# Patient Record
Sex: Male | Born: 1955
Health system: Southern US, Community
[De-identification: ages and names within clinical notes are randomized; demographics above are authoritative.]

## PROBLEM LIST (undated history)

## (undated) DIAGNOSIS — J45909 Unspecified asthma, uncomplicated: Secondary | ICD-10-CM

## (undated) DIAGNOSIS — R011 Cardiac murmur, unspecified: Secondary | ICD-10-CM

## (undated) DIAGNOSIS — H9191 Unspecified hearing loss, right ear: Secondary | ICD-10-CM

## (undated) DIAGNOSIS — I340 Nonrheumatic mitral (valve) insufficiency: Secondary | ICD-10-CM

## (undated) DIAGNOSIS — K219 Gastro-esophageal reflux disease without esophagitis: Secondary | ICD-10-CM

## (undated) DIAGNOSIS — I341 Nonrheumatic mitral (valve) prolapse: Secondary | ICD-10-CM

## (undated) DIAGNOSIS — I10 Essential (primary) hypertension: Secondary | ICD-10-CM

## (undated) DIAGNOSIS — Z974 Presence of external hearing-aid: Secondary | ICD-10-CM

## (undated) DIAGNOSIS — J309 Allergic rhinitis, unspecified: Secondary | ICD-10-CM

## (undated) DIAGNOSIS — T7840XA Allergy, unspecified, initial encounter: Secondary | ICD-10-CM

## (undated) HISTORY — PX: OSSICULAR RECONSTRUCTION: SHX2135

## (undated) HISTORY — DX: Cardiac murmur, unspecified: R01.1

## (undated) HISTORY — PX: STAPEDECTOMY: SHX2435

## (undated) HISTORY — DX: Nonrheumatic mitral (valve) insufficiency: I34.0

## (undated) HISTORY — PX: APPENDECTOMY: SHX54

## (undated) HISTORY — PX: OTHER SURGICAL HISTORY: SHX169

## (undated) HISTORY — DX: Allergic rhinitis, unspecified: J30.9

## (undated) HISTORY — DX: Allergy, unspecified, initial encounter: T78.40XA

## (undated) HISTORY — DX: Essential (primary) hypertension: I10

## (undated) HISTORY — DX: Unspecified hearing loss, right ear: H91.91

## (undated) HISTORY — DX: Nonrheumatic mitral (valve) prolapse: I34.1

## (undated) HISTORY — PX: EYE SURGERY: SHX253

## (undated) HISTORY — PX: HERNIA REPAIR: SHX51

## (undated) HISTORY — PX: TONSILLECTOMY: SUR1361

---

## 1990-03-21 ENCOUNTER — Encounter: Payer: Self-pay | Admitting: Cardiovascular Disease

## 1990-04-02 HISTORY — PX: OTHER SURGICAL HISTORY: SHX169

## 2000-01-26 ENCOUNTER — Other Ambulatory Visit: Admission: RE | Admit: 2000-01-26 | Discharge: 2000-01-26 | Payer: Self-pay | Admitting: Otolaryngology

## 2000-01-26 ENCOUNTER — Encounter (INDEPENDENT_AMBULATORY_CARE_PROVIDER_SITE_OTHER): Payer: Self-pay | Admitting: Specialist

## 2000-06-21 HISTORY — PX: OTHER SURGICAL HISTORY: SHX169

## 2006-04-02 HISTORY — PX: OTHER SURGICAL HISTORY: SHX169

## 2006-07-26 ENCOUNTER — Ambulatory Visit: Payer: Self-pay

## 2006-07-26 ENCOUNTER — Encounter: Payer: Self-pay | Admitting: Cardiology

## 2006-07-26 HISTORY — PX: OTHER SURGICAL HISTORY: SHX169

## 2006-09-19 ENCOUNTER — Ambulatory Visit (HOSPITAL_BASED_OUTPATIENT_CLINIC_OR_DEPARTMENT_OTHER): Admission: RE | Admit: 2006-09-19 | Discharge: 2006-09-19 | Payer: Self-pay | Admitting: Otolaryngology

## 2006-12-24 ENCOUNTER — Telehealth: Payer: Self-pay | Admitting: Family Medicine

## 2007-01-09 ENCOUNTER — Ambulatory Visit: Payer: Self-pay | Admitting: Cardiology

## 2007-02-24 ENCOUNTER — Ambulatory Visit: Payer: Self-pay | Admitting: Family Medicine

## 2007-02-24 DIAGNOSIS — G589 Mononeuropathy, unspecified: Secondary | ICD-10-CM | POA: Insufficient documentation

## 2007-02-24 LAB — CONVERTED CEMR LAB
ALT: 30 units/L (ref 0–53)
AST: 39 units/L — ABNORMAL HIGH (ref 0–37)
Basophils Relative: 0.3 % (ref 0.0–1.0)
Bilirubin, Direct: 0.1 mg/dL (ref 0.0–0.3)
CO2: 25 meq/L (ref 19–32)
Calcium: 10 mg/dL (ref 8.4–10.5)
Chloride: 105 meq/L (ref 96–112)
Creatinine, Ser: 0.9 mg/dL (ref 0.4–1.5)
Eosinophils Absolute: 0.2 10*3/uL (ref 0.0–0.6)
Eosinophils Relative: 2.8 % (ref 0.0–5.0)
GFR calc non Af Amer: 95 mL/min
Glucose, Bld: 83 mg/dL (ref 70–99)
HCT: 43.5 % (ref 39.0–52.0)
HDL: 65 mg/dL (ref >39.0)
Platelets: 269 10*3/uL (ref 150–400)
RBC: 4.66 M/uL (ref 4.22–5.81)
RDW: 12.4 % (ref 11.5–14.6)
Total Bilirubin: 1.4 mg/dL — ABNORMAL HIGH (ref 0.3–1.2)
Triglycerides: 124 mg/dL (ref 0–149)
VLDL: 25 mg/dL (ref 0–40)
WBC: 6.1 10*3/uL (ref 4.5–10.5)

## 2007-08-12 ENCOUNTER — Encounter: Payer: Self-pay | Admitting: Family Medicine

## 2007-10-21 ENCOUNTER — Ambulatory Visit: Payer: Self-pay | Admitting: Family Medicine

## 2007-11-11 ENCOUNTER — Ambulatory Visit: Payer: Self-pay | Admitting: Internal Medicine

## 2007-11-17 ENCOUNTER — Ambulatory Visit: Payer: Self-pay | Admitting: Internal Medicine

## 2007-11-17 HISTORY — PX: COLONOSCOPY: SHX174

## 2007-12-25 LAB — CONVERTED CEMR LAB: PSA: 0.7 ng/mL (ref 0.10–4.00)

## 2008-01-06 LAB — HM COLONOSCOPY: HM Colonoscopy: NORMAL

## 2008-06-15 ENCOUNTER — Encounter: Payer: Self-pay | Admitting: Cardiology

## 2008-06-15 ENCOUNTER — Ambulatory Visit: Payer: Self-pay

## 2008-11-25 ENCOUNTER — Telehealth: Payer: Self-pay | Admitting: Family Medicine

## 2008-11-25 DIAGNOSIS — J309 Allergic rhinitis, unspecified: Secondary | ICD-10-CM | POA: Insufficient documentation

## 2008-11-25 DIAGNOSIS — L439 Lichen planus, unspecified: Secondary | ICD-10-CM

## 2008-11-25 HISTORY — DX: Lichen planus, unspecified: L43.9

## 2008-11-25 HISTORY — DX: Allergic rhinitis, unspecified: J30.9

## 2009-06-15 HISTORY — PX: OTHER SURGICAL HISTORY: SHX169

## 2009-06-21 ENCOUNTER — Telehealth: Payer: Self-pay | Admitting: Family Medicine

## 2009-07-19 ENCOUNTER — Ambulatory Visit: Payer: Self-pay | Admitting: Family Medicine

## 2009-07-20 LAB — CONVERTED CEMR LAB
BUN: 16 mg/dL (ref 6–23)
CO2: 27 meq/L (ref 19–32)
Calcium: 9.7 mg/dL (ref 8.4–10.5)
Chloride: 105 meq/L (ref 96–112)
GFR calc non Af Amer: 94.07 mL/min (ref >60)
PSA: 0.83 ng/mL (ref 0.10–4.00)

## 2010-05-02 NOTE — Letter (Signed)
Summary: [name] Staff/Volunteer Medical Form  [facility] Staff/Volunteer Medical Form   Imported By: Beau Fanny 07/19/2009 14:45:17  _____________________________________________________________________  External Attachment:    Type:   Image     Comment:   External Document

## 2010-05-02 NOTE — Progress Notes (Signed)
Summary:  FEXOFENADINE  Phone Note Refill Request Message from:  Patient on June 21, 2009 9:00 AM  Refills Requested: Medication #1:  FEXOFENADINE HCL 180 MG  TABS 1 daily by mouth as needed.  Method Requested: Electronic Initial call taken by: Mervin Hack CMA Duncan Dull),  June 21, 2009 9:00 AM  Follow-up for Phone Call        Rx faxed to pharmacy Follow-up by: Mervin Hack CMA Duncan Dull),  June 21, 2009 9:01 AM    Prescriptions: FEXOFENADINE HCL 180 MG  TABS (FEXOFENADINE HCL) 1 daily by mouth as needed  #30 x 11   Entered by:   Mervin Hack CMA (AAMA)   Authorized by:   Shaune Leeks MD   Signed by:   Mervin Hack CMA (AAMA) on 06/21/2009   Method used:   Electronically to        [facility]* (retail)       [address]       [place], [place]  [address]       Ph: [phone]       Fax: [phone]   RxID:   [id number]

## 2010-05-02 NOTE — Assessment & Plan Note (Signed)
Summary: CPX/CLE   Vital Signs:  Patient profile:   55 year old male Height:      73 inches Weight:      202.13 pounds BMI:     26.76 Temp:     97.8 degrees F oral Pulse rate:   68 / minute Pulse rhythm:   regular BP sitting:   128 / 78  (left arm) Cuff size:   regular  Vitals Entered By: Sydell Axon LPN (July 19, 2009 2:08 PM) CC: 30 Minute checkup, had a colonoscopy 08/09 by Dr. Marina Goodell   History of Present Illness: Pt here for Comp Exam, no complaints. He has significant seasonal allergies. He has been using all three OTC meds and is using Singulair with good results.  Preventive Screening-Counseling & Management  Alcohol-Tobacco     Smoking Status: never  Allergies: No Known Drug Allergies  Past History:  Past Surgical History: Tonsillectomy age 76 Ossicular reconstruction of the right ear Echo- positive for MVP, markedly myxomatous, marked prolapse  TVP with Regurg (03/1990) Bilateral laparoscopic hernia repairs (1992) Echo- mitral valve thickened, myxomatous vs degenerative, moderate MVP, moderate MR (06/21/2000) Echo- LVF normal, EF 55-65%, Triv  AR mod MR, marked posterior leaflet MVP (07/26/2006) COLONOSCOPY NML (DR PERRY) 11/17/07     10 YRS Nasal Septoplasty (Dr Jearld Fenton) 2008 ECHO No change (EF 55-60% Mild Myxomatous Mitral Valve   Mod MVP  Mod MR   Mild LAE  (06/15/2009)  Family History: Father: A 85 diabetes9diet controlled) , bypass graft polyps Vascular Dz Mother: A 82 hypertension, breast cancer, polyps Brother A 14 Sister A 54 Hyperparathyroidism Sister A 50 Breast Ca  Review of Systems General:  Denies chills, fatigue, fever, sweats, weakness, and weight loss. Eyes:  Denies blurring, discharge, and eye pain. ENT:  Complains of decreased hearing; denies ear discharge and ringing in ears; mild chronic. CV:  Denies chest pain or discomfort, fainting, fatigue, palpitations, shortness of breath with exertion, swelling of feet, and swelling of hands. Resp:   Denies cough, shortness of breath, and wheezing; seasonal wheezing occas with allergies none this year.Marland Kitchen GI:  Denies abdominal pain, bloody stools, change in bowel habits, constipation, dark tarry stools, diarrhea, indigestion, loss of appetite, nausea, vomiting, vomiting blood, and yellowish skin color. GU:  Denies discharge, dysuria, nocturia, and urinary frequency. MS:  Complains of joint pain and muscle aches; denies low back pain, cramps, and stiffness; after workouts. Derm:  Denies dryness, itching, and rash. Neuro:  Denies numbness, poor balance, tingling, and tremors.  Physical Exam  General:  Well-developed,well-nourished,in no acute distress; alert,appropriate and cooperative throughout examination Head:  Normocephalic and atraumatic without obvious abnormalities. No apparent alopecia but mild male pattern  balding. Eyes:  Conjunctiva clear bilaterally.  Ears:  External ear exam shows no significant lesions or deformities.  Otoscopic examination reveals clear canals, tympanic membranes are intact bilaterally without bulging, retraction, inflammation or discharge. Hearing is grossly normal bilaterally. Nose:  External nasal examination shows no deformity or inflammation. Nasal mucosa are pink and moist without lesions or exudates.  Mouth:  Oral mucosa and oropharynx without lesions or exudates.  Teeth in good repair. Neck:  No deformities, masses, or tenderness noted. Chest Wall:  No deformities, masses, tenderness or gynecomastia noted. Breasts:  No masses or gynecomastia noted Lungs:  Normal respiratory effort, chest expands symmetrically. Lungs are clear to auscultation, no crackles or wheezes. Heart:  Normal rate and regular rhythm. S1 and S2 normal without gallop, click, rub or other extra sounds. II-III/VI Sys  murmur heard best LLSB but precordially throughout Abdomen:  Bowel sounds positive,abdomen soft and non-tender without masses, organomegaly or hernias noted. Rectal:   Declined Genitalia:  Testes bilaterally descended without nodularity, tenderness or masses. No scrotal masses or lesions. No penis lesions or urethral discharge. Prostate:  Declined Msk:  No deformity or scoliosis noted of thoracic or lumbar spine.   Pulses:  R and L carotid,radial,femoral,dorsalis pedis and posterior tibial pulses are full and equal bilaterally Extremities:  No clubbing, cyanosis, edema, or deformity noted with normal full range of motion of all joints.   Neurologic:  No cranial nerve deficits noted. Station and gait are normal. Plantar reflexes are down-going bilaterally. DTRs are symmetrical throughout. Sensory, motor and coordinative functions appear intact. Skin:  Intact without suspicious lesions or rashes Cervical Nodes:  No lymphadenopathy noted Inguinal Nodes:  No significant adenopathy Psych:  Cognition and judgment appear intact. Alert and cooperative with normal attention span and concentration. No apparent delusions, illusions, hallucinations   Impression & Recommendations:  Problem # 1:  HEALTH MAINTENANCE EXAM (ICD-V70.0)  Orders: TLB-Renal Function Panel (80069-RENAL)  Reviewed preventive care protocols, scheduled due services, and updated immunizations.  Problem # 2:  DEVIATED  SEPTUM/ NASAL HYPERTROPHY (ICD-470) Assessment: Improved Had septoplasty 2008 with significant improvement.  Problem # 3:  UNSPEC HEARING LOSS ( FUNCTIONAL DEAFNESS AD) (ICD-389.9) Assessment: Unchanged Stable without worsening.  Problem # 4:  MITRAL REGURGITATION (ECHO PROVEN) (ICD-396.3) Assessment: Unchanged Recent ECHO actually minimally improved. Certainly no progression.  Problem # 5:  ALLERGIC RHINITIS (ICD-477.9) Assessment: Improved  Taking all three OTC antihistamines and usin Singulair with good results. His updated medication list for this problem includes:    Fexofenadine Hcl 180 Mg Tabs (Fexofenadine hcl) .Marland Kitchen... 1 daily by mouth as needed    Loratadine 10  Mg Tabs (Loratadine) .Marland Kitchen... Take 2 by mouth daily as needed    Zyrtec Allergy 10 Mg Tabs (Cetirizine hcl) .Marland Kitchen... Take one by mouth daily as needed  Discussed use of allergy medications and environmental measures.   Problem # 6:  SPECIAL SCREENING MALIGNANT NEOPLASM OF PROSTATE (ICD-V76.44) Assessment: Unchanged PSA drawn today. Orders: TLB-PSA (Prostate Specific Antigen) (84153-PSA)  Problem # 7:  MONONEURITIS OF UNSPECIFIED SITE (ICD-355.9) Assessment: Unchanged Stable.  Complete Medication List: 1)  Fexofenadine Hcl 180 Mg Tabs (Fexofenadine hcl) .Marland Kitchen.. 1 daily by mouth as needed 2)  Loratadine 10 Mg Tabs (Loratadine) .... Take 2 by mouth daily as needed 3)  Zyrtec Allergy 10 Mg Tabs (Cetirizine hcl) .... Take one by mouth daily as needed 4)  Singulair 10 Mg Tabs (Montelukast sodium) .... Take one by mouth daily 5)  Ibuprofen 800 Mg Tabs (Ibuprofen) .... Take one by mouth at bedtime  Patient Instructions: 1)  RTC as needed. 2)  Form for [employer] signed.  Current Allergies (reviewed today): No known allergies

## 2010-08-15 NOTE — Assessment & Plan Note (Signed)
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   I evaluated [name] informally in 2002 at which time he had a 2D  echocardiogram.  This showed a thickened mitral valve with a hooded and  redundant myxomatous character.  There was moderate mitral valve  prolapse.  He had moderate mitral regurgitation at the time.  Left  ventricular size was normal as was function and his left atrial size was  also minimally dilated at 4.07 sonometers.   He is totally asymptomatic.  He is in the best cardiovascular shape  ever, he says.  He bicycles, plays full court basketball with 20-year-  olds, treadmills and lifts weights.  He has no symptoms whatsoever.  He  has had no palpitations, shortness of breath, dyspnea on exertion,  orthopnea, PND, peripheral edema, presyncope or syncope.   PAST MEDICAL HISTORY:  HE HAS NO KNOWN DYE ALLERGIES.  HE IS NOT  ALLERGIC TO ANY MEDICATIONS.   He does not smoke.  He does drink alcohol infrequently.   He is on ibuprofen 800 mg nightly.  He takes loratadine p.r.n. for  allergies.   SURGICAL HISTORY:  1. Appendectomy 1971.  2. T&A in 1961.  3. Multiple ear procedures.  4. Bilateral inguinal hernia repairs in 1992.   SOCIAL HISTORY:  He is a Development worker, community.  He follows mostly the [facility]  Diet.  He is married and has 2 children.   REVIEW OF SYSTEMS:  Other than some cold induced asthma and some  allergies is totally negative.   EXAMINATION:  He is in acute distress, he is alert and oriented.  Blood  pressure is 140/76, his pulse is 62 and regular, his weight is 191, he  is 6 foot 2 inches.  HEENT:   Normocephalic/atraumatic, PERRLA, facial symmetry is normal.  Carotid upstrokes were equal bilaterally without obvious bruit.  He does  have some low grade early systolic sounds at the base of his neck.  Thyroid is not enlarged, trachea is midline.  LUNGS:  Clear.  HEART:  Reveals a nondisplaced PMI.  He has a very loud 3/6 systolic  murmur.  There was no diastolic inflow murmur.  It was difficult to hear  S2 split.  There is no gallop.  ABDOMINAL:  Soft, good bowel sounds.  EXTREMITIES:  Reveal no edema, pulses are intact.  NEURO:  Exam is grossly intact.   EKG shows sinus rhythm with early repolarization.   A 2D echocardiogram was repeated on July 26, 2006.  This has been  reviewed with [name] and also [name], 2 of my  colleagues.  His left ventricular end systolic dimension is 26.4 mm, his  left atrial size is increased to 45 mm, he has  moderate mitral valve  regurgitation.  From the long axis view it appears to be in the moderate  to severe range.  He has marked mitral valve prolapse, particularly of  the posterior leaflet.  His anterior leaflet also prolapses.  He has  myxomatous degeneration as pointed out earlier.  There is no pulmonary  hypertension.   ASSESSMENT AND PLAN:  I have had about a 20 minute discussion with Dr.  [name].  He admits to being conservative by nature and would like not to  have to have surgery any sooner than is necessary.  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  We talked about various options here in [place] for  mitral valve repair.  He prefers a minimally invasive procedure as  opposed to sternotomy.   At the present time he would like to proceed with watchful waiting which  I agree with.  I have advised an annual transthoracic echo for close  observation.  I do not think he will become symptomatic but if he does  he will let me know.   Will schedule followup in 1  year.     Ryan C. Daleen Squibb, MD, [facility]  Electronically Signed    TCW/MedQ  DD: 01/09/2007  DT: 01/09/2007  Job #: [id number]   cc:   Ryan Silence, MD

## 2010-08-15 NOTE — Op Note (Signed)
[name], [name]              ACCOUNT NO.:  [id number]   MEDICAL RECORD NO.:  [id number]          PATIENT TYPE:  AMB   LOCATION:  DSC                          FACILITY:  [facility]   PHYSICIAN:  Suzanna Obey, M.D.       DATE OF BIRTH:  [birth year]   DATE OF PROCEDURE:  09/19/2006  DATE OF DISCHARGE:                               OPERATIVE REPORT   PREOPERATIVE DIAGNOSIS:  Deviated septum and turbinate hypertrophy.   POSTOPERATIVE DIAGNOSIS:  Deviated septum and turbinate hypertrophy.   SURGICAL PROCEDURE:  Septoplasty and submucous resection of inferior  turbinate.   ANESTHESIA:  General.   ESTIMATED BLOOD LOSS:  Approximately 10 mL.   INDICATION:  This is a 55 year old who has had significant nasal  obstructions and congestion that has been refractory to medical therapy.  He is ready to proceed with surgery.  He was informed of the risks and  benefits of the procedure as well as options.  All his questions were  answered and consent was obtained.   OPERATION:  The patient was taken to the operating room and placed in  the supine position.  After adequate general endotracheal tube  anesthesia, was placed in the supine position, prepped and draped in the  usual sterile manner.  The oxymetazoline pledgets were placed into the  nose bilaterally and then the septum and inferior turbinates were  injected with 1% lidocaine with 1:100,000 epinephrine.  The right  hemitransfixion incision was performed, raising the mucoperichondrial  and ostial flap.  The cartilage and septum were severely deviated to the  right.  The cartilage was removed with the freer elevator posterior to  the 2-cm strut.  The deviated portion of the bone removed with the  Jansen-Middleton forceps.  Once this bone was trimmed as well as the  cartilage, this corrected the septal deflection.  The turbinates were  infractured.  A midline incision made with a 15 blade.  The mucosal flap  elevated superiorly and the  inferior mucosa and bone were removed with a  turbinate scissors.  The edge was cauterized with the suction cautery  and both flaps were laid down over the raw surface and outfractured.  Hemitransfixion incision closed with interrupted 4-0 chromic and a 4-0  plain gut quilting stitch placed through the septum.  The nasopharynx  and oral cavity and oropharynx were suctioned out of all blood and  debris under direct visualization.  The Telfa roll soaked in bacitracin  were placed into the nose bilaterally and secured with a 3-0 nylon.  The  patient was awakened and brought to recovery in stable condition.  Counts were correct.           ______________________________  Suzanna Obey, M.D.     JB/MEDQ  D:  09/19/2006  T:  09/19/2006  Job:  [id number]

## 2010-09-14 ENCOUNTER — Encounter: Payer: Self-pay | Admitting: Cardiovascular Disease

## 2010-12-28 DIAGNOSIS — S76119A Strain of unspecified quadriceps muscle, fascia and tendon, initial encounter: Secondary | ICD-10-CM | POA: Insufficient documentation

## 2011-01-03 ENCOUNTER — Encounter: Payer: Self-pay | Admitting: Family Medicine

## 2011-01-10 ENCOUNTER — Encounter: Payer: Self-pay | Admitting: *Deleted

## 2011-04-03 HISTORY — PX: OTHER SURGICAL HISTORY: SHX169

## 2011-07-02 ENCOUNTER — Telehealth: Payer: Self-pay

## 2011-07-02 ENCOUNTER — Other Ambulatory Visit: Payer: Self-pay | Admitting: Family Medicine

## 2011-07-02 MED ORDER — MONTELUKAST SODIUM 10 MG PO TABS: 10.0000 mg | ORAL_TABLET | Freq: Every day | ORAL | Status: DC

## 2011-07-02 NOTE — Telephone Encounter (Signed)
[facility] faxed request for prior authorization for Montelukast sodium 10 mg. I called [phone]  And [employer] [facility] faxed prior authorization form which is on your desk in the in box to complete and fax back.

## 2011-07-02 NOTE — Telephone Encounter (Signed)
done

## 2011-07-05 ENCOUNTER — Encounter: Payer: Self-pay | Admitting: Family Medicine

## 2011-07-05 NOTE — Progress Notes (Signed)
Received approval letter for Montelukast and was faxed to [facility]. Prior auth form and approval letter sent to scanned.

## 2011-10-11 ENCOUNTER — Other Ambulatory Visit: Payer: Self-pay | Admitting: Family Medicine

## 2011-10-11 MED ORDER — IPRATROPIUM BROMIDE 0.03 % NA SOLN: 2.0000 | Freq: Two times a day (BID) | NASAL | Status: DC

## 2011-10-11 NOTE — Progress Notes (Signed)
Prn prior to bike riding. Very reasonable. Discussed face to face.

## 2011-11-29 ENCOUNTER — Other Ambulatory Visit: Payer: Self-pay | Admitting: *Deleted

## 2011-11-29 DIAGNOSIS — I341 Nonrheumatic mitral (valve) prolapse: Secondary | ICD-10-CM

## 2011-12-18 ENCOUNTER — Other Ambulatory Visit (INDEPENDENT_AMBULATORY_CARE_PROVIDER_SITE_OTHER): Payer: BC Managed Care – PPO

## 2011-12-18 ENCOUNTER — Other Ambulatory Visit: Payer: Self-pay

## 2011-12-18 DIAGNOSIS — I341 Nonrheumatic mitral (valve) prolapse: Secondary | ICD-10-CM

## 2011-12-18 DIAGNOSIS — I059 Rheumatic mitral valve disease, unspecified: Secondary | ICD-10-CM

## 2012-03-19 ENCOUNTER — Other Ambulatory Visit: Payer: Self-pay | Admitting: *Deleted

## 2012-03-19 MED ORDER — MONTELUKAST SODIUM 10 MG PO TABS: 10.0000 mg | ORAL_TABLET | Freq: Every day | ORAL | Status: DC

## 2012-04-10 ENCOUNTER — Encounter: Payer: Self-pay | Admitting: Family Medicine

## 2012-04-10 ENCOUNTER — Ambulatory Visit (INDEPENDENT_AMBULATORY_CARE_PROVIDER_SITE_OTHER): Payer: BC Managed Care – PPO | Admitting: Family Medicine

## 2012-04-10 VITALS — BP 122/70 | HR 62 | Temp 98.2°F | Wt 202.5 lb

## 2012-04-10 DIAGNOSIS — Z125 Encounter for screening for malignant neoplasm of prostate: Secondary | ICD-10-CM

## 2012-04-10 DIAGNOSIS — Z79899 Other long term (current) drug therapy: Secondary | ICD-10-CM

## 2012-04-10 DIAGNOSIS — Z13220 Encounter for screening for lipoid disorders: Secondary | ICD-10-CM

## 2012-04-10 DIAGNOSIS — Z131 Encounter for screening for diabetes mellitus: Secondary | ICD-10-CM

## 2012-04-10 DIAGNOSIS — Z0000 Encounter for general adult medical examination without abnormal findings: Secondary | ICD-10-CM

## 2012-04-10 LAB — CBC WITH DIFFERENTIAL/PLATELET
Eosinophils Absolute: 0.1 10*3/uL (ref 0.0–0.7)
Eosinophils Relative: 0.9 % (ref 0.0–5.0)
HCT: 41.9 % (ref 39.0–52.0)
Lymphs Abs: 2 10*3/uL (ref 0.7–4.0)
MCHC: 33.7 g/dL (ref 30.0–36.0)
MCV: 90.5 fl (ref 78.0–100.0)
Monocytes Absolute: 0.5 10*3/uL (ref 0.1–1.0)
Neutrophils Relative %: 60.3 % (ref 43.0–77.0)
Platelets: 229 10*3/uL (ref 150.0–400.0)
WBC: 6.7 10*3/uL (ref 4.5–10.5)

## 2012-04-10 LAB — BASIC METABOLIC PANEL
BUN: 16 mg/dL (ref 6–23)
CO2: 28 mEq/L (ref 19–32)
Chloride: 102 mEq/L (ref 96–112)
Creatinine, Ser: 0.9 mg/dL (ref 0.4–1.5)
Glucose, Bld: 93 mg/dL (ref 70–99)
Potassium: 4.2 mEq/L (ref 3.5–5.1)

## 2012-04-10 LAB — LIPID PANEL
Cholesterol: 230 mg/dL — ABNORMAL HIGH (ref 0–200)
HDL: 68 mg/dL (ref >39.00)
Total CHOL/HDL Ratio: 3
Triglycerides: 113 mg/dL (ref 0.0–149.0)

## 2012-04-10 LAB — HEPATIC FUNCTION PANEL
ALT: 35 U/L (ref 0–53)
Total Bilirubin: 1.2 mg/dL (ref 0.3–1.2)
Total Protein: 6.9 g/dL (ref 6.0–8.3)

## 2012-04-10 MED ORDER — TRIAMCINOLONE ACETONIDE 0.1 % EX CREA: 1.0000 "application " | TOPICAL_CREAM | Freq: Two times a day (BID) | CUTANEOUS | Status: DC

## 2012-04-10 NOTE — Progress Notes (Signed)
[facility] [address] [place] [address] Phone: [phone] Fax: [phone]  Date:  04/10/2012   Name:  [name]   DOB:  09/18/1955   MRN:  [id number] Gender: male Age: 57 y.o.  PCP:  Hannah Beat, MD  Evaluating MD: Hannah Beat, MD   Chief Complaint: Annual Exam   History of Present Illness:  [name] is a 57 y.o. pleasant patient who presents with the following:  Preventative Health Maintenance Visit: He is overall feeling well.  Health Maintenance Summary Reviewed and updated, unless pt declines services.  Tobacco History Reviewed. None. Alcohol: about 4 glasses of wine a night. Exercise Habits: vigorous exercise 5 days a week. Enjoys cycling. STD concerns: no risk or activity to increase risk Drug Use: None  Health Maintenance  Topic Date Due  . Influenza Vaccine  12/01/2012  . Tetanus/tdap  10/20/2017  . Colonoscopy  11/06/2017    Labs reviewed with the patient - obtained during OV.  Results for orders placed in visit on 04/10/12  BASIC METABOLIC PANEL      Component Value Range   Sodium 136  135 - 145 mEq/L   Potassium 4.2  3.5 - 5.1 mEq/L   Chloride 102  96 - 112 mEq/L   CO2 28  19 - 32 mEq/L   Glucose, Bld 93  70 - 99 mg/dL   BUN 16  6 - 23 mg/dL   Creatinine, Ser 0.9  0.4 - 1.5 mg/dL   Calcium 9.8  8.4 - 86.5 mg/dL   GFR 78.46  >96.29 mL/min  CBC WITH DIFFERENTIAL      Component Value Range   WBC 6.7  4.5 - 10.5 K/uL   RBC 4.63  4.22 - 5.81 Mil/uL   Hemoglobin 14.1  13.0 - 17.0 g/dL   HCT 52.8  41.3 - 24.4 %   MCV 90.5  78.0 - 100.0 fl   MCHC 33.7  30.0 - 36.0 g/dL   RDW 01.0  27.2 - 53.6 %   Platelets 229.0  150.0 - 400.0 K/uL   Neutrophils Relative 60.3  43.0 - 77.0 %   Lymphocytes Relative 30.6  12.0 - 46.0 %   Monocytes Relative 7.8  3.0 - 12.0 %   Eosinophils Relative 0.9  0.0 - 5.0 %   Basophils Relative 0.4  0.0 - 3.0 %   Neutro Abs 4.0  1.4 - 7.7 K/uL   Lymphs Abs 2.0  0.7 - 4.0  K/uL   Monocytes Absolute 0.5  0.1 - 1.0 K/uL   Eosinophils Absolute 0.1  0.0 - 0.7 K/uL   Basophils Absolute 0.0  0.0 - 0.1 K/uL  HEPATIC FUNCTION PANEL      Component Value Range   Total Bilirubin 1.2  0.3 - 1.2 mg/dL   Bilirubin, Direct 0.1  0.0 - 0.3 mg/dL   Alkaline Phosphatase 47  39 - 117 U/L   AST 37  0 - 37 U/L   ALT 35  0 - 53 U/L   Total Protein 6.9  6.0 - 8.3 g/dL   Albumin 4.4  3.5 - 5.2 g/dL  LIPID PANEL      Component Value Range   Cholesterol 230 (*) 0 - 200 mg/dL   Triglycerides 644.0  0.0 - 149.0 mg/dL   HDL 34.74  >25.95 mg/dL   VLDL 63.8  0.0 - 75.6 mg/dL   Total CHOL/HDL Ratio 3    PSA  Component Value Range   PSA 0.68  0.10 - 4.00 ng/mL  LDL CHOLESTEROL, DIRECT      Component Value Range   Direct LDL 130.1       Brother - prostate cancer  Doing elliptical  Patient Active Problem List  Diagnosis  . ALLERGIC RHINITIS  . Lichen planus  . Quadriceps tendon rupture,left  . Mitral regurgitation, myxomatous  . Mitral valve prolapse    Past Medical History  Diagnosis Date  . ALLERGIC RHINITIS 11/25/2008    Qualifier: Diagnosis of  By: Hetty Ely MD, [name]   . Mitral regurgitation, myxomatous 04/11/2012  . Mitral valve prolapse 04/11/2012    Severe, annual echos    Past Surgical History  Procedure Date  . Tonsillectomy     age 56  . Ossicular reconstruction     right ear  . Echo (other)     positive for MVP, markedly myxomatous, marked prolapse TVP with Regurg (03/1990)  . Bilateral laparoscopic hernia repairs 1992  . Echo (other) 06/21/2000    mitral valve thickened, myxomatous vs degenerative, moderate MVP, moderate MR  . Echo (other) 07/26/2006    LVF normal, EF 55-65%, Triv AR mod MR, marked posterior leaflet MVP  . Colonoscopy 11/17/07    Dr. Marina Goodell  . Nasal septoplasty (other) 2008    Dr. Jearld Fenton  . Echo (other) 06/15/2009    no change (EF 55-60% Mild Myxomatous Mitral Valve Mod MVP mod MR Mild LAE)    History  Substance Use  Topics  . Smoking status: Never Smoker   . Smokeless tobacco: Not on file  . Alcohol Use: 15.0 oz/week    25 Glasses of wine per week    Family History  Problem Relation Age of Onset  . Breast cancer Mother   . Hypertension Mother   . Diabetes Father     diet controlled, bypass fraft polyps vascular Dz.  . Hyperthyroidism Sister   . Breast cancer Sister     No Known Allergies  Current Outpatient Prescriptions on File Prior to Visit  Medication Sig Dispense Refill  . cetirizine (ZYRTEC ALLERGY) 10 MG tablet Take 10 mg by mouth daily.        . fexofenadine (ALLEGRA) 180 MG tablet Take 180 mg by mouth daily.        Marland Kitchen ipratropium (ATROVENT) 0.03 % nasal spray Place 2 sprays into the nose every 12 (twelve) hours.  30 mL  1  . loratadine (CLARITIN) 10 MG tablet Take 10 mg by mouth daily.        . montelukast (SINGULAIR) 10 MG tablet Take 1 tablet (10 mg total) by mouth daily.  90 tablet  3  . ibuprofen (ADVIL,MOTRIN) 800 MG tablet Take 800 mg by mouth at bedtime.           Review of Systems:   General: Denies fever, chills, sweats. No significant weight loss. Eyes: Denies blurring,significant itching ENT: Denies earache, sore throat, and hoarseness. Cardiovascular: Denies chest pains, palpitations, dyspnea on exertion Respiratory: Denies cough, dyspnea at rest,wheeezing Breast: no concerns about lumps GI: Denies nausea, vomiting, diarrhea, constipation, change in bowel habits, abdominal pain, melena, hematochezia GU: Denies penile discharge, ED, urinary flow / outflow problems. No STD concerns. Musculoskeletal: QUAD RUPTURE, REPAIR LAST YEAR, DID GREAT, EXCELLENT RECOVERY WITH STRONG BIKING SEASON Derm: occ rash, TAC will help Neuro: Denies  paresthesias, frequent falls, frequent headaches Psych: Denies depression, anxiety Endocrine: Denies cold intolerance, heat intolerance, polydipsia Heme: Denies enlarged lymph nodes Allergy: intermittent  allergies, controlled with  meds   Physical Examination: BP 122/70  Pulse 62  Temp 98.2 F (36.8 C) (Oral)  Wt 202 lb 8 oz (91.853 kg)  SpO2 98%  Ideal Body Weight:     Wt Readings from Last 3 Encounters:  04/10/12 202 lb 8 oz (91.853 kg)  07/19/09 202 lb 2.1 oz (91.686 kg)    GEN: well developed, well nourished, no acute distress Eyes: conjunctiva and lids normal, PERRLA, EOMI ENT: TM R with extensive scarring, nares clear, oral exam WNL Neck: supple, no lymphadenopathy, no thyromegaly, no JVD Pulm: clear to auscultation and percussion, respiratory effort normal CV: regular rate and rhythm, 3-4/6 murmur, heard on R and L sides of upper sternum Chest: no scars, masses GI: soft, non-tender; no hepatosplenomegaly, masses; active bowel sounds all quadrants GU: deferred Lymph: no cervical, axillary or inguinal adenopathy MSK: gait normal, muscle tone and strength WNL, no joint swelling, effusions, discoloration, crepitus  SKIN: clear, good turgor, color WNL, no rashes, lesions, or ulcerations Neuro: normal mental status, normal strength, sensation, and motion Psych: alert; oriented to person, place and time, normally interactive and not anxious or depressed in appearance.   Assessment and Plan:  1. Routine general medical examination at a health care facility    2. Screening for diabetes mellitus  Basic metabolic panel  3. Screening for lipoid disorders  Lipid panel  4. Special screening for malignant neoplasm of prostate  PSA  5. Encounter for long-term (current) use of other medications  CBC with Differential, Hepatic function panel   The patient's preventative maintenance and recommended screening tests for an annual wellness exam were reviewed in full today. Brought up to date unless services declined.  Counselled on the importance of diet, exercise, and its role in overall health and mortality. The patient's FH and SH was reviewed, including their home life, tobacco status, and drug and alcohol  status.   Overall, he is doing well. Check basic labs.  Recommended cont exercise and healthy habits.  Orders Today:  Orders Placed This Encounter  Procedures  . Basic metabolic panel  . CBC with Differential  . Hepatic function panel  . Lipid panel  . PSA  . LDL cholesterol, direct    Updated Medication List: (Includes new medications, updates to list, dose adjustments) Meds ordered this encounter  Medications  . naproxen sodium (ANAPROX) 220 MG tablet    Sig: Take 220 mg by mouth at bedtime.  Marland Kitchen DISCONTD: triamcinolone cream (KENALOG) 0.1 %    Sig: Apply 1 application topically 2 (two) times daily.  Marland Kitchen triamcinolone cream (KENALOG) 0.1 %    Sig: Apply 1 application topically 2 (two) times daily.    Dispense:  45 g    Refill:  1    Medications Discontinued: Medications Discontinued During This Encounter  Medication Reason  . triamcinolone cream (KENALOG) 0.1 % Reorder     Hannah Beat, MD

## 2012-04-11 ENCOUNTER — Encounter: Payer: Self-pay | Admitting: Family Medicine

## 2012-04-11 DIAGNOSIS — H9191 Unspecified hearing loss, right ear: Secondary | ICD-10-CM

## 2012-04-11 DIAGNOSIS — I341 Nonrheumatic mitral (valve) prolapse: Secondary | ICD-10-CM

## 2012-04-11 DIAGNOSIS — I340 Nonrheumatic mitral (valve) insufficiency: Secondary | ICD-10-CM

## 2012-04-11 HISTORY — DX: Nonrheumatic mitral (valve) insufficiency: I34.0

## 2012-04-11 HISTORY — DX: Nonrheumatic mitral (valve) prolapse: I34.1

## 2012-04-11 HISTORY — DX: Unspecified hearing loss, right ear: H91.91

## 2012-12-16 ENCOUNTER — Telehealth: Payer: Self-pay | Admitting: *Deleted

## 2012-12-16 NOTE — Telephone Encounter (Signed)
Lmom to call our office, its time to schedule an appointment for an echo.

## 2013-02-05 ENCOUNTER — Other Ambulatory Visit: Payer: Self-pay

## 2013-03-27 ENCOUNTER — Other Ambulatory Visit: Payer: Self-pay | Admitting: Family Medicine

## 2013-03-27 MED ORDER — MONTELUKAST SODIUM 10 MG PO TABS: 10.0000 mg | ORAL_TABLET | Freq: Every day | ORAL | Status: DC

## 2013-06-22 ENCOUNTER — Other Ambulatory Visit: Payer: Self-pay | Admitting: *Deleted

## 2013-06-22 MED ORDER — IPRATROPIUM BROMIDE 0.03 % NA SOLN: 2.0000 | Freq: Two times a day (BID) | NASAL | Status: DC

## 2013-06-22 NOTE — Telephone Encounter (Signed)
Ok to fill 

## 2013-06-24 ENCOUNTER — Ambulatory Visit (INDEPENDENT_AMBULATORY_CARE_PROVIDER_SITE_OTHER): Payer: BC Managed Care – PPO | Admitting: *Deleted

## 2013-06-24 DIAGNOSIS — Z23 Encounter for immunization: Secondary | ICD-10-CM

## 2013-07-08 ENCOUNTER — Ambulatory Visit: Payer: BC Managed Care – PPO

## 2013-10-08 ENCOUNTER — Encounter: Payer: Self-pay | Admitting: Family Medicine

## 2013-10-08 ENCOUNTER — Ambulatory Visit (INDEPENDENT_AMBULATORY_CARE_PROVIDER_SITE_OTHER): Payer: BC Managed Care – PPO | Admitting: Family Medicine

## 2013-10-08 VITALS — BP 140/86 | HR 63 | Temp 98.4°F | Ht 72.5 in | Wt 201.0 lb

## 2013-10-08 DIAGNOSIS — I340 Nonrheumatic mitral (valve) insufficiency: Secondary | ICD-10-CM

## 2013-10-08 DIAGNOSIS — Z0000 Encounter for general adult medical examination without abnormal findings: Secondary | ICD-10-CM

## 2013-10-08 DIAGNOSIS — I059 Rheumatic mitral valve disease, unspecified: Secondary | ICD-10-CM

## 2013-10-08 NOTE — Progress Notes (Signed)
Pre visit review using our clinic review tool, if applicable. No additional management support is needed unless otherwise documented below in the visit note. 

## 2013-10-08 NOTE — Progress Notes (Signed)
[place] [address] Phone: [phone] Fax: [phone]  Patient ID: [name] MRN: [id number], DOB: 01/08/1956, 58 y.o. Date of Encounter: 10/08/2013  Primary Physician:  Owens Loffler, MD   Chief Complaint: Annual Exam   Subjective:   History of Present Illness:  [name] is a 58 y.o. pleasant patient who presents with the following:  Preventative Health Maintenance Visit:  Health Maintenance Summary Reviewed and updated, unless pt declines services.  Tobacco History Reviewed. Alcohol: 2-3 glasses of wine most nights Exercise Habits: routine cyclist STD concerns: no risk or activity to increase risk Drug Use: None Encouraged self-testicular check  Health Maintenance  Topic Date Due  . Influenza Vaccine  10/31/2013  . Tetanus/tdap  10/20/2017  . Colonoscopy  09/13/2020    Immunization History  Administered Date(s) Administered  . Hepatitis A, Adult 06/24/2013  . Influenza Split 12/31/2012  . Td 10/21/2007    Patient Active Problem List   Diagnosis Date Noted  . Mitral regurgitation, myxomatous 04/11/2012  . Mitral valve prolapse 04/11/2012  . Hearing loss, right 04/11/2012  . Quadriceps tendon rupture,left 12/28/2010  . ALLERGIC RHINITIS 11/25/2008  . Lichen planus 22/05/5425   Past Medical History  Diagnosis Date  . ALLERGIC RHINITIS 11/25/2008    Qualifier: Diagnosis of  By: Council Mechanic MD, Hilaria Ota   . Mitral regurgitation, myxomatous 04/11/2012  . Mitral valve prolapse 04/11/2012    Severe, annual echos  . Hearing loss, right 04/11/2012    Multiple surgeries in the past, partial hearing loss   Past Surgical History  Procedure Laterality Date  . Tonsillectomy      age 56  . Ossicular reconstruction      right ear  . Echo (other)      positive for MVP, markedly myxomatous, marked prolapse TVP with Regurg (03/1990)  . Bilateral laparoscopic hernia repairs  1992  . Echo (other)  06/21/2000    mitral valve  thickened, myxomatous vs degenerative, moderate MVP, moderate MR  . Echo (other)  07/26/2006    LVF normal, EF 55-65%, Triv AR mod MR, marked posterior leaflet MVP  . Colonoscopy  11/17/07    Dr. Henrene Pastor  . Nasal septoplasty (other)  2008    Dr. Janace Hoard  . Echo (other)  06/15/2009    no change (EF 55-60% Mild Myxomatous Mitral Valve Mod MVP mod MR Mild LAE)   History   Social History  . Marital Status: Married    Spouse Name: Manuela Schwartz    Number of Children: 2  . 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 Smoking status: Never Smoker   . Smokeless tobacco: Never Used  . Alcohol Use: 15.0 oz/week    25 Glasses of wine per week  . Drug Use: No  . Sexual Activity: Yes    Partners: Female   Other Topics Concern  . Not on file   Social History Narrative   Physician.   2 Children   Daughter: Vladimir Creeks   1 son   Family History  Problem Relation Age of Onset  . Breast cancer Mother   . Hypertension Mother   . Diabetes Father     diet controlled, bypass fraft polyps vascular Dz.  . Hyperthyroidism Sister   . Breast cancer Sister   . Prostate cancer Brother    No Known Allergies  Medication list has been reviewed  and updated.  Review of Systems:  General: Denies fever, chills, sweats. No significant weight loss. Eyes: Denies blurring,significant itching ENT: Denies earache, sore throat, and hoarseness. Cardiovascular: Denies chest pains, palpitations, dyspnea on exertion Respiratory: Denies cough, dyspnea at rest,wheeezing Breast: no concerns about lumps GI: Denies nausea, vomiting, diarrhea, constipation, change in bowel habits, abdominal pain, melena, hematochezia GU: Denies penile discharge, ED, urinary flow / outflow problems. No STD concerns. Musculoskeletal: Denies back pain, joint pain Derm: Denies rash, itching Neuro: Denies  paresthesias, frequent falls, frequent headaches Psych:  Denies depression, anxiety Endocrine: Denies cold intolerance, heat intolerance, polydipsia Heme: Denies enlarged lymph nodes Allergy: No hayfever  Objective:   Physical Examination: BP 140/86  Pulse 63  Temp(Src) 98.4 F (36.9 C) (Oral)  Ht 6' 0.5" (1.842 m)  Wt 201 lb (91.173 kg)  BMI 26.87 kg/m2  SpO2 97% Ideal Body Weight: Weight in (lb) to have BMI = 25: 186.5  No exam data present  GEN: well developed, well nourished, no acute distress Eyes: conjunctiva and lids normal, PERRLA, EOMI ENT: TM clear, nares clear, oral exam WNL Neck: supple, no lymphadenopathy, no thyromegaly, no JVD Pulm: clear to auscultation and percussion, respiratory effort normal CV: regular rate and rhythm, S1-S2, no murmur, rub or gallop, no bruits, peripheral pulses normal and symmetric, no cyanosis, clubbing, edema or varicosities GI: soft, non-tender; no hepatosplenomegaly, masses; active bowel sounds all quadrants GU: no hernia, testicular mass, penile discharge Lymph: no cervical, axillary or inguinal adenopathy MSK: gait normal, muscle tone and strength WNL, no joint swelling, effusions, discoloration, crepitus  SKIN: clear, good turgor, color WNL, no rashes, lesions, or ulcerations Neuro: normal mental status, normal strength, sensation, and motion Psych: alert; oriented to person, place and time, normally interactive and not anxious or depressed in appearance.  All labs reviewed with patient.  Lipids:    Component Value Date/Time   CHOL 230* 04/10/2012 1250   TRIG 113.0 04/10/2012 1250   HDL 68.00 04/10/2012 1250   LDLDIRECT 130.1 04/10/2012 1250   VLDL 22.6 04/10/2012 1250   CHOLHDL 3 04/10/2012 1250   CBC: CBC Latest Ref Rng 04/10/2012 02/24/2007  WBC 4.5 - 10.5 K/uL 6.7 6.1  Hemoglobin 13.0 - 17.0 g/dL 14.1 15.1  Hematocrit 39.0 - 52.0 % 41.9 43.5  Platelets 150.0 - 400.0 K/uL 229.0 976    Basic Metabolic Panel:    Component Value Date/Time   NA 136 04/10/2012 1250   K 4.2 04/10/2012 1250     CL 102 04/10/2012 1250   CO2 28 04/10/2012 1250   BUN 16 04/10/2012 1250   CREATININE 0.9 04/10/2012 1250   GLUCOSE 93 04/10/2012 1250   CALCIUM 9.8 04/10/2012 1250   Hepatic Function Latest Ref Rng 04/10/2012 07/19/2009 02/24/2007  Total Protein 6.0 - 8.3 g/dL 6.9 - 7.2  Albumin 3.5 - 5.2 g/dL 4.4 4.8 4.5  AST 0 - 37 U/L 37 - 39(H)  ALT 0 - 53 U/L 35 - 30  Alk Phosphatase 39 - 117 U/L 47 - 68  Total Bilirubin 0.3 - 1.2 mg/dL 1.2 - 1.4(H)  Bilirubin, Direct 0.0 - 0.3 mg/dL 0.1 - 0.1    Lab Results  Component Value Date   TSH 2.11 02/24/2007   Lab Results  Component Value Date   PSA 0.68 04/10/2012   PSA 0.83 07/19/2009   PSA 0.70 12/24/2007    Assessment & Plan:   Routine general medical examination at a health care facility  Mitral regurgitation, myxomatous - Plan: 2D Echocardiogram without  contrast  Health Maintenance Exam: The patient's preventative maintenance and recommended screening tests for an annual wellness exam were reviewed in full today. Brought up to date unless services declined.  Counselled on the importance of diet, exercise, and its role in overall health and mortality. The patient's FH and SH was reviewed, including their home life, tobacco status, and drug and alcohol status.  Needs f/u echo - he is going to call Missy and Dr. Rockey Situ  Follow-up: No Follow-up on file. Unless noted, follow-up in 1 year for Health Maintenance Exam.  New Prescriptions   No medications on file   Modified Medications   No medications on file   Orders Placed This Encounter  Procedures  . 2D Echocardiogram without contrast    Signed,  Dona Walby T. Emerly Prak, MD, CAQ Sports Medicine   Discontinued Medications   IBUPROFEN (ADVIL,MOTRIN) 800 MG TABLET    Take 800 mg by mouth at bedtime.     MONTELUKAST (SINGULAIR) 10 MG TABLET    Take 1 tablet (10 mg total) by mouth daily.   Current Medications at Discharge:   Medication List       This list is accurate as of: 10/08/13 11:59  PM.  Always use your most recent med list.               fexofenadine 180 MG tablet  Commonly known as:  ALLEGRA  Take 180 mg by mouth daily.     ipratropium 0.03 % nasal spray  Commonly known as:  ATROVENT  Place 2 sprays into the nose every 12 (twelve) hours.     loratadine 10 MG tablet  Commonly known as:  CLARITIN  Take 10 mg by mouth daily.     montelukast 10 MG tablet  Commonly known as:  SINGULAIR  TAKE ONE (1) TABLET BY MOUTH EVERY DAY     naproxen sodium 220 MG tablet  Commonly known as:  ANAPROX  Take 220 mg by mouth at bedtime.     PROAIR HFA 108 (90 BASE) MCG/ACT inhaler  Generic drug:  albuterol  Inhale 1-2 puffs into the lungs every 6 (six) hours as needed for wheezing or shortness of breath.     triamcinolone cream 0.1 %  Commonly known as:  KENALOG  Apply 1 application topically 2 (two) times daily.     ZYRTEC ALLERGY 10 MG tablet  Generic drug:  cetirizine  Take 10 mg by mouth daily.

## 2013-10-09 ENCOUNTER — Encounter: Payer: Self-pay | Admitting: Family Medicine

## 2013-11-12 ENCOUNTER — Other Ambulatory Visit (INDEPENDENT_AMBULATORY_CARE_PROVIDER_SITE_OTHER): Payer: BC Managed Care – PPO

## 2013-11-12 ENCOUNTER — Other Ambulatory Visit: Payer: Self-pay

## 2013-11-12 ENCOUNTER — Other Ambulatory Visit: Payer: BC Managed Care – PPO

## 2013-11-12 DIAGNOSIS — I340 Nonrheumatic mitral (valve) insufficiency: Secondary | ICD-10-CM

## 2013-11-12 DIAGNOSIS — I059 Rheumatic mitral valve disease, unspecified: Secondary | ICD-10-CM

## 2014-03-15 ENCOUNTER — Encounter: Payer: Self-pay | Admitting: Family Medicine

## 2014-03-16 MED ORDER — MONTELUKAST SODIUM 10 MG PO TABS: ORAL_TABLET | ORAL | Status: DC

## 2014-07-09 LAB — BASIC METABOLIC PANEL
BUN: 19 mg/dL (ref 4–21)
CREATININE: 0.9 mg/dL (ref 0.6–1.3)
GLUCOSE: 94 mg/dL
POTASSIUM: 4.4 mmol/L (ref 3.4–5.3)
SODIUM: 138 mmol/L (ref 137–147)

## 2014-07-09 LAB — PSA: PSA: 0.86

## 2014-07-09 LAB — LIPID PANEL
Cholesterol: 195 mg/dL (ref 0–200)
HDL: 67 mg/dL (ref 35–70)
LDL Cholesterol: 89 mg/dL
Triglycerides: 197 mg/dL — AB (ref 40–160)

## 2014-07-09 LAB — HEPATIC FUNCTION PANEL
ALT: 27 U/L (ref 10–40)
AST: 31 U/L (ref 14–40)
Alkaline Phosphatase: 53 U/L (ref 25–125)
BILIRUBIN, TOTAL: 0.4 mg/dL

## 2014-07-09 LAB — CBC AND DIFFERENTIAL
Hemoglobin: 14.3 g/dL (ref 13.5–17.5)
WBC: 6.4 10^3/mL

## 2014-08-17 ENCOUNTER — Ambulatory Visit (INDEPENDENT_AMBULATORY_CARE_PROVIDER_SITE_OTHER): Payer: BC Managed Care – PPO | Admitting: *Deleted

## 2014-08-17 DIAGNOSIS — Z23 Encounter for immunization: Secondary | ICD-10-CM | POA: Diagnosis not present

## 2014-09-20 ENCOUNTER — Encounter: Payer: Self-pay | Admitting: Internal Medicine

## 2014-09-24 ENCOUNTER — Telehealth: Payer: Self-pay | Admitting: Family Medicine

## 2014-09-24 MED ORDER — AMOXICILLIN-POT CLAVULANATE 875-125 MG PO TABS: 1.0000 | ORAL_TABLET | Freq: Two times a day (BID) | ORAL | Status: DC

## 2014-09-24 NOTE — Telephone Encounter (Signed)
Pt seen in office with new onset right ear pain in last 24 hours after several days of URI symptoms.  Hx of recurrent ear infections.  Left ear: TM clear, nml ext ear  Right ear: erythematous, no light reflex, scarring  Right OM: Sent in rx for augmentin x 10 days.

## 2015-01-06 ENCOUNTER — Encounter: Payer: Self-pay | Admitting: Internal Medicine

## 2015-01-06 ENCOUNTER — Ambulatory Visit (INDEPENDENT_AMBULATORY_CARE_PROVIDER_SITE_OTHER): Payer: BC Managed Care – PPO | Admitting: Internal Medicine

## 2015-01-06 VITALS — BP 128/78 | HR 60 | Temp 98.1°F | Resp 12 | Ht 73.25 in | Wt 203.0 lb

## 2015-01-06 DIAGNOSIS — Z0000 Encounter for general adult medical examination without abnormal findings: Secondary | ICD-10-CM | POA: Diagnosis not present

## 2015-01-06 MED ORDER — MONTELUKAST SODIUM 10 MG PO TABS: ORAL_TABLET | ORAL | Status: DC

## 2015-01-06 NOTE — Progress Notes (Signed)
Patient ID: [name], male    DOB: Apr 22, 1955  Age: 59 y.o. MRN: [id number]  The patient is here for annual wellness examination    The risk factors are reflected in the social history and were reviewed and updated as follows:  Social History   Social History  . Marital Status: Married    Spouse Name: [name]  . Number of Children: 2  . 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 Smokeless tobacco: Never Used  . Alcohol Use: 12.6 oz/week    21 Glasses of wine per week  . Drug Use: No  . Sexual Activity:    Partners: Female   Other Topics Concern  . Not on file   Social History Narrative   Physician.   Married to [name]   2 Children   Daughter: [name]   1 son, [name], married with child    The roster of all physicians providing medical care to patient - is listed in the Snapshot section of the chart.  Home safety : The patient has smoke detectors in the home. They wear seatbelts.  There are no firearms at home. There is no violence in the home.   There is no risks for hepatitis, STDs or HIV. There is no   history of blood transfusion. They have no travel history to infectious disease endemic areas of the world.  The patient has seen their dentist in the last six month. They have seen their eye doctor in the last year. They admit to slight hearing difficulty with regard to whispered voices and some television programs.  They have deferred audiologic testing in the last year.  They do not  have excessive sun exposure. Discussed the need for sun protection: hats, long sleeves and use of sunscreen if there is significant sun exposure.   Diet: the importance of a healthy diet is discussed. They do have a healthy diet.  The benefits of regular aerobic exercise were discussed. He exercises 5  Days er week. 30 to 60 minutes.   Depression screen: there are  no signs or vegative symptoms of depression- irritability, change in appetite, anhedonia, sadness/tearfullness.  The following portions of the patient's history were reviewed and updated as appropriate: allergies, current medications, past family history, past medical history,  past surgical history, past social history  and problem list.  Visual acuity was not assessed per patient preference since she has regular follow up with her ophthalmologist. Hearing and body mass index were assessed and reviewed.   During the course of the visit the patient was educated and counseled about appropriate screening and preventive services including : fall prevention , diabetes screening, nutrition counseling, colorectal cancer screening, and recommended immunizations.    CC: The encounter diagnosis was Visit for preventive health examination.  History [name] has a past medical history of ALLERGIC RHINITIS (11/25/2008); Mitral regurgitation, myxomatous (04/11/2012); Mitral valve prolapse (04/11/2012); and Hearing loss, right (04/11/2012).   He has past surgical history that includes Tonsillectomy; Ossicular reconstruction; Echo (other); Bilateral laparoscopic hernia repairs (1992); Echo (other) (06/21/2000); Echo (other) (07/26/2006); Colonoscopy (11/17/07); Nasal Septoplasty (other) (2008); and Echo (other) (06/15/2009).   His family history includes Breast cancer in his mother; Breast cancer (age of onset: 36) in his sister; Congestive Heart Failure in his father; Diabetes in his father; Hypertension in his mother; Hyperthyroidism in  his sister; Peripheral Artery Disease (age of onset: 57) in his father; Prostate cancer in his brother.He reports that he has never smoked. He has never used smokeless tobacco. He reports that he drinks about 12.6 oz of alcohol per week. He reports that he does not use illicit drugs.  Outpatient Prescriptions Prior to Visit  Medication Sig Dispense Refill  . cetirizine (ZYRTEC ALLERGY)  10 MG tablet Take 10 mg by mouth daily.      . fexofenadine (ALLEGRA) 180 MG tablet Take 180 mg by mouth daily.      Marland Kitchen loratadine (CLARITIN) 10 MG tablet Take 10 mg by mouth daily.      . naproxen sodium (ANAPROX) 220 MG tablet Take 440 mg by mouth at bedtime.     . triamcinolone cream (KENALOG) 0.1 % Apply 1 application topically 2 (two) times daily. 45 g 1  . montelukast (SINGULAIR) 10 MG tablet TAKE ONE (1) TABLET BY MOUTH EVERY DAY 90 tablet 3  . albuterol (PROAIR HFA) 108 (90 BASE) MCG/ACT inhaler Inhale 1-2 puffs into the lungs every 6 (six) hours as needed for wheezing or shortness of breath.    Marland Kitchen amoxicillin-clavulanate (AUGMENTIN) 875-125 MG per tablet Take 1 tablet by mouth 2 (two) times daily. 20 tablet 1  . ipratropium (ATROVENT) 0.03 % nasal spray Place 2 sprays into the nose every 12 (twelve) hours. 30 mL 11   No facility-administered medications prior to visit.    Review of Systems   Patient denies headache, fevers, malaise, unintentional weight loss, skin rash, eye pain, sinus congestion and sinus pain, sore throat, dysphagia,  hemoptysis , cough, dyspnea, wheezing, chest pain, palpitations, orthopnea, edema, abdominal pain, nausea, melena, diarrhea, constipation, flank pain, dysuria, hematuria, urinary  Frequency, nocturia, numbness, tingling, seizures,  Focal weakness, Loss of consciousness,  Tremor, insomnia, depression, anxiety, and suicidal ideation.      Objective:  BP 128/78 mmHg  Pulse 60  Temp(Src) 98.1 F (36.7 C) (Oral)  Resp 12  Ht 6' 1.25" (1.861 m)  Wt 203 lb (92.08 kg)  BMI 26.59 kg/m2  SpO2 97%  Physical Exam   General appearance: alert, cooperative and appears stated age Ears: normal TM's and external ear canals both ears Throat: lips, mucosa, and tongue normal; teeth and gums normal Neck: no adenopathy, no carotid bruit, supple, symmetrical, trachea midline and thyroid not enlarged, symmetric, no tenderness/mass/nodules Back: symmetric, no  curvature. ROM normal. No CVA tenderness. Lungs: clear to auscultation bilaterally Heart: regular rate and rhythm, S1, S2 normal, no murmur, click, rub or gallop Abdomen: soft, non-tender; bowel sounds normal; no masses,  no organomegaly Pulses: 2+ and symmetric Skin: Skin color, texture, turgor normal. No rashes or lesions Lymph nodes: Cervical, supraclavicular, and axillary nodes normal. Neuro: CNs 2-12 intact. DTRs 2+/4 in biceps, brachioradialis, patellars and achilles. Muscle strength 5/5 in upper and lower exremities. Fine resting tremor bilaterally both hands cerebellar function normal. Romberg negative.  No pronator drift.   Gait normal.     Assessment & Plan:   Problem List Items Addressed This Visit    Visit for preventive health examination - Primary    Annual comprehensive preventive exam was done as well as an evaluation and management of chronic conditions .  During the course of the visit the patient was educated and counseled about appropriate screening and preventive services including :  diabetes screening, lipid analysis with projected  10 year  risk for CAD  Which is 9.7 % using the Framingham risk calculator for  men, , nutrition counseling, colorectal cancer screening, and recommended immunizations.  Printed recommendations for health maintenance screenings was given.          I have discontinued Mr. Abernathy ipratropium, albuterol, and amoxicillin-clavulanate. I am also having him maintain his fexofenadine, loratadine, cetirizine, naproxen sodium, triamcinolone cream, and montelukast.  Meds ordered this encounter  Medications  . montelukast (SINGULAIR) 10 MG tablet    Sig: TAKE ONE (1) TABLET BY MOUTH EVERY DAY    Dispense:  90 tablet    Refill:  3    Medications Discontinued During This Encounter  Medication Reason  . amoxicillin-clavulanate (AUGMENTIN) 875-125 MG per tablet Completed Course  . albuterol (PROAIR HFA) 108 (90 BASE) MCG/ACT inhaler Completed  Course  . ipratropium (ATROVENT) 0.03 % nasal spray Error  . montelukast (SINGULAIR) 10 MG tablet Reorder    Follow-up: No Follow-up on file.   Crecencio Mc, MD

## 2015-01-06 NOTE — Patient Instructions (Signed)

## 2015-01-06 NOTE — Progress Notes (Signed)
Pre-visit discussion using our clinic review tool. No additional management support is needed unless otherwise documented below in the visit note.  

## 2015-01-08 DIAGNOSIS — Z0000 Encounter for general adult medical examination without abnormal findings: Secondary | ICD-10-CM | POA: Insufficient documentation

## 2015-01-08 NOTE — Assessment & Plan Note (Signed)
Annual comprehensive preventive exam was done as well as an evaluation and management of chronic conditions .  During the course of the visit the patient was educated and counseled about appropriate screening and preventive services including :  diabetes screening, lipid analysis with projected  10 year  risk for CAD  Which is 9.7 % using the Framingham risk calculator for men, , nutrition counseling, colorectal cancer screening, and recommended immunizations.  Printed recommendations for health maintenance screenings was given.

## 2015-01-12 ENCOUNTER — Encounter: Payer: Self-pay | Admitting: Internal Medicine

## 2015-01-14 ENCOUNTER — Encounter: Payer: Self-pay | Admitting: Family Medicine

## 2016-01-10 ENCOUNTER — Ambulatory Visit (INDEPENDENT_AMBULATORY_CARE_PROVIDER_SITE_OTHER): Payer: BC Managed Care – PPO | Admitting: Internal Medicine

## 2016-01-10 ENCOUNTER — Encounter: Payer: Self-pay | Admitting: Internal Medicine

## 2016-01-10 DIAGNOSIS — I341 Nonrheumatic mitral (valve) prolapse: Secondary | ICD-10-CM

## 2016-01-10 DIAGNOSIS — H9011 Conductive hearing loss, unilateral, right ear, with unrestricted hearing on the contralateral side: Secondary | ICD-10-CM | POA: Diagnosis not present

## 2016-01-10 DIAGNOSIS — Z0000 Encounter for general adult medical examination without abnormal findings: Secondary | ICD-10-CM

## 2016-01-10 DIAGNOSIS — E785 Hyperlipidemia, unspecified: Secondary | ICD-10-CM | POA: Insufficient documentation

## 2016-01-10 HISTORY — DX: Hyperlipidemia, unspecified: E78.5

## 2016-01-10 MED ORDER — ALBUTEROL SULFATE HFA 108 (90 BASE) MCG/ACT IN AERS: 1.0000 | INHALATION_SPRAY | Freq: Four times a day (QID) | RESPIRATORY_TRACT | 12 refills | Status: DC | PRN

## 2016-01-10 MED ORDER — TRIAMCINOLONE ACETONIDE 0.1 % EX CREA: 1.0000 "application " | TOPICAL_CREAM | Freq: Two times a day (BID) | CUTANEOUS | 1 refills | Status: DC

## 2016-01-10 MED ORDER — MONTELUKAST SODIUM 10 MG PO TABS: ORAL_TABLET | ORAL | 3 refills | Status: DC

## 2016-01-10 NOTE — Progress Notes (Signed)
Pre-visit discussion using our clinic review tool. No additional management support is needed unless otherwise documented below in the visit note.  

## 2016-01-10 NOTE — Progress Notes (Signed)
Patient ID: [name], male    DOB: 25-May-1955  Age: 60 y.o. MRN: [id number]  The patient is here for annual wellness examination and management of other chronic and acute problems.   The risk factors are reflected in the social history. colonoscopy aug 2009 normal    The roster of all physicians providing medical care to patient - is listed in the Snapshot section of the chart.  Home safety : The patient has smoke detectors in the home. They wear seatbelts.  There are no firearms at home. There is no violence in the home.   There is no risks for hepatitis, STDs or HIV. There is no   history of blood transfusion. They have no travel history to infectious disease endemic areas of the world.  The patient has seen their dentist in the last six month. They have seen their eye doctor in the last year. He is planning to request a hearing aid for conductive hearing loss from  [facility] in [place]  Who has done prior surgeriies on his right ear including  A  stapedectomy followed by prosthesis which provided transient improvement.    They do not  have excessive sun exposure. Discussed the need for sun protection: hats, long sleeves and use of sunscreen if there is significant sun exposure.   Diet: the importance of a healthy diet is discussed. They do have a healthy diet.  The benefits of regular aerobic exercise were discussed. Bikes daily 23 miles  5 days per week s.   Depression screen: there are no signs or vegative symptoms of depression- irritability, change in appetite, anhedonia, sadness/tearfullness.  The following portions of the patient's history were reviewed and updated as appropriate: allergies, current medications, past family history, past medical history,  past surgical history, past social history  and problem list.  Visual acuity was not assessed per patient preference since she has regular follow up with her ophthalmologist. Hearing and body mass index were assessed and  reviewed.   During the course of the visit the patient was educated and counseled about appropriate screening and preventive services including : fall prevention , diabetes screening, nutrition counseling, colorectal cancer screening, and recommended immunizations.    CC: Diagnoses of Hyperlipidemia LDL goal <130, Visit for preventive health examination, Mitral valve prolapse, and Conductive hearing loss of right ear with unrestricted hearing of left ear were pertinent to this visit.  History Derrin has a past medical history of ALLERGIC RHINITIS (11/25/2008); Hearing loss, right (04/11/2012); Mitral regurgitation, myxomatous (04/11/2012); and Mitral valve prolapse (04/11/2012).   He has a past surgical history that includes Tonsillectomy; Ossicular reconstruction; Echo (other); Bilateral laparoscopic hernia repairs (1992); Echo (other) (06/21/2000); Echo (other) (07/26/2006); Colonoscopy (11/17/07); Nasal Septoplasty (other) (2008); and Echo (other) (06/15/2009).   His family history includes Breast cancer in his mother; Breast cancer (age of onset: 37) in his sister; Congestive Heart Failure in his father; Diabetes in his father; Hypertension in his mother; Hyperthyroidism in his sister; Peripheral Artery Disease (age of onset: 75) in his father; Prostate cancer in his brother.He reports that he has never smoked. He has never used smokeless tobacco. He reports that he drinks about 12.6 oz of alcohol per week . He reports that he does not use drugs.  Outpatient Medications Prior to Visit  Medication Sig Dispense Refill  . cetirizine (ZYRTEC ALLERGY) 10 MG tablet Take 10 mg by mouth daily.      . fexofenadine (ALLEGRA) 180 MG tablet Take 180 mg  by mouth daily.      Marland Kitchen loratadine (CLARITIN) 10 MG tablet Take 10 mg by mouth daily.      . naproxen sodium (ANAPROX) 220 MG tablet Take 440 mg by mouth at bedtime.     . montelukast (SINGULAIR) 10 MG tablet TAKE ONE (1) TABLET BY MOUTH EVERY DAY 90 tablet 3  .  triamcinolone cream (KENALOG) 0.1 % Apply 1 application topically 2 (two) times daily. 45 g 1   No facility-administered medications prior to visit.     Review of Systems   Patient denies headache, fevers, malaise, unintentional weight loss, skin rash, eye pain, sinus congestion and sinus pain, sore throat, dysphagia,  hemoptysis , cough, dyspnea, wheezing, chest pain, palpitations, orthopnea, edema, abdominal pain, nausea, melena, diarrhea, constipation, flank pain, dysuria, hematuria, urinary  Frequency, nocturia, numbness, tingling, seizures,  Focal weakness, Loss of consciousness,  Tremor, insomnia, depression, anxiety, and suicidal ideation.      Objective:  BP 128/80   Pulse 77   Temp 98 F (36.7 C) (Oral)   Resp 12   Ht 6\' 1"  (1.854 m)   Wt 201 lb 8 oz (91.4 kg)   SpO2 95%   BMI 26.58 kg/m   Physical Exam  General appearance: alert, cooperative and appears stated age Ears: normal TM's and external ear canals both ears Throat: lips, mucosa, and tongue normal; teeth and gums normal Neck: no adenopathy, no carotid bruit, supple, symmetrical, trachea midline and thyroid not enlarged, symmetric, no tenderness/mass/nodules Back: symmetric, no curvature. ROM normal. No CVA tenderness. Lungs: clear to auscultation bilaterally Heart: regular rate and rhythm, S1, S2 normal, no murmur, click, rub or gallop Abdomen: soft, non-tender; bowel sounds normal; no masses,  no organomegaly Pulses: 2+ and symmetric Skin: Skin color, texture, turgor normal. No rashes or lesions Lymph nodes: Cervical, supraclavicular, and axillary nodes normal.   Assessment & Plan:   Problem List Items Addressed This Visit    Mitral valve prolapse    He is asymptomatic,  And declines use of ace inhibitor for A/L reduction       Hearing loss, right    Conductive, with prior multiple failed corrective surgeries.  He has decided to request a hearing aide.      Visit for preventive health examination     .Annual comprehensive preventive exam was done as well as an evaluation and management of acute and chronic conditions .  During the course of the visit the patient was educated and counseled about appropriate screening and preventive services including :  diabetes screening, lipid analysis with projected  10 year  risk for CAD , nutrition counseling, prostate and colorectal cancer screening, and recommended immunizations.  Printed recommendations for health maintenance screenings was given.  He has deferred lipid analysis since his lipids were normal last year.       Hyperlipidemia LDL goal <130    Other Visit Diagnoses   None.     I am having Mr. Batten maintain his fexofenadine, loratadine, cetirizine, naproxen sodium, triamcinolone cream, montelukast, and albuterol.  Meds ordered this encounter  Medications  . triamcinolone cream (KENALOG) 0.1 %    Sig: Apply 1 application topically 2 (two) times daily.    Dispense:  45 g    Refill:  1  . montelukast (SINGULAIR) 10 MG tablet    Sig: TAKE ONE (1) TABLET BY MOUTH EVERY DAY    Dispense:  90 tablet    Refill:  3  . albuterol (PROAIR HFA) 108 (90  Base) MCG/ACT inhaler    Sig: Inhale 1-2 puffs into the lungs every 6 (six) hours as needed for wheezing or shortness of breath.    Dispense:  1 Inhaler    Refill:  12    Medications Discontinued During This Encounter  Medication Reason  . triamcinolone cream (KENALOG) 0.1 % Reorder  . montelukast (SINGULAIR) 10 MG tablet Reorder    Follow-up: Return in about 1 year (around 01/09/2017), or annual CPE .   Crecencio Mc, MD

## 2016-01-11 NOTE — Assessment & Plan Note (Signed)
.  Annual comprehensive preventive exam was done as well as an evaluation and management of acute and chronic conditions .  During the course of the visit the patient was educated and counseled about appropriate screening and preventive services including :  diabetes screening, lipid analysis with projected  10 year  risk for CAD , nutrition counseling, prostate and colorectal cancer screening, and recommended immunizations.  Printed recommendations for health maintenance screenings was given.  He has deferred lipid analysis since his lipids were normal last year.

## 2016-01-11 NOTE — Assessment & Plan Note (Signed)
Conductive, with prior multiple failed corrective surgeries.  He has decided to request a hearing aide.

## 2016-01-11 NOTE — Assessment & Plan Note (Addendum)
He is asymptomatic,  And declines use of ace inhibitor for A/L reduction

## 2016-07-03 LAB — CBC AND DIFFERENTIAL
HCT: 46 % (ref 41–53)
Hemoglobin: 15.4 g/dL (ref 13.5–17.5)
Neutrophils Absolute: 3990 /uL
Platelets: 254 10*3/uL (ref 150–399)
WBC: 7 10^3/mL

## 2016-07-03 LAB — HEPATIC FUNCTION PANEL
ALK PHOS: 51 U/L (ref 25–125)
ALT: 29 U/L (ref 10–40)
AST: 30 U/L (ref 14–40)
Bilirubin, Total: 0.8 mg/dL

## 2016-07-03 LAB — BASIC METABOLIC PANEL
BUN: 16 mg/dL (ref 4–21)
CREATININE: 0.9 mg/dL (ref 0.6–1.3)
GLUCOSE: 87 mg/dL
POTASSIUM: 4.5 mmol/L (ref 3.4–5.3)
Sodium: 138 mmol/L (ref 137–147)

## 2016-07-03 LAB — LIPID PANEL
Cholesterol: 215 mg/dL — AB (ref 0–200)
HDL: 77 mg/dL — AB (ref 35–70)
LDL Cholesterol: 108 mg/dL
Triglycerides: 149 mg/dL (ref 40–160)

## 2016-07-03 LAB — PSA: PSA: 0.6

## 2016-07-09 ENCOUNTER — Other Ambulatory Visit: Payer: Self-pay

## 2017-01-10 ENCOUNTER — Ambulatory Visit (INDEPENDENT_AMBULATORY_CARE_PROVIDER_SITE_OTHER): Payer: BC Managed Care – PPO | Admitting: Internal Medicine

## 2017-01-10 ENCOUNTER — Encounter: Payer: Self-pay | Admitting: Internal Medicine

## 2017-01-10 VITALS — BP 136/78 | HR 60 | Temp 98.4°F | Resp 15 | Ht 73.0 in | Wt 202.8 lb

## 2017-01-10 DIAGNOSIS — Z0000 Encounter for general adult medical examination without abnormal findings: Secondary | ICD-10-CM | POA: Diagnosis not present

## 2017-01-10 DIAGNOSIS — D225 Melanocytic nevi of trunk: Secondary | ICD-10-CM | POA: Diagnosis not present

## 2017-01-10 DIAGNOSIS — I341 Nonrheumatic mitral (valve) prolapse: Secondary | ICD-10-CM

## 2017-01-10 MED ORDER — MONTELUKAST SODIUM 10 MG PO TABS: ORAL_TABLET | ORAL | 3 refills | Status: DC

## 2017-01-10 NOTE — Progress Notes (Signed)
Patient ID: [name], male    DOB: [birth year]  Age: 61 y.o. MRN: [id number]  The patient is here for annual preventive examination and management of other chronic and acute problems.  PSA NORMAL April 2018 Lipids NORMAL AS WELL April 2018    The risk factors are reflected in the social history.  The roster of all physicians providing medical care to patient - is listed in the Snapshot section of the chart.  Activities of daily living:  The patient is 100% independent in all ADLs: dressing, toileting, feeding as well as independent mobility  Home safety : The patient has smoke detectors in the home. They wear seatbelts.  There are no firearms at home. There is no violence in the home3-4 times per week,  .   There is no risks for hepatitis, STDs or HIV. There is no   history of blood transfusion. They have no travel history to infectious disease endemic areas of the world.  The patient has seen their dentist in the last six month. They have seen their eye doctor in the last year. They admit to slight hearing difficulty with regard to whispered voices and some television programs.  They have deferred audiologic testing in the last year.  They do not  have excessive sun exposure. Discussed the need for sun protection: hats, long sleeves and use of sunscreen if there is significant sun exposure.   Diet: the importance of a healthy diet is discussed. They do have a healthy diet.  The benefits of regular aerobic exercise were discussed. exercises regularly   No joint issues ,  Some muscle aches aleve with naproxen ,   Depression screen: there are no signs or vegative symptoms of depression- irritability, change in appetite, anhedonia, sadness/tearfullness.   The following portions of the patient's history were reviewed and updated as appropriate: allergies, current medications, past family history, past medical history,  past surgical history, past social history  and problem  list.  Visual acuity was not assessed per patient preference since she has regular follow up with her ophthalmologist. Hearing and body mass index were assessed and reviewed.   During the course of the visit the patient was educated and counseled about appropriate screening and preventive services including : fall prevention , diabetes screening, nutrition counseling, colorectal cancer screening, and recommended immunizations.    CC: The primary encounter diagnosis was MVP (mitral valve prolapse). Diagnoses of Mitral valve prolapse, Visit for preventive health examination, and Atypical nevus of scapular region were also pertinent to this visit.  History [name] has a past medical history of ALLERGIC RHINITIS (11/25/2008); Hearing loss, right (04/11/2012); Mitral regurgitation, myxomatous (04/11/2012); and Mitral valve prolapse (04/11/2012).   He has a past surgical history that includes Tonsillectomy; Ossicular reconstruction; Echo (other); Bilateral laparoscopic hernia repairs (1992); Echo (other) (06/21/2000); Echo (other) (07/26/2006); Colonoscopy (11/17/07); Nasal Septoplasty (other) (2008); and Echo (other) (06/15/2009).   His family history includes Breast cancer in his mother; Breast cancer (age of onset: 35) in his sister; Congestive Heart Failure in his father; Diabetes in his father; Hypertension in his mother; Hyperthyroidism in his sister; Peripheral Artery Disease (age of onset: 19) in his father; Prostate cancer in his brother.He reports that he has never smoked. He has never used smokeless tobacco. He reports that he drinks about 12.6 oz of alcohol per week . He reports that he does not use drugs.  Outpatient Medications Prior to Visit  Medication Sig Dispense Refill  . albuterol (PROAIR HFA) 108 (  90 Base) MCG/ACT inhaler Inhale 1-2 puffs into the lungs every 6 (six) hours as needed for wheezing or shortness of breath. 1 Inhaler 12  . cetirizine (ZYRTEC ALLERGY) 10 MG tablet Take 10 mg by  mouth daily.      . fexofenadine (ALLEGRA) 180 MG tablet Take 180 mg by mouth daily.      Marland Kitchen loratadine (CLARITIN) 10 MG tablet Take 10 mg by mouth daily.      . naproxen sodium (ANAPROX) 220 MG tablet Take 440 mg by mouth as needed.     . triamcinolone cream (KENALOG) 0.1 % Apply 1 application topically 2 (two) times daily. (Patient taking differently: Apply 1 application topically as needed. ) 45 g 1  . montelukast (SINGULAIR) 10 MG tablet TAKE ONE (1) TABLET BY MOUTH EVERY DAY 90 tablet 3   No facility-administered medications prior to visit.     Review of Systems   Patient denies headache, fevers, malaise, unintentional weight loss, skin rash, eye pain, sinus congestion and sinus pain, sore throat, dysphagia,  hemoptysis , cough, dyspnea, wheezing, chest pain, palpitations, orthopnea, edema, abdominal pain, nausea, melena, diarrhea, constipation, flank pain, dysuria, hematuria, urinary  Frequency, nocturia, numbness, tingling, seizures,  Focal weakness, Loss of consciousness,  Tremor, insomnia, depression, anxiety, and suicidal ideation.      Objective:  BP 136/78 (BP Location: Left Arm, Patient Position: Sitting, Cuff Size: Normal)   Pulse 60   Temp 98.4 F (36.9 C) (Oral)   Resp 15   Ht 6\' 1"  (1.854 m)   Wt 202 lb 12.8 oz (92 kg)   SpO2 97%   BMI 26.76 kg/m   Physical Exam   General appearance: alert, cooperative and appears stated age Ears: normal TM's and external ear canals both ears Throat: lips, mucosa, and tongue normal; teeth and gums normal Neck: no adenopathy, no carotid bruit, supple, symmetrical, trachea midline and thyroid not enlarged, symmetric, no tenderness/mass/nodules Back: symmetric, no curvature. ROM normal. No CVA tenderness. Lungs: clear to auscultation bilaterally Heart: regular rate and rhythm, S1, S2 normal, no murmur, click, rub or gallop Abdomen: soft, non-tender; bowel sounds normal; no masses,  no organomegaly Pulses: 2+ and symmetric Skin:   nevus with irregular stellate outline left thoracic area.  Lymph nodes: Cervical, supraclavicular, and axillary nodes normal.    Assessment & Plan:   Problem List Items Addressed This Visit    Atypical nevus of scapular region    Left side.  Dermatology referral recommended  He does not need a referral.       Mitral valve prolapse    He is asymptomatic,  And declines use of ace inhibitor for A/L reduction .  Last ECHO was 2015      Visit for preventive health examination    Annual comprehensive preventive exam was done as well as an evaluation and management of acute and chronic conditions .  During the course of the visit the patient was educated and counseled about appropriate screening and preventive services including :  diabetes screening, lipid analysis with projected  10 year  risk for CAD , nutrition counseling, prostate and colorectal cancer screening, and recommended immunizations.  Printed recommendations for health maintenance screenings was given.   Lab Results  Component Value Date   PSA 0.6 07/03/2016   PSA 0.86 07/09/2014   PSA 0.68 04/10/2012            Other Visit Diagnoses    MVP (mitral valve prolapse)    -  Primary  Relevant Orders   ECHOCARDIOGRAM COMPLETE      I am having [name] maintain his fexofenadine, loratadine, cetirizine, naproxen sodium, triamcinolone cream, albuterol, and montelukast.  Meds ordered this encounter  Medications  . DISCONTD: montelukast (SINGULAIR) 10 MG tablet    Sig: TAKE ONE (1) TABLET BY MOUTH EVERY DAY    Dispense:  90 tablet    Refill:  3  . montelukast (SINGULAIR) 10 MG tablet    Sig: TAKE ONE (1) TABLET BY MOUTH EVERY DAY    Dispense:  90 tablet    Refill:  3    Medications Discontinued During This Encounter  Medication Reason  . montelukast (SINGULAIR) 10 MG tablet Reorder  . montelukast (SINGULAIR) 10 MG tablet Reorder    Follow-up: No Follow-up on file.   Crecencio Mc, MD

## 2017-01-10 NOTE — Patient Instructions (Addendum)
Let me know if you need dermatology referral

## 2017-01-12 DIAGNOSIS — D225 Melanocytic nevi of trunk: Secondary | ICD-10-CM | POA: Insufficient documentation

## 2017-01-12 HISTORY — DX: Melanocytic nevi of trunk: D22.5

## 2017-01-12 NOTE — Assessment & Plan Note (Signed)
Left side.  Dermatology referral recommended  He does not need a referral.

## 2017-01-12 NOTE — Assessment & Plan Note (Signed)
Annual comprehensive preventive exam was done as well as an evaluation and management of acute and chronic conditions .  During the course of the visit the patient was educated and counseled about appropriate screening and preventive services including :  diabetes screening, lipid analysis with projected  10 year  risk for CAD , nutrition counseling, prostate and colorectal cancer screening, and recommended immunizations.  Printed recommendations for health maintenance screenings was given.   Lab Results  Component Value Date   PSA 0.6 07/03/2016   PSA 0.86 07/09/2014   PSA 0.68 04/10/2012

## 2017-01-12 NOTE — Assessment & Plan Note (Signed)
He is asymptomatic,  And declines use of ace inhibitor for A/L reduction .  Last ECHO was 2015

## 2017-01-17 ENCOUNTER — Other Ambulatory Visit: Payer: Self-pay

## 2017-01-17 ENCOUNTER — Ambulatory Visit (INDEPENDENT_AMBULATORY_CARE_PROVIDER_SITE_OTHER): Payer: BC Managed Care – PPO

## 2017-01-17 DIAGNOSIS — I341 Nonrheumatic mitral (valve) prolapse: Secondary | ICD-10-CM

## 2017-01-19 ENCOUNTER — Encounter: Payer: Self-pay | Admitting: Internal Medicine

## 2017-03-04 ENCOUNTER — Other Ambulatory Visit: Payer: Self-pay | Admitting: Family Medicine

## 2017-03-04 MED ORDER — AMOXICILLIN-POT CLAVULANATE 875-125 MG PO TABS: 1.0000 | ORAL_TABLET | Freq: Two times a day (BID) | ORAL | 0 refills | Status: AC

## 2017-03-04 NOTE — Progress Notes (Signed)
I examined the patient in the office - h/o R ear surgery in the past with 2 days of worsening maxillary sinus pain and R ear pain.  ROM: augmentin 875 mg po bid x 10 d

## 2017-04-22 ENCOUNTER — Other Ambulatory Visit: Payer: Self-pay | Admitting: Family Medicine

## 2017-04-22 MED ORDER — AMOXICILLIN-POT CLAVULANATE 875-125 MG PO TABS: 1.0000 | ORAL_TABLET | Freq: Two times a day (BID) | ORAL | 0 refills | Status: AC

## 2017-04-22 NOTE — Progress Notes (Signed)
Exam c/w ROM +/- sinusitis.  rx augmentin

## 2017-10-17 ENCOUNTER — Encounter: Payer: Self-pay | Admitting: Emergency Medicine

## 2017-10-17 ENCOUNTER — Emergency Department
Admission: EM | Admit: 2017-10-17 | Discharge: 2017-10-17 | Disposition: A | Discharge status: Home / Self Care | Payer: BC Managed Care – PPO | Attending: Emergency Medicine | Admitting: Emergency Medicine

## 2017-10-17 ENCOUNTER — Other Ambulatory Visit: Payer: Self-pay

## 2017-10-17 ENCOUNTER — Emergency Department: Payer: BC Managed Care – PPO

## 2017-10-17 DIAGNOSIS — S0003XA Contusion of scalp, initial encounter: Secondary | ICD-10-CM | POA: Diagnosis not present

## 2017-10-17 DIAGNOSIS — Y9389 Activity, other specified: Secondary | ICD-10-CM | POA: Diagnosis not present

## 2017-10-17 DIAGNOSIS — S0990XA Unspecified injury of head, initial encounter: Secondary | ICD-10-CM | POA: Diagnosis present

## 2017-10-17 DIAGNOSIS — Y998 Other external cause status: Secondary | ICD-10-CM | POA: Diagnosis not present

## 2017-10-17 DIAGNOSIS — R410 Disorientation, unspecified: Secondary | ICD-10-CM

## 2017-10-17 DIAGNOSIS — G4751 Confusional arousals: Secondary | ICD-10-CM | POA: Diagnosis not present

## 2017-10-17 DIAGNOSIS — Y92410 Unspecified street and highway as the place of occurrence of the external cause: Secondary | ICD-10-CM | POA: Insufficient documentation

## 2017-10-17 MED ORDER — ACETAMINOPHEN 500 MG PO TABS
1000.0000 mg | ORAL_TABLET | Freq: Once | ORAL | Status: AC
  Administered 2017-10-17: 1000 mg via ORAL
  Filled 2017-10-17: qty 2

## 2017-10-17 MED ORDER — ONDANSETRON 4 MG PO TBDP: 4.0000 mg | ORAL_TABLET | Freq: Three times a day (TID) | ORAL | 0 refills | Status: DC | PRN

## 2017-10-17 NOTE — Discharge Instructions (Addendum)
You may continue to apply ice for 15 minutes every 2 hours to the contusion on the back of your scalp to decrease swelling.  You may take Tylenol or Motrin for your pain.  Zofran is for nausea or vomiting.  Please make a follow-up with your primary care physician.  Return to the emergency department if you develop severe pain, nausea or vomiting, changes in vision, speech or mental status, numbness tingling or weakness, or any other symptoms concerning to you.

## 2017-10-17 NOTE — ED Notes (Signed)
AAOx3.  Skin warm and dry. NAD.  Ambulates with easy and steady gait. NAD °
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
## 2017-10-25 ENCOUNTER — Encounter: Payer: Self-pay | Admitting: Internal Medicine

## 2017-10-28 ENCOUNTER — Ambulatory Visit: Payer: BC Managed Care – PPO | Admitting: Family Medicine

## 2017-10-28 ENCOUNTER — Encounter: Payer: Self-pay | Admitting: Family Medicine

## 2017-10-28 DIAGNOSIS — S060X0D Concussion without loss of consciousness, subsequent encounter: Secondary | ICD-10-CM | POA: Diagnosis not present
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
## 2017-10-29 ENCOUNTER — Telehealth: Payer: Self-pay

## 2017-10-29 DIAGNOSIS — S060X9A Concussion with loss of consciousness of unspecified duration, initial encounter: Secondary | ICD-10-CM | POA: Insufficient documentation

## 2017-10-29 DIAGNOSIS — S060XAA Concussion with loss of consciousness status unknown, initial encounter: Secondary | ICD-10-CM | POA: Insufficient documentation
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
## 2017-10-29 NOTE — Telephone Encounter (Signed)
-----   Message from Irene Shipper, MD sent at 10/28/2017  9:05 AM EDT ----- Denice Paradise, I will move your colonoscopy to 8:30 AM on September 23. You will need to arrive at 7:30. I hope this helps. Barbette Merino, I will start my 8 AM double case at 7:45 AM. We will put Dr. Silvio Pate in at 8:30 for colonoscopy. Please adjust On my schedule. Also please adjust his prep instructions, if they have been already provided. Please get the okay from [place] as well. Thanks jp ----- Message ----- From: [name], MD Sent: 10/25/2017   8:57 PM To: Irene Shipper, MD  Hi John!  Hard to believe but it has been 10 years since my colonoscopy. I was quite disappointed to find out that even calling 2 months ahead for my preferred date of September 23rd was already booked out so that the first available was 2:30PM! [place]!  Can you see if you can at least get me on a cancellation list to get me scheduled earlier ( preferably in the morning). I am okay with whoever could do it if it could be moved up.  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

## 2017-10-29 NOTE — Telephone Encounter (Signed)
Appt adjusted per Dr. Henrene Pastor, Erasmo Downer aware and ok'd the switch.

## 2017-12-11 ENCOUNTER — Ambulatory Visit (AMBULATORY_SURGERY_CENTER): Payer: Self-pay | Admitting: *Deleted

## 2017-12-11 ENCOUNTER — Encounter: Payer: Self-pay | Admitting: Internal Medicine

## 2017-12-11 VITALS — Ht 73.0 in | Wt 203.0 lb

## 2017-12-11 DIAGNOSIS — Z1211 Encounter for screening for malignant neoplasm of colon: Secondary | ICD-10-CM

## 2017-12-11 MED ORDER — PEG-KCL-NACL-NASULF-NA ASC-C 140 G PO SOLR: 1.0000 | Freq: Once | ORAL | 0 refills | Status: AC

## 2017-12-11 NOTE — Progress Notes (Signed)
Patient denies any allergies to eggs or soy. Patient denies any problems with anesthesia/sedation. Patient denies any oxygen use at home. Patient denies taking any diet/weight loss medications or blood thinners. EMMI education offered, pt declined. Patient had a hard time drinking the Moviprep last colon. Plenvu sample given to pt.

## 2017-12-23 ENCOUNTER — Ambulatory Visit (AMBULATORY_SURGERY_CENTER): Payer: BC Managed Care – PPO | Admitting: Internal Medicine

## 2017-12-23 ENCOUNTER — Encounter: Payer: Self-pay | Admitting: Internal Medicine

## 2017-12-23 VITALS — BP 142/70 | HR 53 | Temp 98.4°F | Resp 18 | Ht 73.0 in | Wt 203.0 lb

## 2017-12-23 DIAGNOSIS — Z1211 Encounter for screening for malignant neoplasm of colon: Secondary | ICD-10-CM

## 2017-12-23 MED ORDER — SODIUM CHLORIDE 0.9 % IV SOLN
500.0000 mL | Freq: Once | INTRAVENOUS | Status: DC
Start: 2017-12-23 — End: 2017-12-23

## 2017-12-23 NOTE — Progress Notes (Signed)
To recovery, report to RN, VSS. 

## 2017-12-23 NOTE — Progress Notes (Signed)
Pt's states no medical or surgical changes since previsit or office visit. 

## 2017-12-23 NOTE — Op Note (Signed)
[facility] Patient Name: [name] Procedure Date: 12/23/2017 9:01 AM MRN: [id number] Endoscopist: Docia Chuck. Henrene Pastor , MD Age: 62 Referring MD:  Date of Birth: 12/08/1955 Gender: Male Account #: [id number] Procedure:                Colonoscopy Indications:              Screening for colorectal malignant neoplasm.                            Negative index exam August 2009 Medicines:                Monitored Anesthesia Care Procedure:                Pre-Anesthesia Assessment:                           - Prior to the procedure, a History and Physical                            was performed, and patient medications and                            allergies were reviewed. The patient's tolerance of                            previous anesthesia was also reviewed. The risks                            and benefits of the procedure and the sedation                            options and risks were discussed with the patient.                            All questions were answered, and informed consent                            was obtained. Prior Anticoagulants: The patient has                            taken no previous anticoagulant or antiplatelet                            agents. ASA Grade Assessment: I - A normal, healthy                            patient. After reviewing the risks and benefits,                            the patient was deemed in satisfactory condition to                            undergo the procedure.  After obtaining informed consent, the colonoscope                            was passed under direct vision. Throughout the                            procedure, the patient's blood pressure, pulse, and                            oxygen saturations were monitored continuously. The                            Colonoscope was introduced through the anus and                            advanced to the the cecum, identified by                       appendiceal orifice and ileocecal valve. The                            ileocecal valve, appendiceal orifice, and rectum                            were photographed. The quality of the bowel                            preparation was excellent. The colonoscopy was                            performed without difficulty. The patient tolerated                            the procedure well. The bowel preparation used was                            Plenvu. Scope In: 9:09:30 AM Scope Out: 9:28:12 AM Scope Withdrawal Time: 0 hours 11 minutes 9 seconds  Total Procedure Duration: 0 hours 18 minutes 42 seconds  Findings:                 A few diverticula were found in the sigmoid colon.                           Internal hemorrhoids were found during retroflexion.                           The exam was otherwise without abnormality on                            direct and retroflexion views. Complications:            No immediate complications. Estimated blood loss:                            None. Estimated Blood Loss:  Estimated blood loss: none. Impression:               - Diverticulosis in the sigmoid colon.                           - Internal hemorrhoids.                           - The examination was otherwise normal on direct                            and retroflexion views.                           - No specimens collected. Recommendation:           - Repeat colonoscopy in 10 years for screening                            purposes.                           - Patient has a contact number available for                            emergencies. The signs and symptoms of potential                            delayed complications were discussed with the                            patient. Return to normal activities tomorrow.                            Written discharge instructions were provided to the                            patient.                            - Resume previous diet.                           - Continue present medications. Docia Chuck. Henrene Pastor, MD 12/23/2017 9:35:22 AM This report has been signed electronically.

## 2017-12-23 NOTE — Patient Instructions (Addendum)
Thank you for allowing [place] to care for you today!  Resume previous medications and diet.  Return to normal activities tomorrow.  Repeat colonoscopy in 10 years for screening.  Handouts provided for diverticulosis and hemorrhoids.   YOU HAD AN ENDOSCOPIC PROCEDURE TODAY AT [facility]:   Refer to the procedure report that was given to you for any specific questions about what was found during the examination.  If the procedure report does not answer your questions, please call your gastroenterologist to clarify.  If you requested that your care partner not be given the details of your procedure findings, then the procedure report has been included in a sealed envelope for you to review at your convenience later.  YOU SHOULD EXPECT: Some feelings of bloating in the abdomen. Passage of more gas than usual.  Walking can help get rid of the air that was put into your GI tract during the procedure and reduce the bloating. If you had a lower endoscopy (such as a colonoscopy or flexible sigmoidoscopy) you may notice spotting of blood in your stool or on the toilet paper. If you underwent a bowel prep for your procedure, you may not have a normal bowel movement for a few days.  Please Note:  You might notice some irritation and congestion in your nose or some drainage.  This is from the oxygen used during your procedure.  There is no need for concern and it should clear up in a day or so.  SYMPTOMS TO REPORT IMMEDIATELY:   Following lower endoscopy (colonoscopy or flexible sigmoidoscopy):  Excessive amounts of blood in the stool  Significant tenderness or worsening of abdominal pains  Swelling of the abdomen that is new, acute  Fever of 100F or higher   For urgent or emergent issues, a gastroenterologist can be reached at any hour by calling [phone].   DIET:  We do recommend a small meal at first, but then you may proceed to your regular diet.  Drink plenty of fluids but  you should avoid alcoholic beverages for 24 hours.  ACTIVITY:  You should plan to take it easy for the rest of today and you should NOT DRIVE or use heavy machinery until tomorrow (because of the sedation medicines used during the test).    FOLLOW UP: Our staff will call the number listed on your records the next business day following your procedure to check on you and address any questions or concerns that you may have regarding the information given to you following your procedure. If we do not reach you, we will leave a message.  However, if you are feeling well and you are not experiencing any problems, there is no need to return our call.  We will assume that you have returned to your regular daily activities without incident.  If any biopsies were taken you will be contacted by phone or by letter within the next 1-3 weeks.  Please call us at [phone] if you have not heard about the biopsies in 3 weeks.    SIGNATURES/CONFIDENTIALITY: You and/or your care partner have signed paperwork which will be entered into your electronic medical record.  These signatures attest to the fact that that the information above on your After Visit Summary has been reviewed and is understood.  Full responsibility of the confidentiality of this discharge information lies with you and/or your care-partner.

## 2017-12-24 ENCOUNTER — Telehealth: Payer: Self-pay

## 2017-12-24 NOTE — Telephone Encounter (Signed)
  Follow up Call-  Call back number 12/23/2017  Post procedure Call Back phone  # [phone]  Permission to leave phone message Yes  Some recent data might be hidden     Patient questions:  Do you have a fever, pain , or abdominal swelling? No. Pain Score  0 *  Have you tolerated food without any problems? Yes.    Have you been able to return to your normal activities? Yes.    Do you have any questions about your discharge instructions: Diet   No. Medications  No. Follow up visit  No.  Do you have questions or concerns about your Care? No.  Actions: * If pain score is 4 or above: No action needed, pain <4.  No problems noted per pt.

## 2018-01-16 ENCOUNTER — Encounter: Payer: Self-pay | Admitting: Internal Medicine

## 2018-01-16 ENCOUNTER — Ambulatory Visit (INDEPENDENT_AMBULATORY_CARE_PROVIDER_SITE_OTHER): Payer: BC Managed Care – PPO | Admitting: Internal Medicine

## 2018-01-16 VITALS — BP 124/82 | HR 61 | Temp 98.2°F | Resp 14 | Ht 73.0 in | Wt 199.0 lb

## 2018-01-16 DIAGNOSIS — Z87820 Personal history of traumatic brain injury: Secondary | ICD-10-CM

## 2018-01-16 DIAGNOSIS — I341 Nonrheumatic mitral (valve) prolapse: Secondary | ICD-10-CM | POA: Diagnosis not present

## 2018-01-16 DIAGNOSIS — Z23 Encounter for immunization: Secondary | ICD-10-CM

## 2018-01-16 DIAGNOSIS — Z0000 Encounter for general adult medical examination without abnormal findings: Secondary | ICD-10-CM | POA: Diagnosis not present

## 2018-01-16 MED ORDER — MONTELUKAST SODIUM 10 MG PO TABS: ORAL_TABLET | ORAL | 3 refills | Status: DC

## 2018-01-16 NOTE — Patient Instructions (Signed)
Good to see you!   Health Maintenance, Male A healthy lifestyle and preventive care is important for your health and wellness. Ask your health care provider about what schedule of regular examinations is right for you. What should I know about weight and diet? Eat a Healthy Diet  Eat plenty of vegetables, fruits, whole grains, low-fat dairy products, and lean protein.  Do not eat a lot of foods high in solid fats, added sugars, or salt.  Maintain a Healthy Weight Regular exercise can help you achieve or maintain a healthy weight. You should:  Do at least 150 minutes of exercise each week. The exercise should increase your heart rate and make you sweat (moderate-intensity exercise).  Do strength-training exercises at least twice a week.  Watch Your Levels of Cholesterol and Blood Lipids  Have your blood tested for lipids and cholesterol every 5 years starting at 61 years of age. If you are at high risk for heart disease, you should start having your blood tested when you are 61 years old. You may need to have your cholesterol levels checked more often if: ? Your lipid or cholesterol levels are high. ? You are older than 61 years of age. ? You are at high risk for heart disease.  What should I know about cancer screening? Many types of cancers can be detected early and may often be prevented. Lung Cancer  You should be screened every year for lung cancer if: ? You are a current smoker who has smoked for at least 30 years. ? You are a former smoker who has quit within the past 15 years.  Talk to your health care provider about your screening options, when you should start screening, and how often you should be screened.  Colorectal Cancer  Routine colorectal cancer screening usually begins at 61 years of age and should be repeated every 5-10 years until you are 61 years old. You may need to be screened more often if early forms of precancerous polyps or small growths are found.  Your health care provider may recommend screening at an earlier age if you have risk factors for colon cancer.  Your health care provider may recommend using home test kits to check for hidden blood in the stool.  A small camera at the end of a tube can be used to examine your colon (sigmoidoscopy or colonoscopy). This checks for the earliest forms of colorectal cancer.  Prostate and Testicular Cancer  Depending on your age and overall health, your health care provider may do certain tests to screen for prostate and testicular cancer.  Talk to your health care provider about any symptoms or concerns you have about testicular or prostate cancer.  Skin Cancer  Check your skin from head to toe regularly.  Tell your health care provider about any new moles or changes in moles, especially if: ? There is a change in a mole's size, shape, or color. ? You have a mole that is larger than a pencil eraser.  Always use sunscreen. Apply sunscreen liberally and repeat throughout the day.  Protect yourself by wearing long sleeves, pants, a wide-brimmed hat, and sunglasses when outside.  What should I know about heart disease, diabetes, and high blood pressure?  If you are 101-64 years of age, have your blood pressure checked every 3-5 years. If you are 57 years of age or older, have your blood pressure checked every year. You should have your blood pressure measured twice-once when you are at a  hospital or clinic, and once when you are not at a hospital or clinic. Record the average of the two measurements. To check your blood pressure when you are not at a hospital or clinic, you can use: ? An automated blood pressure machine at a pharmacy. ? A home blood pressure monitor.  Talk to your health care provider about your target blood pressure.  If you are between 45-79 years old, ask your health care provider if you should take aspirin to prevent heart disease.  Have regular diabetes screenings by  checking your fasting blood sugar level. ? If you are at a normal weight and have a low risk for diabetes, have this test once every three years after the age of 45. ? If you are overweight and have a high risk for diabetes, consider being tested at a younger age or more often.  A one-time screening for abdominal aortic aneurysm (AAA) by ultrasound is recommended for men aged 65-75 years who are current or former smokers. What should I know about preventing infection? Hepatitis B If you have a higher risk for hepatitis B, you should be screened for this virus. Talk with your health care provider to find out if you are at risk for hepatitis B infection. Hepatitis C Blood testing is recommended for:  Everyone born from 1945 through 1965.  Anyone with known risk factors for hepatitis C.  Sexually Transmitted Diseases (STDs)  You should be screened each year for STDs including gonorrhea and chlamydia if: ? You are sexually active and are younger than 61 years of age. ? You are older than 61 years of age and your health care provider tells you that you are at risk for this type of infection. ? Your sexual activity has changed since you were last screened and you are at an increased risk for chlamydia or gonorrhea. Ask your health care provider if you are at risk.  Talk with your health care provider about whether you are at high risk of being infected with HIV. Your health care provider may recommend a prescription medicine to help prevent HIV infection.  What else can I do?  Schedule regular health, dental, and eye exams.  Stay current with your vaccines (immunizations).  Do not use any tobacco products, such as cigarettes, chewing tobacco, and e-cigarettes. If you need help quitting, ask your health care provider.  Limit alcohol intake to no more than 2 drinks per day. One drink equals 12 ounces of beer, 5 ounces of wine, or 1 ounces of hard liquor.  Do not use street drugs.  Do not  share needles.  Ask your health care provider for help if you need support or information about quitting drugs.  Tell your health care provider if you often feel depressed.  Tell your health care provider if you have ever been abused or do not feel safe at home. This information is not intended to replace advice given to you by your health care provider. Make sure you discuss any questions you have with your health care provider. Document Released: 09/15/2007 Document Revised: 11/16/2015 Document Reviewed: 12/21/2014 Elsevier Interactive Patient Education  2018 [employer]  
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
## 2018-01-18 DIAGNOSIS — Z87820 Personal history of traumatic brain injury: Secondary | ICD-10-CM

## 2018-01-18 HISTORY — DX: Personal history of traumatic brain injury: Z87.820
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
## 2018-01-18 NOTE — Assessment & Plan Note (Signed)

## 2018-01-18 NOTE — Assessment & Plan Note (Addendum)
He is asymptomatic,  And declines use of ace inhibitor for A/L reduction .  Last ECHO was unchanged and done in 2018.

## 2018-02-18 ENCOUNTER — Ambulatory Visit (INDEPENDENT_AMBULATORY_CARE_PROVIDER_SITE_OTHER): Payer: BC Managed Care – PPO

## 2018-02-18 DIAGNOSIS — Z23 Encounter for immunization: Secondary | ICD-10-CM | POA: Diagnosis not present

## 2018-02-19 ENCOUNTER — Telehealth: Payer: Self-pay | Admitting: Family Medicine

## 2018-02-19 DIAGNOSIS — R361 Hematospermia: Secondary | ICD-10-CM | POA: Insufficient documentation

## 2018-02-19 DIAGNOSIS — Z125 Encounter for screening for malignant neoplasm of prostate: Secondary | ICD-10-CM

## 2018-02-19 HISTORY — DX: Hematospermia: R36.1

## 2018-02-19 NOTE — Telephone Encounter (Signed)
Thanks for the [place] , and thanks for seeing him  Helene Kelp

## 2018-02-19 NOTE — Telephone Encounter (Signed)
Late entry.  Conversation with Dr. Silvio Pate at office.  Patient had episode of bloody ejaculate on 11/16.  Single event.  No symptoms otherwise.  No dysuria.  He still feels well.  He looked back at the guidelines on this prior to talking to me.  We talked about the fact that it is usually a low risk an isolated issue.  I have had other patients with similar episodes and we referred them to urology for peace of mind but they were fine and did not need additional work-up.  We talked about the pros and cons of checking a PSA in the relatively near future.  He is aware of false positives with PSAs.  We talked about referral to urology if needed/desired.  We agreed to check a PSA at the end of the week and hold off on urology referral at this point.  I will put in the order for the lab.  I will route this to his PCP as FYI.

## 2018-02-20 ENCOUNTER — Other Ambulatory Visit (INDEPENDENT_AMBULATORY_CARE_PROVIDER_SITE_OTHER): Payer: BC Managed Care – PPO

## 2018-02-20 DIAGNOSIS — Z125 Encounter for screening for malignant neoplasm of prostate: Secondary | ICD-10-CM | POA: Diagnosis not present

## 2018-02-20 LAB — PSA: PSA: 0.83 ng/mL (ref 0.10–4.00)

## 2018-03-12 ENCOUNTER — Telehealth: Payer: Self-pay | Admitting: Family Medicine

## 2018-03-12 MED ORDER — AMOXICILLIN-POT CLAVULANATE 875-125 MG PO TABS: 1.0000 | ORAL_TABLET | Freq: Two times a day (BID) | ORAL | 0 refills | Status: AC

## 2018-03-12 NOTE — Telephone Encounter (Signed)
Chronic ear probs with persistent URI, > 2 weeks. Will send in some augmentin to hold if sx worsen.

## 2018-04-29 ENCOUNTER — Telehealth: Payer: Self-pay | Admitting: Family Medicine

## 2018-04-29 MED ORDER — AMOXICILLIN 500 MG PO CAPS: 500.0000 mg | ORAL_CAPSULE | Freq: Two times a day (BID) | ORAL | 0 refills | Status: DC

## 2018-04-29 NOTE — Telephone Encounter (Signed)
I saw [name] grandchildren yesterday, both with dx strep throat. Now [name] is starting to develop sore throat, malaise.  Will send in amoxicillin course to treat possible strep.

## 2018-05-09 MED ORDER — AMOXICILLIN-POT CLAVULANATE 875-125 MG PO TABS: 1.0000 | ORAL_TABLET | Freq: Two times a day (BID) | ORAL | 0 refills | Status: DC

## 2018-05-09 NOTE — Addendum Note (Signed)
Addended by: Tonia Ghent on: 05/09/2018 08:04 AM   Modules accepted: Orders

## 2018-05-09 NOTE — Telephone Encounter (Signed)
D/w pt.  Now with more sinus pressure and ST not improved.  D/w pt about options.  rx changed to augmentin, sent.

## 2018-05-23 ENCOUNTER — Other Ambulatory Visit: Payer: Self-pay | Admitting: Family Medicine

## 2018-05-23 MED ORDER — AMOXICILLIN-POT CLAVULANATE 875-125 MG PO TABS: 1.0000 | ORAL_TABLET | Freq: Two times a day (BID) | ORAL | 0 refills | Status: DC

## 2018-05-23 NOTE — Progress Notes (Signed)
D/w pt.  He is improved but not fully resolved from URI sx.  He'll hold augmentin and restart if worse in the meantime.

## 2018-08-26 ENCOUNTER — Ambulatory Visit (INDEPENDENT_AMBULATORY_CARE_PROVIDER_SITE_OTHER): Payer: BC Managed Care – PPO

## 2018-08-26 DIAGNOSIS — Z23 Encounter for immunization: Secondary | ICD-10-CM

## 2018-12-24 ENCOUNTER — Other Ambulatory Visit: Payer: Self-pay | Admitting: Occupational Medicine

## 2018-12-25 LAB — CBC WITH DIFFERENTIAL/PLATELET
Absolute Monocytes: 533 cells/uL (ref 200–950)
Basophils Absolute: 39 cells/uL (ref 0–200)
Basophils Relative: 0.6 %
Eosinophils Absolute: 52 cells/uL (ref 15–500)
Eosinophils Relative: 0.8 %
HCT: 45.8 % (ref 38.5–50.0)
Hemoglobin: 15.4 g/dL (ref 13.2–17.1)
Lymphs Abs: 2178 cells/uL (ref 850–3900)
MCH: 30.7 pg (ref 27.0–33.0)
MCHC: 33.6 g/dL (ref 32.0–36.0)
MCV: 91.4 fL (ref 80.0–100.0)
MPV: 9.7 fL (ref 7.5–12.5)
Monocytes Relative: 8.2 %
Neutro Abs: 3699 cells/uL (ref 1500–7800)
Neutrophils Relative %: 56.9 %
Platelets: 242 10*3/uL (ref 140–400)
RBC: 5.01 10*6/uL (ref 4.20–5.80)
RDW: 12.3 % (ref 11.0–15.0)
Total Lymphocyte: 33.5 %
WBC: 6.5 10*3/uL (ref 3.8–10.8)

## 2018-12-25 LAB — COMPLETE METABOLIC PANEL WITH GFR
AG Ratio: 1.9 (calc) (ref 1.0–2.5)
ALT: 27 U/L (ref 9–46)
AST: 30 U/L (ref 10–35)
Albumin: 4.5 g/dL (ref 3.6–5.1)
Alkaline phosphatase (APISO): 49 U/L (ref 35–144)
BUN: 17 mg/dL (ref 7–25)
CO2: 25 mmol/L (ref 20–32)
Calcium: 9.9 mg/dL (ref 8.6–10.3)
Chloride: 102 mmol/L (ref 98–110)
Creat: 0.88 mg/dL (ref 0.70–1.25)
GFR, Est African American: 107 mL/min/{1.73_m2} (ref >=60)
GFR, Est Non African American: 93 mL/min/{1.73_m2} (ref >=60)
Globulin: 2.4 g/dL (calc) (ref 1.9–3.7)
Glucose, Bld: 85 mg/dL (ref 65–99)
Potassium: 4.4 mmol/L (ref 3.5–5.3)
Sodium: 137 mmol/L (ref 135–146)
Total Bilirubin: 0.8 mg/dL (ref 0.2–1.2)
Total Protein: 6.9 g/dL (ref 6.1–8.1)

## 2018-12-25 LAB — LIPID PANEL
Cholesterol: 230 mg/dL — ABNORMAL HIGH (ref <200)
HDL: 75 mg/dL (ref >=40)
LDL Cholesterol (Calc): 130 mg/dL (calc) — ABNORMAL HIGH
Non-HDL Cholesterol (Calc): 155 mg/dL (calc) — ABNORMAL HIGH (ref <130)
Total CHOL/HDL Ratio: 3.1 (calc) (ref <5.0)
Triglycerides: 133 mg/dL (ref <150)

## 2018-12-25 LAB — PSA: PSA: 0.8 ng/mL (ref <=4.0)

## 2019-01-22 ENCOUNTER — Ambulatory Visit (INDEPENDENT_AMBULATORY_CARE_PROVIDER_SITE_OTHER): Payer: BC Managed Care – PPO | Admitting: Internal Medicine

## 2019-01-22 ENCOUNTER — Encounter: Payer: Self-pay | Admitting: Internal Medicine

## 2019-01-22 ENCOUNTER — Other Ambulatory Visit: Payer: Self-pay

## 2019-01-22 VITALS — BP 132/74 | HR 72 | Temp 96.7°F | Resp 14 | Ht 73.0 in | Wt 201.2 lb

## 2019-01-22 DIAGNOSIS — R361 Hematospermia: Secondary | ICD-10-CM | POA: Diagnosis not present

## 2019-01-22 DIAGNOSIS — I341 Nonrheumatic mitral (valve) prolapse: Secondary | ICD-10-CM

## 2019-01-22 DIAGNOSIS — Z0000 Encounter for general adult medical examination without abnormal findings: Secondary | ICD-10-CM

## 2019-01-22 DIAGNOSIS — E785 Hyperlipidemia, unspecified: Secondary | ICD-10-CM | POA: Diagnosis not present

## 2019-01-22 MED ORDER — ALBUTEROL SULFATE HFA 108 (90 BASE) MCG/ACT IN AERS: 1.0000 | INHALATION_SPRAY | Freq: Four times a day (QID) | RESPIRATORY_TRACT | 2 refills | Status: DC | PRN

## 2019-01-22 MED ORDER — MONTELUKAST SODIUM 10 MG PO TABS: ORAL_TABLET | ORAL | 3 refills | Status: DC

## 2019-01-22 NOTE — Assessment & Plan Note (Signed)
Recently reported.  No symptoms of prostatitis.  No further workup at this time.

## 2019-01-22 NOTE — Patient Instructions (Signed)
[phone]  Echo ordered

## 2019-01-22 NOTE — Assessment & Plan Note (Signed)
Repeat ECHO needed for assessment of right sided pressures given patient's perception of decreased performance/endurance

## 2019-01-22 NOTE — Assessment & Plan Note (Signed)

## 2019-01-22 NOTE — Progress Notes (Signed)
Patient ID: [name], male    DOB: 1955/05/15  Age: 63 y.o. MRN: [id number]  The patient is here for annual preventive  examination and management of other chronic and acute problems.    Wears a hearing aide.  History of Stapes removal otosclerotic post traumatic .  No change in hearing   Colonoscopy 2019 : diverticulosis    The risk factors are reflected in the social history.  The roster of all physicians providing medical care to patient - is listed in the Snapshot section of the chart.  Activities of daily living:  The patient is 100% independent in all ADLs: dressing, toileting, feeding as well as independent mobility  Home safety : The patient has smoke detectors in the home. They wear seatbelts.  There are no firearms at home. There is no violence in the home.   There is no risks for hepatitis, STDs or HIV. There is no   history of blood transfusion. They have no travel history to infectious disease endemic areas of the world.  The patient has seen their dentist in the last six month. They have seen their eye doctor in the last year. They admit to slight hearing difficulty with regard to whispered voices and some television programs.  They have deferred audiologic testing in the last year.  They do not  have excessive sun exposure. Discussed the need for sun protection: hats, long sleeves and use of sunscreen if there is significant sun exposure.   Diet: the importance of a healthy diet is discussed. They do have a healthy diet.  The benefits of regular aerobic exercise were discussed. He exercises vigorously 5 days per week .   Depression screen: there are no signs or vegative symptoms of depression- irritability, change in appetite, anhedonia, sadness/tearfullness.  The following portions of the patient's history were reviewed and updated as appropriate: allergies, current medications, past family history, past medical history,  past surgical history, past social history   and problem list.  Visual acuity was not assessed per patient preference since she has regular follow up with her ophthalmologist. Hearing and body mass index were assessed and reviewed.   During the course of the visit the patient was educated and counseled about appropriate screening and preventive services including : fall prevention , diabetes screening, nutrition counseling, colorectal cancer screening, and recommended immunizations.    CC: The primary encounter diagnosis was Mitral valve prolapse. Diagnoses of Visit for preventive health examination, Hyperlipidemia LDL goal <130, and Hematospermia were also pertinent to this visit.  History [name] has a past medical history of ALLERGIC RHINITIS (11/25/2008), Allergy, Hearing loss, right (04/11/2012), Mitral regurgitation, myxomatous (04/11/2012), and Mitral valve prolapse (04/11/2012).   He has a past surgical history that includes Tonsillectomy; Ossicular reconstruction; Echo (other); Bilateral laparoscopic hernia repairs (1992); Echo (other) (06/21/2000); Echo (other) (07/26/2006); Colonoscopy (11/17/07); Nasal Septoplasty (other) (2008); and Echo (other) (06/15/2009).   His family history includes Breast cancer in his mother; Breast cancer (age of onset: 24) in his sister; Congestive Heart Failure in his father; Diabetes in his father; Hypertension in his mother; Hyperthyroidism in his sister; Peripheral Artery Disease (age of onset: 82) in his father; Prostate cancer in his brother.He reports that he has never smoked. He has never used smokeless tobacco. He reports current alcohol use of about 19.0 standard drinks of alcohol per week. He reports that he does not use drugs.  Outpatient Medications Prior to Visit  Medication Sig Dispense Refill  . cetirizine (ZYRTEC ALLERGY)  10 MG tablet Take 10 mg by mouth daily.      . fexofenadine (ALLEGRA) 180 MG tablet Take 180 mg by mouth as needed.     . loratadine (CLARITIN) 10 MG tablet Take 10 mg by  mouth daily.      . naproxen sodium (ANAPROX) 220 MG tablet Take 440 mg by mouth 3 (three) times a week.     Marland Kitchen albuterol (PROAIR HFA) 108 (90 Base) MCG/ACT inhaler Inhale 1-2 puffs into the lungs every 6 (six) hours as needed for wheezing or shortness of breath. 1 Inhaler 12  . montelukast (SINGULAIR) 10 MG tablet TAKE ONE (1) TABLET BY MOUTH EVERY DAY 90 tablet 3  . triamcinolone cream (KENALOG) 0.1 % Apply 1 application topically 2 (two) times daily. (Patient not taking: Reported on 01/22/2019) 45 g 1  . amoxicillin-clavulanate (AUGMENTIN) 875-125 MG tablet Take 1 tablet by mouth 2 (two) times daily. (Patient not taking: Reported on 01/22/2019) 20 tablet 0   No facility-administered medications prior to visit.     Review of Systems   Patient denies headache, fevers, malaise, unintentional weight loss, skin rash, eye pain, sinus congestion and sinus pain, sore throat, dysphagia,  hemoptysis , cough, dyspnea, wheezing, chest pain, palpitations, orthopnea, edema, abdominal pain, nausea, melena, diarrhea, constipation, flank pain, dysuria, hematuria, urinary  Frequency, nocturia, numbness, tingling, seizures,  Focal weakness, Loss of consciousness,  Tremor, insomnia, depression, anxiety, and suicidal ideation.      Objective:  BP 132/74 (BP Location: Left Arm, Patient Position: Sitting, Cuff Size: Normal)   Pulse 72   Temp (!) 96.7 F (35.9 C) (Temporal)   Resp 14   Ht 6\' 1"  (1.854 m)   Wt 201 lb 3.2 oz (91.3 kg)   SpO2 98%   BMI 26.55 kg/m   Physical Exam  General appearance: alert, cooperative and appears stated age Ears: normal TM's and external ear canals both ears Throat: lips, mucosa, and tongue normal; teeth and gums normal Neck: no adenopathy, no carotid bruit, supple, symmetrical, trachea midline and thyroid not enlarged, symmetric, no tenderness/mass/nodules Back: symmetric, no curvature. ROM normal. No CVA tenderness. Lungs: clear to auscultation bilaterally Heart:  regular rate and rhythm, S1, S2 normal, no murmur, click, rub or gallop Abdomen: soft, non-tender; bowel sounds normal; no masses,  no organomegaly Pulses: 2+ and symmetric Skin: Skin color, texture, turgor normal. No rashes or lesions Lymph nodes: Cervical, supraclavicular, and axillary nodes normal.  Assessment & Plan:   Problem List Items Addressed This Visit      Unprioritized   Mitral valve prolapse - Primary    Repeat ECHO needed for assessment of right sided pressures given patient's perception of decreased performance/endurance       Relevant Orders   ECHOCARDIOGRAM COMPLETE   Visit for preventive health examination    age appropriate education and counseling updated, referrals for preventative services and immunizations addressed, dietary and smoking counseling addressed, most recent labs reviewed.  I have personally reviewed and have noted:  1) the patient's medical and social history 2) The pt's use of alcohol, tobacco, and illicit drugs 3) The patient's current medications and supplements 4) Functional ability including ADL's, fall risk, home safety risk, hearing and visual impairment 5) Diet and physical activities 6) Evidence for depression or mood disorder 7) The patient's height, weight, and BMI have been recorded in the chart  I have made referrals, and provided counseling and education based on review of the above      Hyperlipidemia LDL  goal <130    10 yr  Risk using FRC is 12 %.  Statin therapy deferred  Lab Results  Component Value Date   CHOL 230 (H) 12/24/2018   HDL 75 12/24/2018   LDLCALC 130 (H) 12/24/2018   LDLDIRECT 130.1 04/10/2012   TRIG 133 12/24/2018   CHOLHDL 3.1 12/24/2018        Hematospermia    Recently reported.  No symptoms of prostatitis.  No further workup at this time.          I have discontinued Katlin I. Nolet's amoxicillin-clavulanate. I am also having him maintain his fexofenadine, loratadine, cetirizine, naproxen  sodium, triamcinolone cream, albuterol, and montelukast.  Meds ordered this encounter  Medications  . albuterol (PROAIR HFA) 108 (90 Base) MCG/ACT inhaler    Sig: Inhale 1-2 puffs into the lungs every 6 (six) hours as needed for wheezing or shortness of breath.    Dispense:  8 g    Refill:  2  . montelukast (SINGULAIR) 10 MG tablet    Sig: TAKE ONE (1) TABLET BY MOUTH EVERY DAY    Dispense:  90 tablet    Refill:  3    Medications Discontinued During This Encounter  Medication Reason  . amoxicillin-clavulanate (AUGMENTIN) 875-125 MG tablet Completed Course  . albuterol (PROAIR HFA) 108 (90 Base) MCG/ACT inhaler Reorder  . montelukast (SINGULAIR) 10 MG tablet Reorder    Follow-up: No follow-ups on file.   Crecencio Mc, MD

## 2019-01-22 NOTE — Assessment & Plan Note (Signed)
11 yr  Risk using FRC is 12 %.  Statin therapy deferred  Lab Results  Component Value Date   CHOL 230 (H) 12/24/2018   HDL 75 12/24/2018   LDLCALC 130 (H) 12/24/2018   LDLDIRECT 130.1 04/10/2012   TRIG 133 12/24/2018   CHOLHDL 3.1 12/24/2018

## 2019-03-17 ENCOUNTER — Other Ambulatory Visit: Payer: Self-pay

## 2019-03-17 ENCOUNTER — Ambulatory Visit (INDEPENDENT_AMBULATORY_CARE_PROVIDER_SITE_OTHER): Payer: BC Managed Care – PPO

## 2019-03-17 DIAGNOSIS — I341 Nonrheumatic mitral (valve) prolapse: Secondary | ICD-10-CM

## 2019-04-12 DIAGNOSIS — Z23 Encounter for immunization: Secondary | ICD-10-CM | POA: Diagnosis not present

## 2019-04-29 ENCOUNTER — Other Ambulatory Visit: Payer: Self-pay

## 2019-04-29 ENCOUNTER — Encounter: Payer: Self-pay | Admitting: Cardiovascular Disease

## 2019-04-29 ENCOUNTER — Ambulatory Visit: Payer: 59 | Admitting: Cardiovascular Disease

## 2019-04-29 VITALS — BP 162/92 | HR 58 | Ht 74.0 in | Wt 204.8 lb

## 2019-04-29 DIAGNOSIS — I340 Nonrheumatic mitral (valve) insufficiency: Secondary | ICD-10-CM

## 2019-04-29 DIAGNOSIS — I341 Nonrheumatic mitral (valve) prolapse: Secondary | ICD-10-CM | POA: Diagnosis not present

## 2019-04-29 NOTE — H&P (View-Only) (Signed)
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  Electrophysiologist:  None   Referring MD: Crecencio Mc, MD   No chief complaint on file.   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  He has no history of rheumatic fever.  He was first noted to have a heart murmur in 1991 when he underwent an exam as part of his medical training.  An echocardiogram at that time demonstrated mitral valve prolapse and mild mitral regurgitation.  The patient followed with Dr. Verl Blalock and underwent surveillance echo testing with ongoing observation recommended in the setting of his asymptomatic status and normal LV function.  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  He walks at a brisk pace with no exertional symptoms.  He rides his bike frequently for exercise at a pace of 17 mph.  He has noticed that he becomes short of breath with riding his bike up a steep hill, moreso than in the past. Otherwise he is asymptomatic at a good workload. He is able to walk 4-5 miles at a 4 mph pace.  He denies chest pain or pressure, orthopnea, PND, leg swelling, or heart palpitations.  Past Medical History:  Diagnosis Date  . ALLERGIC RHINITIS 11/25/2008  . Allergy   . Atypical nevus of scapular region 01/12/2017  . Hearing loss, right 04/11/2012   Multiple surgeries in the past, partial hearing loss  . Hematospermia 02/19/2018  . History of brain concussion 01/18/2018  . Hyperlipidemia LDL goal <130 01/10/2016  . Lichen planus [id number]   Qualifier: Diagnosis of  By: Council Mechanic MD, Hilaria Ota   . Mitral regurgitation, myxomatous 04/11/2012  . Mitral valve prolapse 04/11/2012   Severe, annual echos    Past Surgical History:  Procedure  Laterality Date  . Bilateral laparoscopic hernia repairs  1992  . COLONOSCOPY  11/17/07   Dr. Henrene Pastor  . Echo (other)     positive for MVP, markedly myxomatous, marked prolapse TVP with Regurg (03/1990)  . Echo (other)  06/21/2000   mitral valve thickened, myxomatous vs degenerative, moderate MVP, moderate MR  . Echo (other)  07/26/2006   LVF normal, EF 55-65%, Triv AR mod MR, marked posterior leaflet MVP  . Echo (other)  06/15/2009   no change (EF 55-60% Mild Myxomatous Mitral Valve Mod MVP mod MR Mild LAE)  . Nasal Septoplasty (other)  2008   Dr. Janace Hoard  . OSSICULAR RECONSTRUCTION     right ear  . TONSILLECTOMY     age 53    Current Medications: Current Meds  Medication Sig  . albuterol (PROAIR HFA) 108 (90 Base) MCG/ACT inhaler Inhale 1-2 puffs into the lungs every 6 (six) hours as needed for wheezing or shortness of breath.  . cetirizine (ZYRTEC ALLERGY) 10 MG tablet Take 10 mg by mouth every evening.   . fexofenadine (ALLEGRA) 180 MG tablet Take 180 mg by mouth daily as needed (allergies.).   Marland Kitchen loratadine (CLARITIN) 10 MG tablet Take 10 mg by mouth every evening.   . montelukast (SINGULAIR) 10 MG tablet TAKE ONE (1) TABLET BY MOUTH EVERY DAY (Patient taking differently: Take 10 mg by mouth every evening. )  . naproxen  sodium (ANAPROX) 220 MG tablet Take 220-440 mg by mouth daily as needed (pain).   . triamcinolone cream (KENALOG) 0.1 % Apply 1 application topically 2 (two) times daily. (Patient taking differently: Apply 1 application topically 2 (two) times daily as needed (skin irritation/bug bites.). )     Allergies:   Patient has no known allergies.   Social History   Socioeconomic History  . Marital status: Married    Spouse name: Manuela Schwartz  . Number of children: 2  . Years of education: Not on file  . Highest education level: Not on file  Occupational History  . 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 Smoking  status: Never Smoker  . Smokeless tobacco: Never Used  Substance and Sexual Activity  . Alcohol use: Yes    Alcohol/week: 19.0 standard drinks    Types: 19 Glasses of wine per week  . Drug use: No  . Sexual activity: Yes    Partners: Female  Other Topics Concern  . Not on file  Social History Narrative   Physician.   Married to [place]   2 Children   Daughter: Vladimir Creeks   1 son, Shanon Brow, married with child   Social Determinants of Health   Financial Resource Strain:   . Difficulty of Paying Living Expenses: Not on file  Food Insecurity:   . Worried About Charity fundraiser in the Last Year: Not on file  . Ran Out of Food in the Last Year: Not on file  Transportation Needs:   . Lack of Transportation (Medical): Not on file  . Lack of Transportation (Non-Medical): Not on file  Physical Activity:   . Days of Exercise per Week: Not on file  . Minutes of Exercise per Session: Not on file  Stress:   . Feeling of Stress : Not on file  Social Connections:   . Frequency of Communication with Friends and Family: Not on file  . Frequency of Social Gatherings with Friends and Family: Not on file  . Attends Religious Services: Not on file  . Active Member of Clubs or Organizations: Not on file  . Attends Archivist Meetings: Not on file  . Marital Status: Not on file     Family History: The patient's family history includes Breast cancer in his mother; Breast cancer (age of onset: 12) in his sister; Congestive Heart Failure in his father; Diabetes in his father; Hypertension in his mother; Hyperthyroidism in his sister; Peripheral Artery Disease (age of onset: 24) in his father; Prostate cancer in his brother. There is no history of Colon cancer, Colon polyps, Esophageal cancer, Stomach cancer, or Rectal cancer.  ROS:   Please see the history of present illness.    All other systems reviewed and are negative.  EKGs/Labs/Other Studies Reviewed:    The following studies were  reviewed today: Echo 03-17-2019: IMPRESSIONS    1. Left ventricular ejection fraction, by visual estimation, is 60 to 65%. The left ventricle has normal function. There is mildly increased left ventricular hypertrophy.  2. The left ventricle has no regional wall motion abnormalities.  3. Global right ventricle has normal systolic function.The right ventricular size is normal. No increase in right ventricular wall thickness.  4. Left atrial size was severely dilated.  5. Right atrial size was normal.  6. Severe mitral valve prolapse involving the posterior leaflet.  7. The mitral valve is myxomatous. Moderate to severe  mitral valve regurgitation. No evidence of mitral stenosis.  8. Degree of mitral regurgitation is difficult to assess due to jet eccentricity. Consider further evaluation with transesophageal echocardiogram, as clinically indicated.  9. The tricuspid valve is grossly normal. Tricuspid valve regurgitation mild-moderate. 10. The aortic valve is tricuspid. Aortic valve regurgitation is mild. No evidence of aortic valve sclerosis or stenosis. 11. The pulmonic valve was normal in structure. Pulmonic valve regurgitation is not visualized. 12. Normal pulmonary artery systolic pressure. 13. The inferior vena cava is dilated in size with >50% respiratory variability, suggesting right atrial pressure of 8 mmHg. 14. The interatrial septum was not well visualized.  FINDINGS  Left Ventricle: Left ventricular ejection fraction, by visual estimation, is 60 to 65%. The left ventricle has normal function. The left ventricle has no regional wall motion abnormalities. The left ventricular internal cavity size was the left  ventricle is normal in size. There is mildly increased left ventricular hypertrophy.  Right Ventricle: The right ventricular size is normal. No increase in right ventricular wall thickness. Global RV systolic function is has normal systolic function. The tricuspid  regurgitant velocity is 2.46 m/s, and with an assumed right atrial pressure  of 8 mmHg, the estimated right ventricular systolic pressure is normal at 32.2 mmHg.  Left Atrium: Left atrial size was severely dilated.  Right Atrium: Right atrial size was normal in size  Pericardium: There is no evidence of pericardial effusion.  Mitral Valve: The mitral valve is myxomatous. There is severe late systolic prolapse of the posterior leaflet of the mitral valve. Moderate to severe mitral valve regurgitation. No evidence of mitral valve stenosis by observation. Degree of mitral  regurgitation is difficult to assess due to jet eccentricity. Consider further evaluation with transesophageal echocardiogram, as clinically indicated.  Tricuspid Valve: The tricuspid valve is grossly normal. Tricuspid valve regurgitation mild-moderate.  Aortic Valve: The aortic valve is tricuspid. Aortic valve regurgitation is mild. The aortic valve is structurally normal, with no evidence of sclerosis or stenosis. Aortic valve mean gradient measures 4.0 mmHg. Aortic valve peak gradient measures 8.5  mmHg. Aortic valve area, by VTI measures 2.40 cm.  Pulmonic Valve: The pulmonic valve was normal in structure. Pulmonic valve regurgitation is not visualized. Pulmonic regurgitation is not visualized. No evidence of pulmonic stenosis.  Aorta: The aortic root and ascending aorta are structurally normal, with no evidence of dilitation.  Pulmonary Artery: The pulmonary artery is not well seen.  Venous: The inferior vena cava is dilated in size with greater than 50% respiratory variability, suggesting right atrial pressure of 8 mmHg.  IAS/Shunts: The interatrial septum was not well visualized.    LEFT VENTRICLE PLAX 2D LVIDd:         4.60 cm       Diastology LVIDs:         3.20 cm       LV e' lateral:   11.10 cm/s LV PW:         1.20 cm       LV E/e' lateral: 5.9 LV IVS:        1.20 cm       LV e' medial:     7.83 cm/s LVOT diam:     2.00 cm       LV E/e' medial:  8.4 LV SV:         56 ml LV SV Index:   25.87 LVOT Area:     3.14 cm   LV Volumes (MOD) LV area d,  A2C:    39.40 cm LV area d, A4C:    45.10 cm LV area s, A2C:    22.10 cm LV area s, A4C:    23.70 cm LV major d, A2C:   10.50 cm LV major d, A4C:   10.30 cm LV major s, A2C:   8.65 cm LV major s, A4C:   8.04 cm LV vol d, MOD A2C: 124.0 ml LV vol d, MOD A4C: 159.0 ml LV vol s, MOD A2C: 50.0 ml LV vol s, MOD A4C: 58.9 ml LV SV MOD A2C:     74.0 ml LV SV MOD A4C:     159.0 ml LV SV MOD BP:      85.6 ml  RIGHT VENTRICLE             IVC RV S prime:     14.10 cm/s  IVC diam: 2.30 cm TAPSE (M-mode): 2.3 cm  LEFT ATRIUM              Index       RIGHT ATRIUM           Index LA diam:        4.60 cm  2.13 cm/m  RA Area:     11.22 cm 5.20 cm/m LA Vol (A2C):   89.2 ml  41.34 ml/m LA Vol (A4C):   124.0 ml 57.48 ml/m LA Biplane Vol: 130.0 ml 60.27 ml/m  AORTIC VALVE                   PULMONIC VALVE AV Area (Vmax):    2.47 cm    PV Vmax:       0.84 m/s AV Area (Vmean):   2.69 cm    PV Peak grad:  2.8 mmHg AV Area (VTI):     2.40 cm AV Vmax:           146.00 cm/s AV Vmean:          96.500 cm/s AV VTI:            0.297 m AV Peak Grad:      8.5 mmHg AV Mean Grad:      4.0 mmHg LVOT Vmax:         115.00 cm/s LVOT Vmean:        82.500 cm/s LVOT VTI:          0.227 m LVOT/AV VTI ratio: 0.76   AORTA Ao Root diam: 3.30 cm Ao Asc diam:  2.90 cm Ao Arch diam: 2.6 cm  MITRAL VALVE                        TRICUSPID VALVE MV Area (PHT): 2.45 cm             TR Peak grad:   24.2 mmHg MV PHT:        89.90 msec           TR Vmax:        246.00 cm/s MV Decel Time: 310 msec MV E velocity: 66.00 cm/s 103 cm/s  SHUNTS MV A velocity: 83.40 cm/s 70.3 cm/s Systemic VTI:  0.23 m MV E/A ratio:  0.79       1.5       Systemic Diam: 2.00 cm  EKG:  EKG is ordered today.  The ekg ordered today demonstrates sinus bradycardia 58 bpm,  first-degree AV block, otherwise normal  Recent Labs: 12/24/2018: ALT 27 04/29/2019: BUN 16; Creatinine,  Ser 0.81; Hemoglobin 15.3; Platelets 253; Potassium 4.3; Sodium 141  Recent Lipid Panel    Component Value Date/Time   CHOL 230 (H) 12/24/2018 1241   TRIG 133 12/24/2018 1241   HDL 75 12/24/2018 1241   CHOLHDL 3.1 12/24/2018 1241   VLDL 22.6 04/10/2012 1250   LDLCALC 130 (H) 12/24/2018 1241   LDLDIRECT 130.1 04/10/2012 1250    Physical Exam:    VS:  BP (!) 162/92   Pulse (!) 58   Ht 6\' 2"  (1.88 m)   Wt 204 lb 12.8 oz (92.9 kg)   SpO2 99%   BMI 26.29 kg/m     Wt Readings from Last 3 Encounters:  04/29/19 204 lb 12.8 oz (92.9 kg)  01/22/19 201 lb 3.2 oz (91.3 kg)  01/16/18 199 lb (90.3 kg)     GEN: Well nourished, well developed in no acute distress HEENT: Normal NECK: No JVD; No carotid bruits LYMPHATICS: No lymphadenopathy CARDIAC: RRR, 3/6 holosystolic murmur at the apex RESPIRATORY:  Clear to auscultation without rales, wheezing or rhonchi  ABDOMEN: Soft, non-tender, non-distended MUSCULOSKELETAL:  No edema; No deformity  SKIN: Warm and dry NEUROLOGIC:  Alert and oriented x 3 PSYCHIATRIC:  Normal affect   ASSESSMENT:    1. Nonrheumatic mitral valve regurgitation   2. Mitral valve prolapse    PLAN:    In order of problems listed above:  1. The patient has severe, stage C, nonrheumatic mitral insufficiency secondary to mitral valve prolapse.  The patient's echocardiogram demonstrates prolapse of the posterior leaflet of the mitral valve with at least moderately severe mitral insufficiency.  We discussed the natural history of primary mitral regurgitation, specifically that of mitral valve prolapse with mitral regurgitation.  While the patient is essentially asymptomatic, he probably has anatomy that would have a high likelihood of successful surgical repair.  His left atrial dimensions have been increasing over the last 5 years as echo has demonstrated a left  atrial AP diameter of 4.1 cm and 2015 now up to 4.6 cm on his current study.  I have recommended a transesophageal echo to better assess the functional significance and severity of his mitral regurgitation.  I have also recommended a right and left heart catheterization to assess hemodynamics in preparation for possible mitral valve repair.  After the patient's studies are completed, he will undergo formal surgical consultation with Dr. Roxy Manns.  All of his questions are answered today.  I have reviewed the risks, indications, and alternatives of transesophageal echo with the patient.  He understands and agrees to proceed.  I have reviewed the risks, indications, and alternatives to cardiac catheterization, possible angioplasty, and stenting with the patient. Risks include but are not limited to bleeding, infection, vascular injury, stroke, myocardial infection, arrhythmia, kidney injury, radiation-related injury in the case of prolonged fluoroscopy use, emergency cardiac surgery, and death. The patient understands the risks of serious complication is 1-2 in [id number] with diagnostic cardiac cath and 1-2% or less with angioplasty/stenting.    Medication Adjustments/Labs and Tests Ordered: Current medicines are reviewed at length with the patient today.  Concerns regarding medicines are outlined above.  Orders Placed This Encounter  Procedures  . CT ANGIO ABDOMEN PELVIS  W &/OR WO CONTRAST  . CT ANGIO CHEST AORTA W/CM & OR WO/CM  . Basic metabolic panel  . CBC with Differential/Platelet  . Ambulatory referral to Cardiothoracic Surgery  . EKG 12-Lead   No orders of the defined types were placed in this encounter.   Patient  Instructions  COVID SCREENING INFORMATION: You are scheduled for your COVID screening on _____________ [place] [facility]) [address] Stay in the RIGHT lane and proceed under the brick awning and tell them you are there for pre-procedure  testing Do NOT bring any pets with you to the testing site   PROCEDURE INSTRUCTIONS: You will be scheduled for a TEE and heart catheterization.  1. Please arrive at the [facility] (Main Entrance A) at [facility]: [address] [place], [place] [address] at                            . You are currently allowed ONE visitor in the waiting room during procedures. Both you and your guest must wear masks upon arrival. Special note: Every effort is made to have your procedure done on time. Please understand that emergencies sometimes delay scheduled procedures.  2. Diet: Do not eat or drink anything after midnight except medications with sips of water.  3. Labs: TODAY.  4. Medication instructions in preparation for your procedure:  1) TAKE ASPIRIN 81 mg the morning of your procedures  2) You may take your other medications as directed with sips of water.  5. Plan for one night stay--bring personal belongings. 6. Bring a current list of your medications and current insurance cards. 7. You MUST have a responsible person to drive you home. 8. Someone MUST be with you the first 24 hours after you arrive home or your discharge will be delayed. 9. Please wear clothes that are easy to get on and off and wear slip-on shoes.  Thank you for allowing [place] to care for you!   -- [facility] Invasive Cardiovascular services     Signed, Sherren Mocha, MD  05/01/2019 9:50 PM    [place]

## 2019-04-29 NOTE — Patient Instructions (Signed)
COVID SCREENING INFORMATION: You are scheduled for your COVID screening on _____________ [place] [facility] ([facility]) [address] Stay in the RIGHT lane and proceed under the brick awning and tell them you are there for pre-procedure testing Do NOT bring any pets with you to the testing site   PROCEDURE INSTRUCTIONS: You will be scheduled for a TEE and heart catheterization.  1. Please arrive at the [facility] (Main Entrance A) at [facility] [address] [place], [place] [address] at                            . You are currently allowed ONE visitor in the waiting room during procedures. Both you and your guest must wear masks upon arrival. Special note: Every effort is made to have your procedure done on time. Please understand that emergencies sometimes delay scheduled procedures.  2. Diet: Do not eat or drink anything after midnight except medications with sips of water.  3. Labs: TODAY.  4. Medication instructions in preparation for your procedure:  1) TAKE ASPIRIN 81 mg the morning of your procedures  2) You may take your other medications as directed with sips of water.  5. Plan for one night stay--bring personal belongings. 6. Bring a current list of your medications and current insurance cards. 7. You MUST have a responsible person to drive you home. 8. Someone MUST be with you the first 24 hours after you arrive home or your discharge will be delayed. 9. Please wear clothes that are easy to get on and off and wear slip-on shoes.  Thank you for allowing [place] to care for you!   -- [place] In[facility] Cardi[facility] services

## 2019-04-29 NOTE — Progress Notes (Signed)
Cardiology Office Note:    Date:  05/01/2019   ID:  Ryan Carbon, [name], DOB 1955/04/18, MRN [id number]  PCP:  Crecencio Mc, [name]  Cardiologist:  No primary care provider on file.  Electrophysiologist:  None   Referring [name]: Crecencio Mc, [name]   No chief complaint on file.   History of Present Illness:    Ryan Carbon, [name] is a 64 y.o. male with a hx of mitral valve prolapse, presenting for cardiac evaluation. 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  He has no history of rheumatic fever.  He was first noted to have a heart murmur in 1991 when he underwent an exam as part of his medical training.  An echocardiogram at that time demonstrated mitral valve prolapse and mild mitral regurgitation.  The patient followed with Dr. Verl Blalock and underwent surveillance echo testing with ongoing observation recommended in the setting of his asymptomatic status and normal LV function.  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  He walks at a brisk pace with no exertional symptoms.  He rides his bike frequently for exercise at a pace of 17 mph.  He has noticed that he becomes short of breath with riding his bike up a steep hill, moreso than in the past. Otherwise he is asymptomatic at a good workload. He is able to walk 4-5 miles at a 4 mph pace.  He denies chest pain or pressure, orthopnea, PND, leg swelling, or heart palpitations.  Past Medical History:  Diagnosis Date  . ALLERGIC RHINITIS 11/25/2008  . Allergy   . Atypical nevus of scapular region 01/12/2017  . Hearing loss, right 04/11/2012   Multiple surgeries in the past, partial hearing loss  . Hematospermia 02/19/2018  . History of brain concussion 01/18/2018  . Hyperlipidemia LDL goal <130 01/10/2016  . Lichen planus [id number]   Qualifier: Diagnosis of  By: Council Mechanic [name], Hilaria Ota   . Mitral regurgitation, myxomatous 04/11/2012  . Mitral valve prolapse 04/11/2012   Severe, annual echos    Past Surgical History:  Procedure  Laterality Date  . Bilateral laparoscopic hernia repairs  1992  . COLONOSCOPY  11/17/07   Dr. Henrene Pastor  . Echo (other)     positive for MVP, markedly myxomatous, marked prolapse TVP with Regurg (03/1990)  . Echo (other)  06/21/2000   mitral valve thickened, myxomatous vs degenerative, moderate MVP, moderate MR  . Echo (other)  07/26/2006   LVF normal, EF 55-65%, Triv AR mod MR, marked posterior leaflet MVP  . Echo (other)  06/15/2009   no change (EF 55-60% Mild Myxomatous Mitral Valve Mod MVP mod MR Mild LAE)  . Nasal Septoplasty (other)  2008   Dr. Janace Hoard  . OSSICULAR RECONSTRUCTION     right ear  . TONSILLECTOMY     age 28    Current Medications: Current Meds  Medication Sig  . albuterol (PROAIR HFA) 108 (90 Base) MCG/ACT inhaler Inhale 1-2 puffs into the lungs every 6 (six) hours as needed for wheezing or shortness of breath.  . cetirizine (ZYRTEC ALLERGY) 10 MG tablet Take 10 mg by mouth every evening.   . fexofenadine (ALLEGRA) 180 MG tablet Take 180 mg by mouth daily as needed (allergies.).   Marland Kitchen loratadine (CLARITIN) 10 MG tablet Take 10 mg by mouth every evening.   . montelukast (SINGULAIR) 10 MG tablet TAKE ONE (1) TABLET BY MOUTH EVERY DAY (Patient taking differently: Take 10 mg by mouth every evening. )  . naproxen  sodium (ANAPROX) 220 MG tablet Take 220-440 mg by mouth daily as needed (pain).   . triamcinolone cream (KENALOG) 0.1 % Apply 1 application topically 2 (two) times daily. (Patient taking differently: Apply 1 application topically 2 (two) times daily as needed (skin irritation/bug bites.). )     Allergies:   Patient has no known allergies.   Social History   Socioeconomic History  . Marital status: Married    Spouse name: Manuela Schwartz  . Number of children: 2  . Years of education: Not on file  . Highest education level: Not on file  Occupational History  . 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 Smoking  status: Never Smoker  . Smokeless tobacco: Never Used  Substance and Sexual Activity  . Alcohol use: Yes    Alcohol/week: 19.0 standard drinks    Types: 19 Glasses of wine per week  . Drug use: No  . Sexual activity: Yes    Partners: Female  Other Topics Concern  . Not on file  Social History Narrative   Physician.   Married to [place]   2 Children   Daughter: Vladimir Creeks   1 son, Shanon Brow, married with child   Social Determinants of Health   Financial Resource Strain:   . Difficulty of Paying Living Expenses: Not on file  Food Insecurity:   . Worried About Charity fundraiser in the Last Year: Not on file  . Ran Out of Food in the Last Year: Not on file  Transportation Needs:   . Lack of Transportation (Medical): Not on file  . Lack of Transportation (Non-Medical): Not on file  Physical Activity:   . Days of Exercise per Week: Not on file  . Minutes of Exercise per Session: Not on file  Stress:   . Feeling of Stress : Not on file  Social Connections:   . Frequency of Communication with Friends and Family: Not on file  . Frequency of Social Gatherings with Friends and Family: Not on file  . Attends Religious Services: Not on file  . Active Member of Clubs or Organizations: Not on file  . Attends Archivist Meetings: Not on file  . Marital Status: Not on file     Family History: The patient's family history includes Breast cancer in his mother; Breast cancer (age of onset: 55) in his sister; Congestive Heart Failure in his father; Diabetes in his father; Hypertension in his mother; Hyperthyroidism in his sister; Peripheral Artery Disease (age of onset: 57) in his father; Prostate cancer in his brother. There is no history of Colon cancer, Colon polyps, Esophageal cancer, Stomach cancer, or Rectal cancer.  ROS:   Please see the history of present illness.    All other systems reviewed and are negative.  EKGs/Labs/Other Studies Reviewed:    The following studies were  reviewed today: Echo 03-17-2019: IMPRESSIONS    1. Left ventricular ejection fraction, by visual estimation, is 60 to 65%. The left ventricle has normal function. There is mildly increased left ventricular hypertrophy.  2. The left ventricle has no regional wall motion abnormalities.  3. Global right ventricle has normal systolic function.The right ventricular size is normal. No increase in right ventricular wall thickness.  4. Left atrial size was severely dilated.  5. Right atrial size was normal.  6. Severe mitral valve prolapse involving the posterior leaflet.  7. The mitral valve is myxomatous. Moderate to severe  mitral valve regurgitation. No evidence of mitral stenosis.  8. Degree of mitral regurgitation is difficult to assess due to jet eccentricity. Consider further evaluation with transesophageal echocardiogram, as clinically indicated.  9. The tricuspid valve is grossly normal. Tricuspid valve regurgitation mild-moderate. 10. The aortic valve is tricuspid. Aortic valve regurgitation is mild. No evidence of aortic valve sclerosis or stenosis. 11. The pulmonic valve was normal in structure. Pulmonic valve regurgitation is not visualized. 12. Normal pulmonary artery systolic pressure. 13. The inferior vena cava is dilated in size with >50% respiratory variability, suggesting right atrial pressure of 8 mmHg. 14. The interatrial septum was not well visualized.  FINDINGS  Left Ventricle: Left ventricular ejection fraction, by visual estimation, is 60 to 65%. The left ventricle has normal function. The left ventricle has no regional wall motion abnormalities. The left ventricular internal cavity size was the left  ventricle is normal in size. There is mildly increased left ventricular hypertrophy.  Right Ventricle: The right ventricular size is normal. No increase in right ventricular wall thickness. Global RV systolic function is has normal systolic function. The tricuspid  regurgitant velocity is 2.46 m/s, and with an assumed right atrial pressure  of 8 mmHg, the estimated right ventricular systolic pressure is normal at 32.2 mmHg.  Left Atrium: Left atrial size was severely dilated.  Right Atrium: Right atrial size was normal in size  Pericardium: There is no evidence of pericardial effusion.  Mitral Valve: The mitral valve is myxomatous. There is severe late systolic prolapse of the posterior leaflet of the mitral valve. Moderate to severe mitral valve regurgitation. No evidence of mitral valve stenosis by observation. Degree of mitral  regurgitation is difficult to assess due to jet eccentricity. Consider further evaluation with transesophageal echocardiogram, as clinically indicated.  Tricuspid Valve: The tricuspid valve is grossly normal. Tricuspid valve regurgitation mild-moderate.  Aortic Valve: The aortic valve is tricuspid. Aortic valve regurgitation is mild. The aortic valve is structurally normal, with no evidence of sclerosis or stenosis. Aortic valve mean gradient measures 4.0 mmHg. Aortic valve peak gradient measures 8.5  mmHg. Aortic valve area, by VTI measures 2.40 cm.  Pulmonic Valve: The pulmonic valve was normal in structure. Pulmonic valve regurgitation is not visualized. Pulmonic regurgitation is not visualized. No evidence of pulmonic stenosis.  Aorta: The aortic root and ascending aorta are structurally normal, with no evidence of dilitation.  Pulmonary Artery: The pulmonary artery is not well seen.  Venous: The inferior vena cava is dilated in size with greater than 50% respiratory variability, suggesting right atrial pressure of 8 mmHg.  IAS/Shunts: The interatrial septum was not well visualized.    LEFT VENTRICLE PLAX 2D LVIDd:         4.60 cm       Diastology LVIDs:         3.20 cm       LV e' lateral:   11.10 cm/s LV PW:         1.20 cm       LV E/e' lateral: 5.9 LV IVS:        1.20 cm       LV e' medial:     7.83 cm/s LVOT diam:     2.00 cm       LV E/e' medial:  8.4 LV SV:         56 ml LV SV Index:   25.87 LVOT Area:     3.14 cm   LV Volumes (MOD) LV area d,  A2C:    39.40 cm LV area d, A4C:    45.10 cm LV area s, A2C:    22.10 cm LV area s, A4C:    23.70 cm LV major d, A2C:   10.50 cm LV major d, A4C:   10.30 cm LV major s, A2C:   8.65 cm LV major s, A4C:   8.04 cm LV vol d, MOD A2C: 124.0 ml LV vol d, MOD A4C: 159.0 ml LV vol s, MOD A2C: 50.0 ml LV vol s, MOD A4C: 58.9 ml LV SV MOD A2C:     74.0 ml LV SV MOD A4C:     159.0 ml LV SV MOD BP:      85.6 ml  RIGHT VENTRICLE             IVC RV S prime:     14.10 cm/s  IVC diam: 2.30 cm TAPSE (M-mode): 2.3 cm  LEFT ATRIUM              Index       RIGHT ATRIUM           Index LA diam:        4.60 cm  2.13 cm/m  RA Area:     11.22 cm 5.20 cm/m LA Vol (A2C):   89.2 ml  41.34 ml/m LA Vol (A4C):   124.0 ml 57.48 ml/m LA Biplane Vol: 130.0 ml 60.27 ml/m  AORTIC VALVE                   PULMONIC VALVE AV Area (Vmax):    2.47 cm    PV Vmax:       0.84 m/s AV Area (Vmean):   2.69 cm    PV Peak grad:  2.8 mmHg AV Area (VTI):     2.40 cm AV Vmax:           146.00 cm/s AV Vmean:          96.500 cm/s AV VTI:            0.297 m AV Peak Grad:      8.5 mmHg AV Mean Grad:      4.0 mmHg LVOT Vmax:         115.00 cm/s LVOT Vmean:        82.500 cm/s LVOT VTI:          0.227 m LVOT/AV VTI ratio: 0.76   AORTA Ao Root diam: 3.30 cm Ao Asc diam:  2.90 cm Ao Arch diam: 2.6 cm  MITRAL VALVE                        TRICUSPID VALVE MV Area (PHT): 2.45 cm             TR Peak grad:   24.2 mmHg MV PHT:        89.90 msec           TR Vmax:        246.00 cm/s MV Decel Time: 310 msec MV E velocity: 66.00 cm/s 103 cm/s  SHUNTS MV A velocity: 83.40 cm/s 70.3 cm/s Systemic VTI:  0.23 m MV E/A ratio:  0.79       1.5       Systemic Diam: 2.00 cm  EKG:  EKG is ordered today.  The ekg ordered today demonstrates sinus bradycardia 58 bpm,  first-degree AV block, otherwise normal  Recent Labs: 12/24/2018: ALT 27 04/29/2019: BUN 16; Creatinine,  Ser 0.81; Hemoglobin 15.3; Platelets 253; Potassium 4.3; Sodium 141  Recent Lipid Panel    Component Value Date/Time   CHOL 230 (H) 12/24/2018 1241   TRIG 133 12/24/2018 1241   HDL 75 12/24/2018 1241   CHOLHDL 3.1 12/24/2018 1241   VLDL 22.6 04/10/2012 1250   LDLCALC 130 (H) 12/24/2018 1241   LDLDIRECT 130.1 04/10/2012 1250    Physical Exam:    VS:  BP (!) 162/92   Pulse (!) 58   Ht 6\' 2"  (1.88 m)   Wt 204 lb 12.8 oz (92.9 kg)   SpO2 99%   BMI 26.29 kg/m     Wt Readings from Last 3 Encounters:  04/29/19 204 lb 12.8 oz (92.9 kg)  01/22/19 201 lb 3.2 oz (91.3 kg)  01/16/18 199 lb (90.3 kg)     GEN: Well nourished, well developed in no acute distress HEENT: Normal NECK: No JVD; No carotid bruits LYMPHATICS: No lymphadenopathy CARDIAC: RRR, 3/6 holosystolic murmur at the apex RESPIRATORY:  Clear to auscultation without rales, wheezing or rhonchi  ABDOMEN: Soft, non-tender, non-distended MUSCULOSKELETAL:  No edema; No deformity  SKIN: Warm and dry NEUROLOGIC:  Alert and oriented x 3 PSYCHIATRIC:  Normal affect   ASSESSMENT:    1. Nonrheumatic mitral valve regurgitation   2. Mitral valve prolapse    PLAN:    In order of problems listed above:  1. The patient has severe, stage C, nonrheumatic mitral insufficiency secondary to mitral valve prolapse.  The patient's echocardiogram demonstrates prolapse of the posterior leaflet of the mitral valve with at least moderately severe mitral insufficiency.  We discussed the natural history of primary mitral regurgitation, specifically that of mitral valve prolapse with mitral regurgitation.  While the patient is essentially asymptomatic, he probably has anatomy that would have a high likelihood of successful surgical repair.  His left atrial dimensions have been increasing over the last 5 years as echo has demonstrated a left  atrial AP diameter of 4.1 cm and 2015 now up to 4.6 cm on his current study.  I have recommended a transesophageal echo to better assess the functional significance and severity of his mitral regurgitation.  I have also recommended a right and left heart catheterization to assess hemodynamics in preparation for possible mitral valve repair.  After the patient's studies are completed, he will undergo formal surgical consultation with Dr. Roxy Manns.  All of his questions are answered today.  I have reviewed the risks, indications, and alternatives of transesophageal echo with the patient.  He understands and agrees to proceed.  I have reviewed the risks, indications, and alternatives to cardiac catheterization, possible angioplasty, and stenting with the patient. Risks include but are not limited to bleeding, infection, vascular injury, stroke, myocardial infection, arrhythmia, kidney injury, radiation-related injury in the case of prolonged fluoroscopy use, emergency cardiac surgery, and death. The patient understands the risks of serious complication is 1-2 in [id number] with diagnostic cardiac cath and 1-2% or less with angioplasty/stenting.    Medication Adjustments/Labs and Tests Ordered: Current medicines are reviewed at length with the patient today.  Concerns regarding medicines are outlined above.  Orders Placed This Encounter  Procedures  . CT ANGIO ABDOMEN PELVIS  W &/OR WO CONTRAST  . CT ANGIO CHEST AORTA W/CM & OR WO/CM  . Basic metabolic panel  . CBC with Differential/Platelet  . Ambulatory referral to Cardiothoracic Surgery  . EKG 12-Lead   No orders of the defined types were placed in this encounter.   Patient  Instructions  COVID SCREENING INFORMATION: You are scheduled for your COVID screening on _____________ [place]) [address] Stay in the RIGHT lane and proceed under the brick awning and tell them you are there for pre-procedure  testing Do NOT bring any pets with you to the testing site   PROCEDURE INSTRUCTIONS: You will be scheduled for a TEE and heart catheterization.  1. Please arrive at the [facility] (Main Entrance A) at [facility]: [address] [place], [place] [address] at                            . You are currently allowed ONE visitor in the waiting room during procedures. Both you and your guest must wear masks upon arrival. Special note: Every effort is made to have your procedure done on time. Please understand that emergencies sometimes delay scheduled procedures.  2. Diet: Do not eat or drink anything after midnight except medications with sips of water.  3. Labs: TODAY.  4. Medication instructions in preparation for your procedure:  1) TAKE ASPIRIN 81 mg the morning of your procedures  2) You may take your other medications as directed with sips of water.  5. Plan for one night stay--bring personal belongings. 6. Bring a current list of your medications and current insurance cards. 7. You MUST have a responsible person to drive you home. 8. Someone MUST be with you the first 24 hours after you arrive home or your discharge will be delayed. 9. Please wear clothes that are easy to get on and off and wear slip-on shoes.  Thank you for allowing [place] to care for you!   -- [facility] Invasive Cardiovascular services     Signed, Sherren Mocha, [name]  05/01/2019 9:50 PM    [place]

## 2019-04-30 ENCOUNTER — Telehealth: Payer: Self-pay

## 2019-04-30 DIAGNOSIS — D2261 Melanocytic nevi of right upper limb, including shoulder: Secondary | ICD-10-CM | POA: Diagnosis not present

## 2019-04-30 DIAGNOSIS — D2262 Melanocytic nevi of left upper limb, including shoulder: Secondary | ICD-10-CM | POA: Diagnosis not present

## 2019-04-30 DIAGNOSIS — D2272 Melanocytic nevi of left lower limb, including hip: Secondary | ICD-10-CM | POA: Diagnosis not present

## 2019-04-30 DIAGNOSIS — D225 Melanocytic nevi of trunk: Secondary | ICD-10-CM | POA: Diagnosis not present

## 2019-04-30 DIAGNOSIS — D2271 Melanocytic nevi of right lower limb, including hip: Secondary | ICD-10-CM | POA: Diagnosis not present

## 2019-04-30 LAB — BASIC METABOLIC PANEL
BUN/Creatinine Ratio: 20 (ref 10–24)
BUN: 16 mg/dL (ref 8–27)
CO2: 22 mmol/L (ref 20–29)
Calcium: 9.7 mg/dL (ref 8.6–10.2)
Chloride: 104 mmol/L (ref 96–106)
Creatinine, Ser: 0.81 mg/dL (ref 0.76–1.27)
GFR calc Af Amer: 110 mL/min/{1.73_m2} (ref >59)
GFR calc non Af Amer: 95 mL/min/{1.73_m2} (ref >59)
Glucose: 72 mg/dL (ref 65–99)
Potassium: 4.3 mmol/L (ref 3.5–5.2)
Sodium: 141 mmol/L (ref 134–144)

## 2019-04-30 LAB — CBC WITH DIFFERENTIAL/PLATELET
Basophils Absolute: 0 10*3/uL (ref 0.0–0.2)
Basos: 0 %
EOS (ABSOLUTE): 0.2 10*3/uL (ref 0.0–0.4)
Eos: 2 %
Hematocrit: 43.6 % (ref 37.5–51.0)
Hemoglobin: 15.3 g/dL (ref 13.0–17.7)
Immature Grans (Abs): 0 10*3/uL (ref 0.0–0.1)
Immature Granulocytes: 0 %
Lymphocytes Absolute: 3.1 10*3/uL (ref 0.7–3.1)
Lymphs: 44 %
MCH: 32.1 pg (ref 26.6–33.0)
MCHC: 35.1 g/dL (ref 31.5–35.7)
MCV: 92 fL (ref 79–97)
Monocytes Absolute: 0.9 10*3/uL (ref 0.1–0.9)
Monocytes: 13 %
Neutrophils Absolute: 2.9 10*3/uL (ref 1.4–7.0)
Neutrophils: 41 %
Platelets: 253 10*3/uL (ref 150–450)
RBC: 4.76 x10E6/uL (ref 4.14–5.80)
RDW: 12.4 % (ref 11.6–15.4)
WBC: 7.1 10*3/uL (ref 3.4–10.8)

## 2019-04-30 NOTE — Telephone Encounter (Signed)
Confirmed TEE and L/RHC on 2/3. Confirmed CTA chest/ABD/pelvis 2/10 at 1210 at [facility]. The patient understands to be NPO 4 hours prior to scans and to arrive 15 minutes early. Confirmed OV with Dr. Roxy Manns at [facility] on 2/10 at 3:00PM. He understands to arrive at 2:45PM. He was grateful for assistance.

## 2019-05-05 ENCOUNTER — Telehealth: Payer: Self-pay | Admitting: *Deleted

## 2019-05-05 ENCOUNTER — Other Ambulatory Visit (HOSPITAL_COMMUNITY)
Admission: RE | Admit: 2019-05-05 | Discharge: 2019-05-05 | Disposition: A | Discharge status: Home / Self Care | Payer: 59 | Source: Ambulatory Visit | Attending: Cardiovascular Disease | Admitting: Cardiovascular Disease

## 2019-05-05 DIAGNOSIS — Z01812 Encounter for preprocedural laboratory examination: Secondary | ICD-10-CM | POA: Diagnosis not present

## 2019-05-05 DIAGNOSIS — Z20822 Contact with and (suspected) exposure to covid-19: Secondary | ICD-10-CM | POA: Insufficient documentation

## 2019-05-05 LAB — SARS CORONAVIRUS 2 (TAT 6-24 HRS): SARS Coronavirus 2: NEGATIVE

## 2019-05-05 NOTE — Telephone Encounter (Signed)
Pt contacted pre-catheterization scheduled at [facility] for: Wednesday May 06, 2019 1PM/TEE 11 AM Verified arrival time and place: [facility]) at: 10 AM   Nothing to eat or drink after midnight except sips of water to take medications. Contrast allergy: no   AM meds can be  taken pre-cath with sip of water including: ASA 81 mg   Confirmed patient has responsible adult to drive home post procedure and observe 24 hours after arriving home: yes  Currently, due to Covid-19 pandemic, only one person will be allowed with patient. Must be the same person for patient's entire stay and will be required to wear a mask. They will be asked to wait in the waiting room for the duration of the patient's stay.  Patients are required to wear a mask when they enter the hospital.      COVID-19 Pre-Screening Questions:  . In the past 7 to 10 days have you had a cough,  shortness of breath, headache, congestion, fever (100 or greater) body aches, chills, sore throat, or sudden loss of taste or sense of smell? no . Have you been around anyone with known Covid 19? Pt states not recently- not in past 7-10 days . Have you been around anyone who is awaiting Covid 19 test results in the past 7 to 10 days? no . Have you been around anyone who has been exposed to Covid 19, or has mentioned symptoms of Covid 19 within the past 7 to 10 days? no  I reviewed procedure/mask/visitor instructions, Covid-19 screening questions with patient, he verbalized understanding, thanked me for call.

## 2019-05-06 ENCOUNTER — Encounter (HOSPITAL_COMMUNITY): Payer: Self-pay | Admitting: Cardiology

## 2019-05-06 ENCOUNTER — Ambulatory Visit (HOSPITAL_COMMUNITY): Payer: 59 | Admitting: Certified Registered Nurse Anesthetist

## 2019-05-06 ENCOUNTER — Encounter (HOSPITAL_COMMUNITY)
Admission: RE | Disposition: A | Discharge status: Home / Self Care | Payer: Self-pay | Source: Home / Self Care | Attending: Cardiology

## 2019-05-06 ENCOUNTER — Institutional Professional Consult (permissible substitution): Payer: 59 | Admitting: Thoracic Surgery (Cardiothoracic Vascular Surgery)

## 2019-05-06 ENCOUNTER — Ambulatory Visit (HOSPITAL_BASED_OUTPATIENT_CLINIC_OR_DEPARTMENT_OTHER)
Admission: RE | Admit: 2019-05-06 | Discharge: 2019-05-06 | Disposition: A | Discharge status: Home / Self Care | Payer: 59 | Source: Ambulatory Visit | Attending: Cardiovascular Disease | Admitting: Cardiovascular Disease

## 2019-05-06 ENCOUNTER — Other Ambulatory Visit: Payer: Self-pay

## 2019-05-06 ENCOUNTER — Encounter: Payer: Self-pay | Admitting: Thoracic Surgery (Cardiothoracic Vascular Surgery)

## 2019-05-06 ENCOUNTER — Ambulatory Visit (HOSPITAL_COMMUNITY)
Admission: RE | Admit: 2019-05-06 | Discharge: 2019-05-06 | Disposition: A | Discharge status: Home / Self Care | Payer: 59 | Attending: Cardiology | Admitting: Cardiology

## 2019-05-06 VITALS — BP 169/90 | HR 69 | Temp 97.7°F | Resp 20 | Ht 74.0 in | Wt 204.0 lb

## 2019-05-06 DIAGNOSIS — R361 Hematospermia: Secondary | ICD-10-CM | POA: Diagnosis not present

## 2019-05-06 DIAGNOSIS — Z79899 Other long term (current) drug therapy: Secondary | ICD-10-CM | POA: Insufficient documentation

## 2019-05-06 DIAGNOSIS — E785 Hyperlipidemia, unspecified: Secondary | ICD-10-CM | POA: Diagnosis not present

## 2019-05-06 DIAGNOSIS — I340 Nonrheumatic mitral (valve) insufficiency: Secondary | ICD-10-CM

## 2019-05-06 DIAGNOSIS — I083 Combined rheumatic disorders of mitral, aortic and tricuspid valves: Secondary | ICD-10-CM | POA: Diagnosis not present

## 2019-05-06 DIAGNOSIS — Z8249 Family history of ischemic heart disease and other diseases of the circulatory system: Secondary | ICD-10-CM | POA: Diagnosis not present

## 2019-05-06 DIAGNOSIS — I341 Nonrheumatic mitral (valve) prolapse: Secondary | ICD-10-CM

## 2019-05-06 DIAGNOSIS — I2510 Atherosclerotic heart disease of native coronary artery without angina pectoris: Secondary | ICD-10-CM | POA: Diagnosis not present

## 2019-05-06 DIAGNOSIS — J309 Allergic rhinitis, unspecified: Secondary | ICD-10-CM | POA: Diagnosis not present

## 2019-05-06 HISTORY — PX: RIGHT/LEFT HEART CATH AND CORONARY ANGIOGRAPHY: CATH118266

## 2019-05-06 HISTORY — PX: TEE WITHOUT CARDIOVERSION: SHX5443

## 2019-05-06 LAB — POCT I-STAT 7, (LYTES, BLD GAS, ICA,H+H)
Acid-base deficit: 1 mmol/L (ref 0.0–2.0)
Bicarbonate: 23.5 mmol/L (ref 20.0–28.0)
Calcium, Ion: 1.29 mmol/L (ref 1.15–1.40)
HCT: 41 % (ref 39.0–52.0)
Hemoglobin: 13.9 g/dL (ref 13.0–17.0)
O2 Saturation: 99 %
Potassium: 4.4 mmol/L (ref 3.5–5.1)
Sodium: 139 mmol/L (ref 135–145)
TCO2: 25 mmol/L (ref 22–32)
pCO2 arterial: 39 mmHg (ref 32.0–48.0)
pH, Arterial: 7.389 (ref 7.350–7.450)
pO2, Arterial: 153 mmHg — ABNORMAL HIGH (ref 83.0–108.0)

## 2019-05-06 LAB — POCT I-STAT EG7
Acid-base deficit: 4 mmol/L — ABNORMAL HIGH (ref 0.0–2.0)
Bicarbonate: 21.9 mmol/L (ref 20.0–28.0)
Calcium, Ion: 1.09 mmol/L — ABNORMAL LOW (ref 1.15–1.40)
HCT: 37 % — ABNORMAL LOW (ref 39.0–52.0)
Hemoglobin: 12.6 g/dL — ABNORMAL LOW (ref 13.0–17.0)
O2 Saturation: 73 %
Potassium: 3.6 mmol/L (ref 3.5–5.1)
Sodium: 139 mmol/L (ref 135–145)
TCO2: 23 mmol/L (ref 22–32)
pCO2, Ven: 43.4 mmHg — ABNORMAL LOW (ref 44.0–60.0)
pH, Ven: 7.31 (ref 7.250–7.430)
pO2, Ven: 42 mmHg (ref 32.0–45.0)

## 2019-05-06 SURGERY — ECHOCARDIOGRAM, TRANSESOPHAGEAL
Anesthesia: General

## 2019-05-06 SURGERY — RIGHT/LEFT HEART CATH AND CORONARY ANGIOGRAPHY
Anesthesia: LOCAL

## 2019-05-06 MED ORDER — VERAPAMIL HCL 2.5 MG/ML IV SOLN
INTRAVENOUS | Status: AC
  Filled 2019-05-06: qty 2

## 2019-05-06 MED ORDER — SODIUM CHLORIDE 0.9 % IV SOLN: 250.0000 mL | INTRAVENOUS | Status: DC | PRN

## 2019-05-06 MED ORDER — ACETAMINOPHEN 325 MG PO TABS: 650.0000 mg | ORAL_TABLET | ORAL | Status: DC | PRN

## 2019-05-06 MED ORDER — MIDAZOLAM HCL 2 MG/2ML IJ SOLN
INTRAMUSCULAR | Status: AC
  Filled 2019-05-06: qty 2

## 2019-05-06 MED ORDER — LIDOCAINE HCL (PF) 1 % IJ SOLN
INTRAMUSCULAR | Status: DC | PRN
  Administered 2019-05-06 (×2): 2 mL

## 2019-05-06 MED ORDER — FENTANYL CITRATE (PF) 100 MCG/2ML IJ SOLN
INTRAMUSCULAR | Status: DC | PRN
  Administered 2019-05-06: 25 ug via INTRAVENOUS

## 2019-05-06 MED ORDER — SODIUM CHLORIDE 0.9% FLUSH: 3.0000 mL | INTRAVENOUS | Status: DC | PRN

## 2019-05-06 MED ORDER — HEPARIN SODIUM (PORCINE) 1000 UNIT/ML IJ SOLN
INTRAMUSCULAR | Status: AC
  Filled 2019-05-06: qty 1

## 2019-05-06 MED ORDER — LABETALOL HCL 5 MG/ML IV SOLN: 10.0000 mg | INTRAVENOUS | Status: AC | PRN

## 2019-05-06 MED ORDER — IOHEXOL 350 MG/ML SOLN
INTRAVENOUS | Status: DC | PRN
  Administered 2019-05-06: 13:00:00 50 mL via INTRA_ARTERIAL

## 2019-05-06 MED ORDER — SODIUM CHLORIDE 0.9 % WEIGHT BASED INFUSION
3.0000 mL/kg/h | INTRAVENOUS | Status: AC
  Administered 2019-05-06: 450 mL via INTRAVENOUS
  Administered 2019-05-06: 150 mL via INTRAVENOUS
  Administered 2019-05-06: 3 mL/kg/h via INTRAVENOUS

## 2019-05-06 MED ORDER — HEPARIN (PORCINE) IN NACL 1000-0.9 UT/500ML-% IV SOLN
INTRAVENOUS | Status: AC
  Filled 2019-05-06: qty 500

## 2019-05-06 MED ORDER — HEPARIN (PORCINE) IN NACL 1000-0.9 UT/500ML-% IV SOLN
INTRAVENOUS | Status: DC | PRN
  Administered 2019-05-06: 500 mL

## 2019-05-06 MED ORDER — LIDOCAINE HCL (PF) 1 % IJ SOLN
INTRAMUSCULAR | Status: AC
  Filled 2019-05-06: qty 30

## 2019-05-06 MED ORDER — SODIUM CHLORIDE 0.9 % IV SOLN: INTRAVENOUS | Status: DC

## 2019-05-06 MED ORDER — VERAPAMIL HCL 2.5 MG/ML IV SOLN
INTRAVENOUS | Status: DC | PRN
  Administered 2019-05-06: 13:00:00 8 mL via INTRA_ARTERIAL

## 2019-05-06 MED ORDER — LIDOCAINE 2% (20 MG/ML) 5 ML SYRINGE
INTRAMUSCULAR | Status: DC | PRN
  Administered 2019-05-06: 60 mg via INTRAVENOUS

## 2019-05-06 MED ORDER — HYDRALAZINE HCL 20 MG/ML IJ SOLN: 10.0000 mg | INTRAMUSCULAR | Status: AC | PRN

## 2019-05-06 MED ORDER — PROPOFOL 10 MG/ML IV BOLUS
INTRAVENOUS | Status: DC | PRN
  Administered 2019-05-06 (×3): 10 mg via INTRAVENOUS
  Administered 2019-05-06: 20 mg via INTRAVENOUS
  Administered 2019-05-06: 30 mg via INTRAVENOUS

## 2019-05-06 MED ORDER — ASPIRIN 81 MG PO CHEW: 81.0000 mg | CHEWABLE_TABLET | ORAL | Status: DC

## 2019-05-06 MED ORDER — FENTANYL CITRATE (PF) 100 MCG/2ML IJ SOLN
INTRAMUSCULAR | Status: AC
  Filled 2019-05-06: qty 2

## 2019-05-06 MED ORDER — PROPOFOL 500 MG/50ML IV EMUL
INTRAVENOUS | Status: DC | PRN
  Administered 2019-05-06: 100 ug/kg/min via INTRAVENOUS

## 2019-05-06 MED ORDER — HEPARIN SODIUM (PORCINE) 1000 UNIT/ML IJ SOLN
INTRAMUSCULAR | Status: DC | PRN
  Administered 2019-05-06: 4000 [IU] via INTRAVENOUS

## 2019-05-06 MED ORDER — SODIUM CHLORIDE 0.9% FLUSH: 3.0000 mL | Freq: Two times a day (BID) | INTRAVENOUS | Status: DC

## 2019-05-06 MED ORDER — HEPARIN (PORCINE) IN NACL 1000-0.9 UT/500ML-% IV SOLN
INTRAVENOUS | Status: AC
  Filled 2019-05-06: qty 1000

## 2019-05-06 MED ORDER — SODIUM CHLORIDE 0.9 % WEIGHT BASED INFUSION: 1.0000 mL/kg/h | INTRAVENOUS | Status: DC

## 2019-05-06 MED ORDER — SODIUM CHLORIDE 0.9 % WEIGHT BASED INFUSION: 1.0000 mL/kg/h | INTRAVENOUS | Status: AC

## 2019-05-06 MED ORDER — ONDANSETRON HCL 4 MG/2ML IJ SOLN: 4.0000 mg | Freq: Four times a day (QID) | INTRAMUSCULAR | Status: DC | PRN

## 2019-05-06 MED ORDER — MIDAZOLAM HCL 2 MG/2ML IJ SOLN
INTRAMUSCULAR | Status: DC | PRN
  Administered 2019-05-06: 1 mg via INTRAVENOUS

## 2019-05-06 SURGICAL SUPPLY — 13 items
CATH 5FR JL3.5 JR4 ANG PIG MP (CATHETERS) ×1 IMPLANT
CATH BALLN WEDGE 5F 110CM (CATHETERS) ×1 IMPLANT
DEVICE RAD COMP TR BAND LRG (VASCULAR PRODUCTS) ×1 IMPLANT
GLIDESHEATH SLEND SS 6F .021 (SHEATH) ×1 IMPLANT
GUIDEWIRE .025 260CM (WIRE) ×1 IMPLANT
GUIDEWIRE INQWIRE 1.5J.035X260 (WIRE) IMPLANT
INQWIRE 1.5J .035X260CM (WIRE) ×2
KIT HEART LEFT (KITS) ×2 IMPLANT
PACK CARDIAC CATHETERIZATION (CUSTOM PROCEDURE TRAY) ×2 IMPLANT
SHEATH GLIDE SLENDER 4/5FR (SHEATH) ×1 IMPLANT
TRANSDUCER W/STOPCOCK (MISCELLANEOUS) ×2 IMPLANT
TUBING CIL FLEX 10 FLL-RA (TUBING) ×2 IMPLANT
WIRE MICROINTRODUCER 60CM (WIRE) ×2 IMPLANT

## 2019-05-06 NOTE — H&P (View-Only) (Signed)
[address]       [place], [address]             [place] REPORT  Referring Provider is Sherren Mocha, MD PCP is Crecencio Mc, MD  Chief Complaint  Patient presents with  . 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  Patient states that he has known of a presence of a heart murmur since 1991 when it was first noted on routine physical exam.  Echocardiogram performed at that time revealed mitral valve prolapse with mild mitral regurgitation.  He has had regular follow-up echocardiograms performed intermittently ever since, and recent follow-up echocardiograms have documented progressive enlargement of the left atrium but normal left ventricular size and systolic function.  The patient was seen in consultation recently by Dr. Burt Knack and subsequently scheduled for transesophageal echocardiogram and diagnostic cardiac catheterization which was performed earlier today.  TEE confirmed the presence of mitral valve prolapse with severe prolapse involving a portion of the posterior leaflet with ruptured primary chordae tendinae.  Function remain normal.  Diagnostic cardiac catheterization was notable for the absence of significant coronary artery disease and revealed normal right heart pressures.  Cardiothoracic surgical consultation was requested.  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  He exercises on a regular basis including bicycling on a road bike.  He states that over the past year or 2 his average speed on a bicycle has dropped a slight amount and he has started getting short of breath going up steep hills.  Otherwise he has absolutely no symptoms whatsoever.  He  denies any symptoms of shortness of breath with ordinary activity or at rest.  He denies any PND, orthopnea, or lower extremity edema.  He has never had any palpitations, dizzy spells, or atypical chest pain.  Past Medical History:  Diagnosis Date  . ALLERGIC RHINITIS 11/25/2008  . Allergy   . Atypical nevus of scapular region 01/12/2017  . Hearing loss, right 04/11/2012   Multiple surgeries in the past, partial hearing loss  . Hematospermia 02/19/2018  . History of brain concussion 01/18/2018  . Hyperlipidemia LDL goal <130 01/10/2016  . Lichen planus [id number]   Qualifier: Diagnosis of  By: Council Mechanic MD, Hilaria Ota   . Mitral regurgitation, myxomatous 04/11/2012  . Mitral valve prolapse 04/11/2012   Severe, annual echos    Past Surgical History:  Procedure Laterality Date  . Bilateral laparoscopic hernia repairs  1992  . COLONOSCOPY  11/17/07   Dr. Henrene Pastor  . Echo (other)     positive for MVP, markedly myxomatous, marked prolapse TVP with Regurg (03/1990)  . Echo (other)  06/21/2000   mitral valve thickened, myxomatous vs degenerative, moderate MVP, moderate MR  . Echo (other)  07/26/2006   LVF normal, EF 55-65%, Triv AR mod MR, marked posterior leaflet MVP  . Echo (other)  06/15/2009   no change (EF 55-60% Mild Myxomatous Mitral Valve Mod MVP mod MR Mild LAE)  . Nasal Septoplasty (other)  2008   Dr. Janace Hoard  . OSSICULAR RECONSTRUCTION     right ear  . TONSILLECTOMY     age 70    Family History  Problem Relation Age of Onset  .  Breast cancer Mother   . Hypertension Mother   . Diabetes Father        diet controlled, bypass fraft polyps vascular Dz.  . Congestive Heart Failure Father        died in 47 chf   . Peripheral Artery Disease Father 44  . Hyperthyroidism Sister   . Breast cancer Sister 37       died of tumor lysis syndrome   . Prostate cancer Brother        avoiding surgery  . Colon cancer Neg Hx   . Colon polyps Neg Hx   . Esophageal cancer Neg Hx   . Stomach  cancer Neg Hx   . Rectal cancer Neg Hx     Social History   Socioeconomic History  . Marital status: Married    Spouse name: Manuela Schwartz  . Number of children: 2  . Years of education: Not on file  . Highest education level: Not on file  Occupational History  . 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 Smoking status: Never Smoker  . Smokeless tobacco: Never Used  Substance and Sexual Activity  . Alcohol use: Yes    Alcohol/week: 19.0 standard drinks    Types: 19 Glasses of wine per week  . Drug use: No  . Sexual activity: Yes    Partners: Female  Other Topics Concern  . Not on file  Social History Narrative   Physician.   Married to [place]   2 Children   Daughter: Vladimir Creeks   1 son, Shanon Brow, married with child   Social Determinants of Health   Financial Resource Strain:   . Difficulty of Paying Living Expenses: Not on file  Food Insecurity:   . Worried About Charity fundraiser in the Last Year: Not on file  . Ran Out of Food in the Last Year: Not on file  Transportation Needs:   . Lack of Transportation (Medical): Not on file  . Lack of Transportation (Non-Medical): Not on file  Physical Activity:   . Days of Exercise per Week: Not on file  . Minutes of Exercise per Session: Not on file  Stress:   . Feeling of Stress : Not on file  Social Connections:   . Frequency of Communication with Friends and Family: Not on file  . Frequency of Social Gatherings with Friends and Family: Not on file  . Attends Religious Services: Not on file  . Active Member of Clubs or Organizations: Not on file  . Attends Archivist Meetings: Not on file  . Marital Status: Not on file  Intimate Partner Violence:   . Fear of Current or Ex-Partner: Not on file  . Emotionally Abused: Not on file  . Physically Abused: Not on file  . Sexually Abused: Not on file    No current facility-administered medications for this visit.    No current outpatient medications on file.   Facility-Administered Medications Ordered in Other Visits  Medication Dose Route Frequency Provider Last Rate Last Admin  . 0.9 %  sodium chloride infusion  250 mL Intravenous PRN Sherren Mocha, MD      . 0.9 %  sodium chloride infusion  250 mL Intravenous PRN Sherren Mocha, MD      . 0.9% sodium chloride infusion  1 mL/kg/hr Intravenous Continuous Sherren Mocha, MD      . 0.9% sodium chloride infusion  1 mL/kg/hr Intravenous  Continuous Sherren Mocha, MD      . acetaminophen (TYLENOL) tablet 650 mg  650 mg Oral Q4H PRN Sherren Mocha, MD      . aspirin chewable tablet 81 mg  81 mg Oral Brock Ra, Legrand Como, MD      . hydrALAZINE (APRESOLINE) injection 10 mg  10 mg Intravenous Q20 Min PRN Sherren Mocha, MD      . labetalol (NORMODYNE) injection 10 mg  10 mg Intravenous Q10 min PRN Sherren Mocha, MD      . ondansetron [facility]) injection 4 mg  4 mg Intravenous Q6H PRN Sherren Mocha, MD      . sodium chloride flush (NS) 0.9 % injection 3 mL  3 mL Intravenous PRN Sherren Mocha, MD      . sodium chloride flush (NS) 0.9 % injection 3 mL  3 mL Intravenous Q12H Sherren Mocha, MD      . sodium chloride flush (NS) 0.9 % injection 3 mL  3 mL Intravenous PRN Sherren Mocha, MD        No Known Allergies    Review of Systems:   General:  normal appetite, normal energy, no weight gain, no weight loss, no fever  Cardiac:  no chest pain with exertion, no chest pain at rest, no SOB with exertion, no resting SOB, no PND, no orthopnea, no palpitations, no arrhythmia, no atrial fibrillation, no LE edema, no dizzy spells, no syncope  Respiratory:  no shortness of breath, no home oxygen, no productive cough, no dry cough, no bronchitis, no wheezing, no hemoptysis, cold-induced asthma, no pain with inspiration or cough, no sleep apnea, no CPAP at night  GI:   no difficulty swallowing, no reflux, no frequent heartburn, no hiatal hernia, no  abdominal pain, no constipation, no diarrhea, no hematochezia, no hematemesis, no melena  GU:   no dysuria,  no frequency, no urinary tract infection, no hematuria, no enlarged prostate, no kidney stones, no kidney disease  Vascular:  no pain suggestive of claudication, no pain in feet, no leg cramps, no varicose veins, no DVT, no non-healing foot ulcer  Neuro:   no stroke, no TIA's, no seizures, no headaches, no temporary blindness one eye,  no slurred speech, no peripheral neuropathy, no chronic pain, no instability of gait, no memory/cognitive dysfunction  Musculoskeletal: no arthritis, no joint swelling, no myalgias, no difficulty walking, normal mobility   Skin:   no rash, no itching, no skin infections, no pressure sores or ulcerations  Psych:   no anxiety, no depression, no nervousness, no unusual recent stress  Eyes:   no blurry vision, no floaters, no recent vision changes, + wears glasses or contacts  ENT:   + hearing loss, no loose or painful teeth, no dentures, last saw dentist Sept 2020  Hematologic:  no easy bruising, no abnormal bleeding, no clotting disorder, no frequent epistaxis  Endocrine:  no diabetes, does not check CBG's at home     Physical Exam:   BP (!) 169/90   Pulse 69   Temp 97.7 F (36.5 C) (Skin)   Resp 20   Ht 6\' 2"  (1.88 m)   Wt 204 lb (92.5 kg)   SpO2 99% Comment: RA  BMI 26.19 kg/m   General:    well-appearing  HEENT:  Unremarkable   Neck:   no JVD, no bruits, no adenopathy   Chest:   clear to auscultation, symmetrical breath sounds, no wheezes, no rhonchi   CV:   RRR, grade IV/VI holosystolic murmur  Abdomen:  soft, non-tender, no masses   Extremities:  warm, well-perfused, pulses palpable, no LE edema  Rectal/GU  Deferred  Neuro:   Grossly non-focal and symmetrical throughout  Skin:   Clean and dry, no rashes, no breakdown   Diagnostic Tests:  ECHOCARDIOGRAM REPORT       Patient Name:  TEODOR ANZURES Date of Exam: 03/17/2019    Medical Rec #: [id number]    Height:    73.0 in  Accession #:  [id number]    Weight:    201.2 lb  Date of Birth: February 07, 1956    BSA:     2.16 m  Patient Age:  75 years     BP:      132/74 mmHg  Patient Gender: M        HR:      70 bpm.  Exam Location: [facility]   Procedure: 2D Echo, Cardiac Doppler and Color Doppler   Indications:  I34.1 Nonrheumatic mitral (valve) prolapse    History:    Patient has prior history of Echocardiogram examinations,  most         recent 01/17/2017. Mitral Valve Disease,  Signs/Symptoms:Murmur;         Risk Factors:Dyslipidemia and Non-Smoker.    Sonographer:  Pilar Jarvis RDMS, RVT, RDCS  Referring Phys: [place]    1. Left ventricular ejection fraction, by visual estimation, is 60 to  65%. The left ventricle has normal function. There is mildly increased  left ventricular hypertrophy.  2. The left ventricle has no regional wall motion abnormalities.  3. Global right ventricle has normal systolic function.The right  ventricular size is normal. No increase in right ventricular wall  thickness.  4. Left atrial size was severely dilated.  5. Right atrial size was normal.  6. Severe mitral valve prolapse involving the posterior leaflet.  7. The mitral valve is myxomatous. Moderate to severe mitral valve  regurgitation. No evidence of mitral stenosis.  8. Degree of mitral regurgitation is difficult to assess due to jet  eccentricity. Consider further evaluation with transesophageal  echocardiogram, as clinically indicated.  9. The tricuspid valve is grossly normal. Tricuspid valve regurgitation  mild-moderate.  10. The aortic valve is tricuspid. Aortic valve regurgitation is mild. No  evidence of aortic valve sclerosis or stenosis.  11. The pulmonic valve was normal in structure. Pulmonic valve  regurgitation is not visualized.  12.  Normal pulmonary artery systolic pressure.  13. The inferior vena cava is dilated in size with >50% respiratory  variability, suggesting right atrial pressure of 8 mmHg.  14. The interatrial septum was not well visualized.   FINDINGS  Left Ventricle: Left ventricular ejection fraction, by visual estimation,  is 60 to 65%. The left ventricle has normal function. The left ventricle  has no regional wall motion abnormalities. The left ventricular internal  cavity size was the left  ventricle is normal in size. There is mildly increased left ventricular  hypertrophy.   Right Ventricle: The right ventricular size is normal. No increase in  right ventricular wall thickness. Global RV systolic function is has  normal systolic function. The tricuspid regurgitant velocity is 2.46 m/s,  and with an assumed right atrial pressure  of 8 mmHg, the estimated right ventricular systolic pressure is normal at  32.2 mmHg.   Left Atrium: Left atrial size was severely dilated.   Right Atrium: Right atrial size was normal in size   Pericardium: There is no evidence of  pericardial effusion.   Mitral Valve: The mitral valve is myxomatous. There is severe late  systolic prolapse of the posterior leaflet of the mitral valve. Moderate  to severe mitral valve regurgitation. No evidence of mitral valve stenosis  by observation. Degree of mitral  regurgitation is difficult to assess due to jet eccentricity. Consider  further evaluation with transesophageal echocardiogram, as clinically  indicated.   Tricuspid Valve: The tricuspid valve is grossly normal. Tricuspid valve  regurgitation mild-moderate.   Aortic Valve: The aortic valve is tricuspid. Aortic valve regurgitation is  mild. The aortic valve is structurally normal, with no evidence of  sclerosis or stenosis. Aortic valve mean gradient measures 4.0 mmHg.  Aortic valve peak gradient measures 8.5  mmHg. Aortic valve area, by VTI measures 2.40 cm.     Pulmonic Valve: The pulmonic valve was normal in structure. Pulmonic valve  regurgitation is not visualized. Pulmonic regurgitation is not visualized.  No evidence of pulmonic stenosis.   Aorta: The aortic root and ascending aorta are structurally normal, with  no evidence of dilitation.   Pulmonary Artery: The pulmonary artery is not well seen.   Venous: The inferior vena cava is dilated in size with greater than 50%  respiratory variability, suggesting right atrial pressure of 8 mmHg.   IAS/Shunts: The interatrial septum was not well visualized.     LEFT VENTRICLE  PLAX 2D  LVIDd:     4.60 cm    Diastology  LVIDs:     3.20 cm    LV e' lateral:  11.10 cm/s  LV PW:     1.20 cm    LV E/e' lateral: 5.9  LV IVS:    1.20 cm    LV e' medial:  7.83 cm/s  LVOT diam:   2.00 cm    LV E/e' medial: 8.4  LV SV:     56 ml  LV SV Index:  25.87  LVOT Area:   3.14 cm    LV Volumes (MOD)  LV area d, A2C:  39.40 cm  LV area d, A4C:  45.10 cm  LV area s, A2C:  22.10 cm  LV area s, A4C:  23.70 cm  LV major d, A2C:  10.50 cm  LV major d, A4C:  10.30 cm  LV major s, A2C:  8.65 cm  LV major s, A4C:  8.04 cm  LV vol d, MOD A2C: 124.0 ml  LV vol d, MOD A4C: 159.0 ml  LV vol s, MOD A2C: 50.0 ml  LV vol s, MOD A4C: 58.9 ml  LV SV MOD A2C:   74.0 ml  LV SV MOD A4C:   159.0 ml  LV SV MOD BP:   85.6 ml   RIGHT VENTRICLE       IVC  RV S prime:   14.10 cm/s IVC diam: 2.30 cm  TAPSE (M-mode): 2.3 cm   LEFT ATRIUM       Index    RIGHT ATRIUM      Index  LA diam:    4.60 cm 2.13 cm/m RA Area:   11.22 cm 5.20 cm/m  LA Vol (A2C):  89.2 ml 41.34 ml/m  LA Vol (A4C):  124.0 ml 57.48 ml/m  LA Biplane Vol: 130.0 ml 60.27 ml/m  AORTIC VALVE          PULMONIC VALVE  AV Area (Vmax):  2.47 cm  PV Vmax:    0.84 m/s  AV Area (Vmean):  2.69 cm  PV Peak grad: 2.8  mmHg  AV  Area (VTI):   2.40 cm  AV Vmax:      146.00 cm/s  AV Vmean:     96.500 cm/s  AV VTI:      0.297 m  AV Peak Grad:   8.5 mmHg  AV Mean Grad:   4.0 mmHg  LVOT Vmax:     115.00 cm/s  LVOT Vmean:    82.500 cm/s  LVOT VTI:     0.227 m  LVOT/AV VTI ratio: 0.76    AORTA  Ao Root diam: 3.30 cm  Ao Asc diam: 2.90 cm  Ao Arch diam: 2.6 cm   MITRAL VALVE            TRICUSPID VALVE  MV Area (PHT): 2.45 cm       TR Peak grad:  24.2 mmHg  MV PHT:    89.90 msec      TR Vmax:    246.00 cm/s  MV Decel Time: 310 msec  MV E velocity: 66.00 cm/s 103 cm/s SHUNTS  MV A velocity: 83.40 cm/s 70.3 cm/s Systemic VTI: 0.23 m  MV E/A ratio: 0.79    1.5    Systemic Diam: 2.00 cm     Nelva Bush MD  Electronically signed by Nelva Bush MD  Signature Date/Time: 03/17/2019/8:43:30 PM      TRANSESOPHAGEAL ECHOCARDIOGRAM  Images from transesophageal echocardiogram performed earlier today are reviewed.  Official report from this examination is not yet available.  Patient has myxomatous degenerative disease of the mitral valve with severe prolapse involving the lateral portion of the P1 segment of the posterior leaflet.  There are clearly ruptured primary chordae tendinae.  There is a severe jet of regurgitation directed in an eccentric fashion around the left atrium.  Quantification of the severity of mitral regurgitation appeared to be challenging because of the eccentric jet.  There appeared to be systolic blunting in the pulmonary veins but there did not appear to be clear evidence for flow reversal.  The left atrium was moderately dilated.  Left ventricle size and function was normal.  There was mild central aortic insufficiency.  Right ventricular size and function was normal.  There was mild tricuspid regurgitation.  no other significant abnormalities were noted.     RIGHT/LEFT HEART CATH AND CORONARY ANGIOGRAPHY    Conclusion    Prox Cx to Mid Cx lesion is 20% stenosed.   1. Mild nonobstructive CAD with mild stenosis of the mid-LAD and mid-LCx, no more than 30% vessel narrowing in either vessel 2. Normal right heart pressures, normal LVEDP/PCWP 3. Known severe mitral regurgitation by echo assessment, with well-compensated hemodynamics  Plan: cardiac surgical evaluation for mitral valve repair   Indications  Severe mitral insufficiency [I34.0 (ICD-10-CM)]  Procedural Details  Technical Details INDICATION: Severe primary mitral regurgitation  PROCEDURAL DETAILS: There was an indwelling IV in a right antecubital vein. Using normal sterile technique, the IV was changed out for a 5 Fr brachial sheath over a 0.018 inch wire. The right wrist was then prepped, draped, and anesthetized with 1% lidocaine. Using the modified Seldinger technique a 5/6 French Slender sheath was placed in the right radial artery. Intra-arterial verapamil was administered through the radial artery sheath. IV heparin was administered after a JR4 catheter was advanced into the central aorta. A Swan-Ganz catheter was used for the right heart catheterization. Standard protocol was followed for recording of right heart pressures and sampling of oxygen saturations. Fick cardiac output was calculated. Standard Judkins catheters were  used for selective coronary angiography. LV pressure is recorded and an aortic valve pullback is performed. There were no immediate procedural complications. The patient was transferred to the post catheterization recovery area for further monitoring.    Estimated blood loss <50 mL.   During this procedure medications were administered to achieve and maintain moderate conscious sedation while the patient's heart rate, blood pressure, and oxygen saturation were continuously monitored and I was present face-to-face 100% of this time.   Radiation/Fluoro  Fluoro time: 4.5 (min) DAP: 13.3  (Gycm2) Cumulative Air Kerma: 231.3 (mGy)  Coronary Findings  Diagnostic Dominance: Right Left Main  The vessel exhibits minimal luminal irregularities.  Left Anterior Descending  Mid LAD lesion 25% stenosed  Mid LAD lesion is 25% stenosed.  Left Circumflex  There is mild focal disease in the vessel.  Prox Cx to Mid Cx lesion 20% stenosed  Prox Cx to Mid Cx lesion is 20% stenosed.  Right Coronary Artery  The vessel exhibits minimal luminal irregularities.  Intervention  No interventions have been documented. Coronary Diagrams  Diagnostic Dominance: Right  Intervention     Impression:  Patient has mitral valve prolapse with stage C1 severe asymptomatic primary mitral regurgitation.  I have personally reviewed the patient's recent echocardiograms and diagnostic cardiac catheterization.  The patient has classical fibroelastic deficiency type myxomatous degenerative disease involving the mitral valve with severe prolapse involving what appears to be a flail medial portion of the P1 segment of the posterior leaflet.  There are clearly ruptured primary chordae tendinae and a severe jet of regurgitation directed in an eccentric fashion around the left atrium.  There is moderate left atrial enlargement.  There is normal left ventricular size and systolic function.  There is mild central aortic insufficiency.  Diagnostic cardiac catheterization is notable for the absence of significant coronary artery disease and reveals normal right heart pressures.  Options include continued close observation on medical therapy versus elective mitral valve repair.  Based upon review of the patient's clinical history and transesophageal echocardiogram I feel that the patient is a good candidate for surgical intervention with anticipation of less than 1% risk of operative mortality or significant morbidity and greater than 95% likelihood of successful and durable mitral valve repair.  The patient appears to be  a good candidate for minimally invasive approach for surgery.   Plan:  The patient and his wife were counseled at length regarding the indications, risks and potential benefits of mitral valve repair.  The rationale for elective surgery has been explained, including a comparison between surgery and continued medical therapy with close follow-up.  The likelihood of successful and durable mitral valve repair has been discussed with particular reference to the findings of their recent echocardiogram.  Based upon these findings and previous experience, I have quoted them a greater than 95 percent likelihood of successful valve repair with less than 1 percent risk of mortality or major morbidity.  Alternative surgical approaches have been discussed including a comparison between conventional sternotomy and minimally-invasive techniques.  The relative risks and benefits of each have been reviewed as they pertain to the patient's specific circumstances, and expectations for the patient's postoperative convalescence has been discussed.  The patient desires to proceed with surgery in the near future.    The patient understands and accepts all potential risks of surgery including but not limited to risk of death, stroke or other neurologic complication, myocardial infarction, congestive heart failure, respiratory failure, renal failure, bleeding requiring transfusion and/or reexploration, arrhythmia,  infection or other wound complications, pneumonia, pleural and/or pericardial effusion, pulmonary embolus, aortic dissection or other major vascular complication, or delayed complications related to valve repair or replacement including but not limited to structural valve deterioration and failure, thrombosis, embolization, endocarditis, or paravalvular leak.  Specific risks potentially related to the minimally-invasive approach were discussed at length, including but not limited to risk of conversion to full or partial  sternotomy, aortic dissection or other major vascular complication, unilateral acute lung injury or pulmonary edema, phrenic nerve dysfunction or paralysis, rib fracture, chronic pain, lung hernia, or lymphocele. All of his questions have been answered.   I spent in excess of 90 minutes during the conduct of this office consultation and >50% of this time involved direct face-to-face encounter with the patient for counseling and/or coordination of their care.    Valentina Gu. Roxy Manns, MD 05/06/2019 5:00 PM

## 2019-05-06 NOTE — Interval H&P Note (Signed)
History and Physical Interval Note:  05/06/2019 10:46 AM  [name], MD  has presented today for surgery, with the diagnosis of MITRAL REGURGITATION.  The various methods of treatment have been discussed with the patient and family. After consideration of risks, benefits and other options for treatment, the patient has consented to  Procedure(s): TRANSESOPHAGEAL ECHOCARDIOGRAM (TEE) (N/A) as a surgical intervention.  The patient's history has been reviewed, patient examined, no change in status, stable for surgery.  I have reviewed the patient's chart and labs.  Questions were answered to the patient's satisfaction.     [name]

## 2019-05-06 NOTE — Anesthesia Procedure Notes (Addendum)
Procedure Name: MAC Date/Time: 05/06/2019 10:55 AM Performed by: Janene Harvey, CRNA Pre-anesthesia Checklist: Patient identified, Emergency Drugs available, Suction available and Patient being monitored Oxygen Delivery Method: Nasal cannula Dental Injury: Teeth and Oropharynx as per pre-operative assessment

## 2019-05-06 NOTE — Anesthesia Postprocedure Evaluation (Signed)
Anesthesia Post Note  Patient: Ryan Carbon, MD  Procedure(s) Performed: TRANSESOPHAGEAL ECHOCARDIOGRAM (TEE) (N/A )     Patient location during evaluation: Endoscopy Anesthesia Type: General Level of consciousness: awake and sedated Pain management: pain level controlled Vital Signs Assessment: post-procedure vital signs reviewed and stable Respiratory status: spontaneous breathing Cardiovascular status: stable Postop Assessment: no apparent nausea or vomiting Anesthetic complications: no    Last Vitals:  Vitals:   05/06/19 1205 05/06/19 1237  BP: (!) 156/89   Pulse: (!) 51   Resp: 12   Temp:    SpO2: 100% 100%    Last Pain:  Vitals:   05/06/19 1245  TempSrc:   PainSc: 0-No pain   Pain Goal:                   Huston Foley

## 2019-05-06 NOTE — Interval H&P Note (Signed)
History and Physical Interval Note:  05/06/2019 12:46 PM  Ryan Carbon, MD  has presented today for surgery, with the diagnosis of Mitral regurgitation.  The various methods of treatment have been discussed with the patient and family. After consideration of risks, benefits and other options for treatment, the patient has consented to  Procedure(s): RIGHT/LEFT HEART CATH AND CORONARY ANGIOGRAPHY (N/A) as a surgical intervention.  The patient's history has been reviewed, patient examined, no change in status, stable for surgery.  I have reviewed the patient's chart and labs.  Questions were answered to the patient's satisfaction.     Sherren Mocha

## 2019-05-06 NOTE — CV Procedure (Signed)
    TRANSESOPHAGEAL ECHOCARDIOGRAM   NAME:  [name], MD   MRN: [id number] DOB:  1955/09/21   ADMIT DATE: 05/06/2019  INDICATIONS: Mitral regurgitation  PROCEDURE:   Informed consent was obtained prior to the procedure. The risks, benefits and alternatives for the procedure were discussed and the patient comprehended these risks.  Risks include, but are not limited to, cough, sore throat, vomiting, nausea, somnolence, esophageal and stomach trauma or perforation, bleeding, low blood pressure, aspiration, pneumonia, infection, trauma to the teeth and death.    Procedural time out performed. The oropharynx was anesthetized with topical 1% benzocaine.  Anesthesia was administered by Dr. Jillyn Hidden and Reather Laurence, CRNA.  The transesophageal probe was inserted in the esophagus and stomach without difficulty and multiple views were obtained.    COMPLICATIONS:    There were no immediate complications.  FINDINGS:  LEFT VENTRICLE: EF = 60-65% No regional wall motion abnormalities.  RIGHT VENTRICLE: Normal size and function.   LEFT ATRIUM: No thrombus/mass.  LEFT ATRIAL APPENDAGE: No thrombus/mass.   RIGHT ATRIUM: No thrombus/mass.  AORTIC VALVE:  Trileaflet. Mild regurgitation. No vegetation.  MITRAL VALVE:    P1 flail.  Severe regurgitation. No vegetation.  TRICUSPID VALVE: Normal structure. Mild regurgitation. No vegetation.  PULMONIC VALVE: Grossly normal structure. Trivial regurgitation. No apparent vegetation.  INTERATRIAL SEPTUM: No PFO or ASD seen by color Doppler.  PERICARDIUM: No effusion noted.  AORTA: Normal size  CONCLUSION:  P1 flail.  Severe mitral regurgitation  [place]  [address] [place],  [address] [phone]   6:43 PM

## 2019-05-06 NOTE — Progress Notes (Signed)
[address]       [place] [address]             [name] REPORT  Referring Provider is Ryan Mocha, MD PCP is Ryan Mc, MD  Chief Complaint  Patient presents with  . 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  Patient states that he has known of a presence of a heart murmur since 1991 when it was first noted on routine physical exam.  Echocardiogram performed at that time revealed mitral valve prolapse with mild mitral regurgitation.  He has had regular follow-up echocardiograms performed intermittently ever since, and recent follow-up echocardiograms have documented progressive enlargement of the left atrium but normal left ventricular size and systolic function.  The patient was seen in consultation recently by Dr. Burt Francis and subsequently scheduled for transesophageal echocardiogram and diagnostic cardiac catheterization which was performed earlier today.  TEE confirmed the presence of mitral valve prolapse with severe prolapse involving a portion of the posterior leaflet with ruptured primary chordae tendinae.  Function remain normal.  Diagnostic cardiac catheterization was notable for the absence of significant coronary artery disease and revealed normal right heart pressures.  Cardiothoracic surgical consultation was requested.  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  He exercises on a regular basis including bicycling on a road bike.  He states that over the past year or 2 his average speed on a bicycle has dropped a slight amount and he has started getting short of breath going up steep hills.  Otherwise he has absolutely no symptoms whatsoever.  He  denies any symptoms of shortness of breath with ordinary activity or at rest.  He denies any PND, orthopnea, or lower extremity edema.  He has never had any palpitations, dizzy spells, or atypical chest pain.  Past Medical History:  Diagnosis Date  . ALLERGIC RHINITIS 11/25/2008  . Allergy   . Atypical nevus of scapular region 01/12/2017  . Hearing loss, right 04/11/2012   Multiple surgeries in the past, partial hearing loss  . Hematospermia 02/19/2018  . History of brain concussion 01/18/2018  . Hyperlipidemia LDL goal <130 01/10/2016  . Lichen planus [id number]   Qualifier: Diagnosis of  By: Council Mechanic MD, [name]   . Mitral regurgitation, myxomatous 04/11/2012  . Mitral valve prolapse 04/11/2012   Severe, annual echos    Past Surgical History:  Procedure Laterality Date  . Bilateral laparoscopic hernia repairs  1992  . COLONOSCOPY  11/17/07   Dr. Henrene Pastor  . Echo (other)     positive for MVP, markedly myxomatous, marked prolapse TVP with Regurg (03/1990)  . Echo (other)  06/21/2000   mitral valve thickened, myxomatous vs degenerative, moderate MVP, moderate MR  . Echo (other)  07/26/2006   LVF normal, EF 55-65%, Triv AR mod MR, marked posterior leaflet MVP  . Echo (other)  06/15/2009   no change (EF 55-60% Mild Myxomatous Mitral Valve Mod MVP mod MR Mild LAE)  . Nasal Septoplasty (other)  2008   Dr. Janace Francis  . OSSICULAR RECONSTRUCTION     right ear  . TONSILLECTOMY     age 66    Family History  Problem Relation Age of Onset  .  Breast cancer Mother   . Hypertension Mother   . Diabetes Father        diet controlled, bypass fraft polyps vascular Dz.  . Congestive Heart Failure Father        died in 2 chf   . Peripheral Artery Disease Father 34  . Hyperthyroidism Sister   . Breast cancer Sister 15       died of tumor lysis syndrome   . Prostate cancer Brother        avoiding surgery  . Colon cancer Neg Hx   . Colon polyps Neg Hx   . Esophageal cancer Neg Hx   . Stomach  cancer Neg Hx   . Rectal cancer Neg Hx     Social History   Socioeconomic History  . Marital status: Married    Spouse name: Manuela Schwartz  . Number of children: 2  . Years of education: Not on file  . Highest education level: Not on file  Occupational History  . Occupation: physician    Employer: [employer]: [employer] at [employer] Use  . Smoking status: Never Smoker  . Smokeless tobacco: Never Used  Substance and Sexual Activity  . Alcohol use: Yes    Alcohol/week: 19.0 standard drinks    Types: 19 Glasses of wine per week  . Drug use: No  . Sexual activity: Yes    Partners: Female  Other Topics Concern  . Not on file  Social History Narrative   Physician.   Married to Alvo   2 Children   Daughter: [name]   1 son, [name], married with child   Social Determinants of Health   Financial Resource Strain:   . Difficulty of Paying Living Expenses: Not on file  Food Insecurity:   . Worried About Charity fundraiser in the Last Year: Not on file  . Ran Out of Food in the Last Year: Not on file  Transportation Needs:   . Lack of Transportation (Medical): Not on file  . Lack of Transportation (Non-Medical): Not on file  Physical Activity:   . Days of Exercise per Week: Not on file  . Minutes of Exercise per Session: Not on file  Stress:   . Feeling of Stress : Not on file  Social Connections:   . Frequency of Communication with Friends and Family: Not on file  . Frequency of Social Gatherings with Friends and Family: Not on file  . Attends Religious Services: Not on file  . Active Member of Clubs or Organizations: Not on file  . Attends Archivist Meetings: Not on file  . Marital Status: Not on file  Intimate Partner Violence:   . Fear of Current or Ex-Partner: Not on file  . Emotionally Abused: Not on file  . Physically Abused: Not on file  . Sexually Abused: Not on file    No current facility-administered medications for this visit.    No current outpatient medications on file.   Facility-Administered Medications Ordered in Other Visits  Medication Dose Route Frequency Provider Last Rate Last Admin  . 0.9 %  sodium chloride infusion  250 mL Intravenous PRN Ryan Mocha, MD      . 0.9 %  sodium chloride infusion  250 mL Intravenous PRN Ryan Mocha, MD      . 0.9% sodium chloride infusion  1 mL/kg/hr Intravenous Continuous Ryan Mocha, MD      . 0.9% sodium chloride infusion  1 mL/kg/hr Intravenous  Continuous Ryan Mocha, MD      . acetaminophen (TYLENOL) tablet 650 mg  650 mg Oral Q4H PRN Ryan Mocha, MD      . aspirin chewable tablet 81 mg  81 mg Oral Brock Ra, Legrand Como, MD      . hydrALAZINE (APRESOLINE) injection 10 mg  10 mg Intravenous Q20 Min PRN Ryan Mocha, MD      . labetalol (NORMODYNE) injection 10 mg  10 mg Intravenous Q10 min PRN Ryan Mocha, MD      . ondansetron [facility]) injection 4 mg  4 mg Intravenous Q6H PRN Ryan Mocha, MD      . sodium chloride flush (NS) 0.9 % injection 3 mL  3 mL Intravenous PRN Ryan Mocha, MD      . sodium chloride flush (NS) 0.9 % injection 3 mL  3 mL Intravenous Q12H Ryan Mocha, MD      . sodium chloride flush (NS) 0.9 % injection 3 mL  3 mL Intravenous PRN Ryan Mocha, MD        No Known Allergies    Review of Systems:   General:  normal appetite, normal energy, no weight gain, no weight loss, no fever  Cardiac:  no chest pain with exertion, no chest pain at rest, no SOB with exertion, no resting SOB, no PND, no orthopnea, no palpitations, no arrhythmia, no atrial fibrillation, no LE edema, no dizzy spells, no syncope  Respiratory:  no shortness of breath, no home oxygen, no productive cough, no dry cough, no bronchitis, no wheezing, no hemoptysis, cold-induced asthma, no pain with inspiration or cough, no sleep apnea, no CPAP at night  GI:   no difficulty swallowing, no reflux, no frequent heartburn, no hiatal hernia, no  abdominal pain, no constipation, no diarrhea, no hematochezia, no hematemesis, no melena  GU:   no dysuria,  no frequency, no urinary tract infection, no hematuria, no enlarged prostate, no kidney stones, no kidney disease  Vascular:  no pain suggestive of claudication, no pain in feet, no leg cramps, no varicose veins, no DVT, no non-healing foot ulcer  Neuro:   no stroke, no TIA's, no seizures, no headaches, no temporary blindness one eye,  no slurred speech, no peripheral neuropathy, no chronic pain, no instability of gait, no memory/cognitive dysfunction  Musculoskeletal: no arthritis, no joint swelling, no myalgias, no difficulty walking, normal mobility   Skin:   no rash, no itching, no skin infections, no pressure sores or ulcerations  Psych:   no anxiety, no depression, no nervousness, no unusual recent stress  Eyes:   no blurry vision, no floaters, no recent vision changes, + wears glasses or contacts  ENT:   + hearing loss, no loose or painful teeth, no dentures, last saw dentist Sept 2020  Hematologic:  no easy bruising, no abnormal bleeding, no clotting disorder, no frequent epistaxis  Endocrine:  no diabetes, does not check CBG's at home     Physical Exam:   BP (!) 169/90   Pulse 69   Temp 97.7 F (36.5 C) (Skin)   Resp 20   Ht 6\' 2"  (1.88 m)   Wt 204 lb (92.5 kg)   SpO2 99% Comment: RA  BMI 26.19 kg/m   General:    well-appearing  HEENT:  Unremarkable   Neck:   no JVD, no bruits, no adenopathy   Chest:   clear to auscultation, symmetrical breath sounds, no wheezes, no rhonchi   CV:   RRR, grade IV/VI holosystolic murmur  Abdomen:  soft, non-tender, no masses   Extremities:  warm, well-perfused, pulses palpable, no LE edema  Rectal/GU  Deferred  Neuro:   Grossly non-focal and symmetrical throughout  Skin:   Clean and dry, no rashes, no breakdown   Diagnostic Tests:  ECHOCARDIOGRAM REPORT       Patient Name:  [name] Date of Exam: 03/17/2019    Medical Rec #: [id number]    Height:    73.0 in  Accession #:  [id number]    Weight:    201.2 lb  Date of Birth: September 29, 1955    BSA:     2.16 m  Patient Age:  70 years     BP:      132/74 mmHg  Patient Gender: M        HR:      70 bpm.  Exam Location: [place]   Procedure: 2D Echo, Cardiac Doppler and Color Doppler   Indications:  I34.1 Nonrheumatic mitral (valve) prolapse    History:    Patient has prior history of Echocardiogram examinations,  most         recent 01/17/2017. Mitral Valve Disease,  Signs/Symptoms:Murmur;         Risk Factors:Dyslipidemia and Non-Smoker.    Sonographer:  Pilar Jarvis RDMS, RVT, RDCS  Referring Phys: [place]    1. Left ventricular ejection fraction, by visual estimation, is 60 to  65%. The left ventricle has normal function. There is mildly increased  left ventricular hypertrophy.  2. The left ventricle has no regional wall motion abnormalities.  3. Global right ventricle has normal systolic function.The right  ventricular size is normal. No increase in right ventricular wall  thickness.  4. Left atrial size was severely dilated.  5. Right atrial size was normal.  6. Severe mitral valve prolapse involving the posterior leaflet.  7. The mitral valve is myxomatous. Moderate to severe mitral valve  regurgitation. No evidence of mitral stenosis.  8. Degree of mitral regurgitation is difficult to assess due to jet  eccentricity. Consider further evaluation with transesophageal  echocardiogram, as clinically indicated.  9. The tricuspid valve is grossly normal. Tricuspid valve regurgitation  mild-moderate.  10. The aortic valve is tricuspid. Aortic valve regurgitation is mild. No  evidence of aortic valve sclerosis or stenosis.  11. The pulmonic valve was normal in structure. Pulmonic valve  regurgitation is not visualized.  12.  Normal pulmonary artery systolic pressure.  13. The inferior vena cava is dilated in size with >50% respiratory  variability, suggesting right atrial pressure of 8 mmHg.  14. The interatrial septum was not well visualized.   FINDINGS  Left Ventricle: Left ventricular ejection fraction, by visual estimation,  is 60 to 65%. The left ventricle has normal function. The left ventricle  has no regional wall motion abnormalities. The left ventricular internal  cavity size was the left  ventricle is normal in size. There is mildly increased left ventricular  hypertrophy.   Right Ventricle: The right ventricular size is normal. No increase in  right ventricular wall thickness. Global RV systolic function is has  normal systolic function. The tricuspid regurgitant velocity is 2.46 m/s,  and with an assumed right atrial pressure  of 8 mmHg, the estimated right ventricular systolic pressure is normal at  32.2 mmHg.   Left Atrium: Left atrial size was severely dilated.   Right Atrium: Right atrial size was normal in size   Pericardium: There is no evidence of  pericardial effusion.   Mitral Valve: The mitral valve is myxomatous. There is severe late  systolic prolapse of the posterior leaflet of the mitral valve. Moderate  to severe mitral valve regurgitation. No evidence of mitral valve stenosis  by observation. Degree of mitral  regurgitation is difficult to assess due to jet eccentricity. Consider  further evaluation with transesophageal echocardiogram, as clinically  indicated.   Tricuspid Valve: The tricuspid valve is grossly normal. Tricuspid valve  regurgitation mild-moderate.   Aortic Valve: The aortic valve is tricuspid. Aortic valve regurgitation is  mild. The aortic valve is structurally normal, with no evidence of  sclerosis or stenosis. Aortic valve mean gradient measures 4.0 mmHg.  Aortic valve peak gradient measures 8.5  mmHg. Aortic valve area, by VTI measures 2.40 cm.     Pulmonic Valve: The pulmonic valve was normal in structure. Pulmonic valve  regurgitation is not visualized. Pulmonic regurgitation is not visualized.  No evidence of pulmonic stenosis.   Aorta: The aortic root and ascending aorta are structurally normal, with  no evidence of dilitation.   Pulmonary Artery: The pulmonary artery is not well seen.   Venous: The inferior vena cava is dilated in size with greater than 50%  respiratory variability, suggesting right atrial pressure of 8 mmHg.   IAS/Shunts: The interatrial septum was not well visualized.     LEFT VENTRICLE  PLAX 2D  LVIDd:     4.60 cm    Diastology  LVIDs:     3.20 cm    LV e' lateral:  11.10 cm/s  LV PW:     1.20 cm    LV E/e' lateral: 5.9  LV IVS:    1.20 cm    LV e' medial:  7.83 cm/s  LVOT diam:   2.00 cm    LV E/e' medial: 8.4  LV SV:     56 ml  LV SV Index:  25.87  LVOT Area:   3.14 cm    LV Volumes (MOD)  LV area d, A2C:  39.40 cm  LV area d, A4C:  45.10 cm  LV area s, A2C:  22.10 cm  LV area s, A4C:  23.70 cm  LV major d, A2C:  10.50 cm  LV major d, A4C:  10.30 cm  LV major s, A2C:  8.65 cm  LV major s, A4C:  8.04 cm  LV vol d, MOD A2C: 124.0 ml  LV vol d, MOD A4C: 159.0 ml  LV vol s, MOD A2C: 50.0 ml  LV vol s, MOD A4C: 58.9 ml  LV SV MOD A2C:   74.0 ml  LV SV MOD A4C:   159.0 ml  LV SV MOD BP:   85.6 ml   RIGHT VENTRICLE       IVC  RV S prime:   14.10 cm/s IVC diam: 2.30 cm  TAPSE (M-mode): 2.3 cm   LEFT ATRIUM       Index    RIGHT ATRIUM      Index  LA diam:    4.60 cm 2.13 cm/m RA Area:   11.22 cm 5.20 cm/m  LA Vol (A2C):  89.2 ml 41.34 ml/m  LA Vol (A4C):  124.0 ml 57.48 ml/m  LA Biplane Vol: 130.0 ml 60.27 ml/m  AORTIC VALVE          PULMONIC VALVE  AV Area (Vmax):  2.47 cm  PV Vmax:    0.84 m/s  AV Area (Vmean):  2.69 cm  PV Peak grad: 2.8  mmHg  AV  Area (VTI):   2.40 cm  AV Vmax:      146.00 cm/s  AV Vmean:     96.500 cm/s  AV VTI:      0.297 m  AV Peak Grad:   8.5 mmHg  AV Mean Grad:   4.0 mmHg  LVOT Vmax:     115.00 cm/s  LVOT Vmean:    82.500 cm/s  LVOT VTI:     0.227 m  LVOT/AV VTI ratio: 0.76    AORTA  Ao Root diam: 3.30 cm  Ao Asc diam: 2.90 cm  Ao Arch diam: 2.6 cm   MITRAL VALVE            TRICUSPID VALVE  MV Area (PHT): 2.45 cm       TR Peak grad:  24.2 mmHg  MV PHT:    89.90 msec      TR Vmax:    246.00 cm/s  MV Decel Time: 310 msec  MV E velocity: 66.00 cm/s 103 cm/s SHUNTS  MV A velocity: 83.40 cm/s 70.3 cm/s Systemic VTI: 0.23 m  MV E/A ratio: 0.79    1.5    Systemic Diam: 2.00 cm     Nelva Bush MD  Electronically signed by Nelva Bush MD  Signature Date/Time: 03/17/2019/8:43:30 PM      TRANSESOPHAGEAL ECHOCARDIOGRAM  Images from transesophageal echocardiogram performed earlier today are reviewed.  Official report from this examination is not yet available.  Patient has myxomatous degenerative disease of the mitral valve with severe prolapse involving the lateral portion of the P1 segment of the posterior leaflet.  There are clearly ruptured primary chordae tendinae.  There is a severe jet of regurgitation directed in an eccentric fashion around the left atrium.  Quantification of the severity of mitral regurgitation appeared to be challenging because of the eccentric jet.  There appeared to be systolic blunting in the pulmonary veins but there did not appear to be clear evidence for flow reversal.  The left atrium was moderately dilated.  Left ventricle size and function was normal.  There was mild central aortic insufficiency.  Right ventricular size and function was normal.  There was mild tricuspid regurgitation.  no other significant abnormalities were noted.     RIGHT/LEFT HEART CATH AND CORONARY ANGIOGRAPHY    Conclusion    Prox Cx to Mid Cx lesion is 20% stenosed.   1. Mild nonobstructive CAD with mild stenosis of the mid-LAD and mid-LCx, no more than 30% vessel narrowing in either vessel 2. Normal right heart pressures, normal LVEDP/PCWP 3. Known severe mitral regurgitation by echo assessment, with well-compensated hemodynamics  Plan: cardiac surgical evaluation for mitral valve repair   Indications  Severe mitral insufficiency [I34.0 (ICD-10-CM)]  Procedural Details  Technical Details INDICATION: Severe primary mitral regurgitation  PROCEDURAL DETAILS: There was an indwelling IV in a right antecubital vein. Using normal sterile technique, the IV was changed out for a 5 Fr brachial sheath over a 0.018 inch wire. The right wrist was then prepped, draped, and anesthetized with 1% lidocaine. Using the modified Seldinger technique a 5/6 French Slender sheath was placed in the right radial artery. Intra-arterial verapamil was administered through the radial artery sheath. IV heparin was administered after a JR4 catheter was advanced into the central aorta. A Swan-Ganz catheter was used for the right heart catheterization. Standard protocol was followed for recording of right heart pressures and sampling of oxygen saturations. Fick cardiac output was calculated. Standard Judkins catheters were  used for selective coronary angiography. LV pressure is recorded and an aortic valve pullback is performed. There were no immediate procedural complications. The patient was transferred to the post catheterization recovery area for further monitoring.    Estimated blood loss <50 mL.   During this procedure medications were administered to achieve and maintain moderate conscious sedation while the patient's heart rate, blood pressure, and oxygen saturation were continuously monitored and I was present face-to-face 100% of this time.   Radiation/Fluoro  Fluoro time: 4.5 (min) DAP: 13.3  (Gycm2) Cumulative Air Kerma: 231.3 (mGy)  Coronary Findings  Diagnostic Dominance: Right Left Main  The vessel exhibits minimal luminal irregularities.  Left Anterior Descending  Mid LAD lesion 25% stenosed  Mid LAD lesion is 25% stenosed.  Left Circumflex  There is mild focal disease in the vessel.  Prox Cx to Mid Cx lesion 20% stenosed  Prox Cx to Mid Cx lesion is 20% stenosed.  Right Coronary Artery  The vessel exhibits minimal luminal irregularities.  Intervention  No interventions have been documented. Coronary Diagrams  Diagnostic Dominance: Right  Intervention     Impression:  Patient has mitral valve prolapse with stage C1 severe asymptomatic primary mitral regurgitation.  I have personally reviewed the patient's recent echocardiograms and diagnostic cardiac catheterization.  The patient has classical fibroelastic deficiency type myxomatous degenerative disease involving the mitral valve with severe prolapse involving what appears to be a flail medial portion of the P1 segment of the posterior leaflet.  There are clearly ruptured primary chordae tendinae and a severe jet of regurgitation directed in an eccentric fashion around the left atrium.  There is moderate left atrial enlargement.  There is normal left ventricular size and systolic function.  There is mild central aortic insufficiency.  Diagnostic cardiac catheterization is notable for the absence of significant coronary artery disease and reveals normal right heart pressures.  Options include continued close observation on medical therapy versus elective mitral valve repair.  Based upon review of the patient's clinical history and transesophageal echocardiogram I feel that the patient is a good candidate for surgical intervention with anticipation of less than 1% risk of operative mortality or significant morbidity and greater than 95% likelihood of successful and durable mitral valve repair.  The patient appears to be  a good candidate for minimally invasive approach for surgery.   Plan:  The patient and his wife were counseled at length regarding the indications, risks and potential benefits of mitral valve repair.  The rationale for elective surgery has been explained, including a comparison between surgery and continued medical therapy with close follow-up.  The likelihood of successful and durable mitral valve repair has been discussed with particular reference to the findings of their recent echocardiogram.  Based upon these findings and previous experience, I have quoted them a greater than 95 percent likelihood of successful valve repair with less than 1 percent risk of mortality or major morbidity.  Alternative surgical approaches have been discussed including a comparison between conventional sternotomy and minimally-invasive techniques.  The relative risks and benefits of each have been reviewed as they pertain to the patient's specific circumstances, and expectations for the patient's postoperative convalescence has been discussed.  The patient desires to proceed with surgery in the near future.    The patient understands and accepts all potential risks of surgery including but not limited to risk of death, stroke or other neurologic complication, myocardial infarction, congestive heart failure, respiratory failure, renal failure, bleeding requiring transfusion and/or reexploration, arrhythmia,  infection or other wound complications, pneumonia, pleural and/or pericardial effusion, pulmonary embolus, aortic dissection or other major vascular complication, or delayed complications related to valve repair or replacement including but not limited to structural valve deterioration and failure, thrombosis, embolization, endocarditis, or paravalvular leak.  Specific risks potentially related to the minimally-invasive approach were discussed at length, including but not limited to risk of conversion to full or partial  sternotomy, aortic dissection or other major vascular complication, unilateral acute lung injury or pulmonary edema, phrenic nerve dysfunction or paralysis, rib fracture, chronic pain, lung hernia, or lymphocele. All of his questions have been answered.   I spent in excess of 90 minutes during the conduct of this office consultation and >50% of this time involved direct face-to-face encounter with the patient for counseling and/or coordination of their care.    Valentina Gu. Roxy Manns, MD 05/06/2019 5:00 PM

## 2019-05-06 NOTE — Patient Instructions (Signed)
  Continue taking all current medications without change through the day before surgery.  Make sure to bring all of your medications with you when you come for your Pre-Admission Testing appointment at [facility] Short-Stay Department.  Have nothing to eat or drink after midnight the night before surgery.  On the morning of surgery do not take any medications.  At your appointment for Pre-Admission Testing at the [facility] Short-Stay Department you will be asked to sign permission forms for your upcoming surgery.  By definition your signature on these forms implies that you and/or your designee provide full informed consent for your planned surgical procedure(s), that alternative treatment options have been discussed, that you understand and accept any and all potential risks, and that you have some understanding of what to expect for your post-operative convalescence.  For any major cardiac surgical procedure potential operative risks include but are not limited to at least some risk of death, stroke or other neurologic complication, myocardial infarction, congestive heart failure, respiratory failure, renal failure, bleeding requiring blood transfusion and/or reexploration, irregular heart rhythm, heart block or bradycardia requiring permanent pacemaker, pneumonia, pericardial effusion, pleural effusion, wound infection, pulmonary embolus or other thromboembolic complication, chronic pain, or other complications related to the specific procedure(s) performed.  Please call to schedule a follow-up appointment in our office prior to surgery if you have any unresolved questions about your planned surgical procedure, the associated risks, alternative treatment options, and/or expectations for your post-operative recovery.   

## 2019-05-06 NOTE — Anesthesia Preprocedure Evaluation (Addendum)
Anesthesia Evaluation  Patient identified by MRN, date of birth, ID band Patient awake    Reviewed: Allergy & Precautions, NPO status , Patient's Chart, lab work & pertinent test results  Airway Mallampati: I       Dental no notable dental hx. (+) Teeth Intact   Pulmonary    Pulmonary exam normal breath sounds clear to auscultation       Cardiovascular Normal cardiovascular exam+ Valvular Problems/Murmurs MR  Rhythm:Regular Rate:Normal + Systolic murmurs    Neuro/Psych negative neurological ROS     GI/Hepatic negative GI ROS, Neg liver ROS,   Endo/Other  negative endocrine ROS  Renal/GU negative Renal ROS     Musculoskeletal   Abdominal Normal abdominal exam  (+)   Peds  Hematology negative hematology ROS (+)   Anesthesia Other Findings ventricular ejection fraction, by visual estimation, is 60 to  65%. The left ventricle has normal function. There is mildly increased  left ventricular hypertrophy.  2. The left ventricle has no regional wall motion abnormalities.  3. Global right ventricle has normal systolic function.The right  ventricular size is normal. No increase in right ventricular wall  thickness.  4. Left atrial size was severely dilated.  5. Right atrial size was normal.  6. Severe mitral valve prolapse involving the posterior leaflet.  7. The mitral valve is myxomatous. Moderate to severe mitral valve  regurgitation. No evidence of mitral stenosis.  8. Degree of mitral regurgitation is difficult to assess due to jet  eccentricity. Consider further evaluation with transesophageal  echocardiogram, as clinically indicated.  9. The tricuspid valve is grossly normal. Tricuspid valve regurgitation  mild-moderate.  10. The aortic valve is tricuspid. Aortic valve regurgitation is mild. No  evidence of aortic valve sclerosis or stenosis.  11. The pulmonic valve was normal in structure. Pulmonic  valve  regurgitation is not visualized.  12. Normal pulmonary artery systolic pressure.  13. The inferior vena cava is dilated in size with >50% respiratory  variability, suggesting right atrial pressure of 8 mmHg.  14. The interatrial septum was not well visualized.   Reproductive/Obstetrics negative OB ROS                             Anesthesia Physical Anesthesia Plan  ASA: II  Anesthesia Plan: General   Post-op Pain Management:    Induction: Intravenous  PONV Risk Score and Plan: TIVA and Propofol infusion  Airway Management Planned: Mask and Natural Airway  Additional Equipment: None and TEE  Intra-op Plan:   Post-operative Plan:   Informed Consent: I have reviewed the patients History and Physical, chart, labs and discussed the procedure including the risks, benefits and alternatives for the proposed anesthesia with the patient or authorized representative who has indicated his/her understanding and acceptance.       Plan Discussed with: CRNA  Anesthesia Plan Comments:         Anesthesia Quick Evaluation

## 2019-05-06 NOTE — Progress Notes (Signed)
  Echocardiogram Echocardiogram Transesophageal has been performed.  [name] 05/06/2019, 11:43 AM

## 2019-05-06 NOTE — Discharge Instructions (Signed)
Radial Site Care  This sheet gives you information about how to care for yourself after your procedure. Your health care provider may also give you more specific instructions. If you have problems or questions, contact your health care provider. What can I expect after the procedure? After the procedure, it is common to have:  Bruising and tenderness at the catheter insertion area. Follow these instructions at home: Medicines  Take over-the-counter and prescription medicines only as told by your health care provider. Insertion site care  Follow instructions from your health care provider about how to take care of your insertion site. Make sure you: ? Wash your hands with soap and water before you change your bandage (dressing). If soap and water are not available, use hand sanitizer. ? Change your dressing as told by your health care provider. ? Leave stitches (sutures), skin glue, or adhesive strips in place. These skin closures may need to stay in place for 2 weeks or longer. If adhesive strip edges start to loosen and curl up, you may trim the loose edges. Do not remove adhesive strips completely unless your health care provider tells you to do that.  Check your insertion site every day for signs of infection. Check for: ? Redness, swelling, or pain. ? Fluid or blood. ? Pus or a bad smell. ? Warmth.  Do not take baths, swim, or use a hot tub until your health care provider approves.  You may shower 24-48 hours after the procedure, or as directed by your health care provider. ? Remove the dressing and gently wash the site with plain soap and water. ? Pat the area dry with a clean towel. ? Do not rub the site. That could cause bleeding.  Do not apply powder or lotion to the site. Activity   For 24 hours after the procedure, or as directed by your health care provider: ? Do not flex or bend the affected arm. ? Do not push or pull heavy objects with the affected arm. ? Do not  drive yourself home from the hospital or clinic. You may drive 24 hours after the procedure unless your health care provider tells you not to. ? Do not operate machinery or power tools.  Do not lift anything that is heavier than 10 lb (4.5 kg), or the limit that you are told, until your health care provider says that it is safe.  Ask your health care provider when it is okay to: ? Return to work or school. ? Resume usual physical activities or sports. ? Resume sexual activity. General instructions  If the catheter site starts to bleed, raise your arm and put firm pressure on the site. If the bleeding does not stop, get help right away. This is a medical emergency.  If you went home on the same day as your procedure, a responsible adult should be with you for the first 24 hours after you arrive home.  Keep all follow-up visits as told by your health care provider. This is important. Contact a health care provider if:  You have a fever.  You have redness, swelling, or yellow drainage around your insertion site. Get help right away if:  You have unusual pain at the radial site.  The catheter insertion area swells very fast.  The insertion area is bleeding, and the bleeding does not stop when you hold steady pressure on the area.  Your arm or hand becomes pale, cool, tingly, or numb. These symptoms may represent a serious problem   that is an emergency. Do not wait to see if the symptoms will go away. Get medical help right away. Call your local emergency services (911 in the [place] Do not drive yourself to the hospital. Summary  After the procedure, it is common to have bruising and tenderness at the site.  Follow instructions from your health care provider about how to take care of your radial site wound. Check the wound every day for signs of infection.  Do not lift anything that is heavier than 10 lb (4.5 kg), or the limit that you are told, until your health care provider says  that it is safe. This information is not intended to replace advice given to you by your health care provider. Make sure you discuss any questions you have with your health care provider. Document Revised: 04/24/2017 Document Reviewed: 04/24/2017 Elsevier Patient Education  2020 [employer]  

## 2019-05-06 NOTE — Transfer of Care (Signed)
Immediate Anesthesia Transfer of Care Note  Patient: [name], MD  Procedure(s) Performed: TRANSESOPHAGEAL ECHOCARDIOGRAM (TEE) (N/A )  Patient Location: Endoscopy Unit  Anesthesia Type:MAC  Level of Consciousness: drowsy  Airway & Oxygen Therapy: Patient Spontanous Breathing and Patient connected to nasal cannula oxygen  Post-op Assessment: Report given to RN and Post -op Vital signs reviewed and stable  Post vital signs: Reviewed  Last Vitals:  Vitals Value Taken Time  BP    Temp    Pulse 55 05/06/19 1146  Resp 9 05/06/19 1146  SpO2 100 % 05/06/19 1146  Vitals shown include unvalidated device data.  Last Pain:  Vitals:   05/06/19 1020  TempSrc: Temporal  PainSc: 0-No pain         Complications: No apparent anesthesia complications

## 2019-05-07 ENCOUNTER — Other Ambulatory Visit: Payer: Self-pay | Admitting: *Deleted

## 2019-05-07 ENCOUNTER — Encounter: Payer: Self-pay | Admitting: *Deleted

## 2019-05-07 DIAGNOSIS — I340 Nonrheumatic mitral (valve) insufficiency: Secondary | ICD-10-CM

## 2019-05-13 ENCOUNTER — Ambulatory Visit
Admission: RE | Admit: 2019-05-13 | Discharge: 2019-05-13 | Disposition: A | Discharge status: Home / Self Care | Payer: 59 | Source: Ambulatory Visit | Attending: Cardiovascular Disease | Admitting: Cardiovascular Disease

## 2019-05-13 ENCOUNTER — Encounter: Payer: 59 | Admitting: Thoracic Surgery (Cardiothoracic Vascular Surgery)

## 2019-05-13 DIAGNOSIS — I340 Nonrheumatic mitral (valve) insufficiency: Secondary | ICD-10-CM

## 2019-05-13 DIAGNOSIS — I708 Atherosclerosis of other arteries: Secondary | ICD-10-CM

## 2019-05-13 DIAGNOSIS — E041 Nontoxic single thyroid nodule: Secondary | ICD-10-CM

## 2019-05-13 DIAGNOSIS — I700 Atherosclerosis of aorta: Secondary | ICD-10-CM | POA: Diagnosis not present

## 2019-05-13 DIAGNOSIS — Z01818 Encounter for other preprocedural examination: Secondary | ICD-10-CM | POA: Diagnosis not present

## 2019-05-13 MED ORDER — IOPAMIDOL (ISOVUE-370) INJECTION 76%
75.0000 mL | Freq: Once | INTRAVENOUS | Status: AC | PRN
  Administered 2019-05-13: 75 mL via INTRAVENOUS

## 2019-05-17 ENCOUNTER — Telehealth (INDEPENDENT_AMBULATORY_CARE_PROVIDER_SITE_OTHER): Payer: 59 | Admitting: Thoracic Surgery (Cardiothoracic Vascular Surgery)

## 2019-05-17 DIAGNOSIS — Z712 Person consulting for explanation of examination or test findings: Secondary | ICD-10-CM

## 2019-05-17 NOTE — Progress Notes (Signed)
CT angiogram looks good.  No contraindications to minimally invasive approach for surgery.  Question raised about celiac artery compression is irrelevant in asymptomatic patients.

## 2019-05-18 NOTE — Pre-Procedure Instructions (Signed)
Ryan Carbon, MD  05/18/2019    Your procedure is scheduled on Thursday, May 21, 2019 at 7:30 AM.   Report to [facility] Entrance "A" Admitting Office at 5:30 AM.   Call this number if you have problems the morning of surgery: [phone]   Questions prior to day of surgery, please call [phone] between 8 & 4 PM.   Remember:  Do not eat or drink after midnight Wednesday, 05/20/19.  Take these medicines the morning of surgery with A SIP OF WATER: Fexofenadine (Allegra) - if needed, Tylenol - if needed, Albuterol inhaler - if needed (bring inhaler with you day of surgery)  Stop NSAIDS (Naproxen, Aleve, Ibuprofen, etc) as of today. Do not use Aspirin products (BC Powders, Goody's, etc), Multivitamins, Herbal medications or Fish Oil prior to surgery.    Do not wear jewelry.  Do not wear lotions, powders, cologne or deodorant.  Men may shave face and neck.  Do not bring valuables to the hospital.  [facility] is not responsible for any belongings or valuables.  Contacts, dentures or bridgework may not be worn into surgery.  Leave your suitcase in the car.  After surgery it may be brought to your room.  For patients admitted to the hospital, discharge time will be determined by your treatment team.  [facility] - Preparing for Surgery  Before surgery, you can play an important role.  Because skin is not sterile, your skin needs to be as free of germs as possible.  You can reduce the number of germs on you skin by washing with CHG (chlorahexidine gluconate) soap before surgery.  CHG is an antiseptic cleaner which kills germs and bonds with the skin to continue killing germs even after washing.  Oral Hygiene is also important in reducing the risk of infection.  Remember to brush your teeth with your regular toothpaste the morning of surgery.  Please DO NOT use if you have an allergy to CHG or antibacterial soaps.  If your skin becomes reddened/irritated stop using the  CHG and inform your nurse when you arrive at Short Stay.  Do not shave (including legs and underarms) for at least 48 hours prior to the first CHG shower.  You may shave your face.  Please follow these instructions carefully:   1.  Shower with CHG Soap the night before surgery and the morning of Surgery.  2.  If you choose to wash your hair, wash your hair first as usual with your normal shampoo.  3.  After you shampoo, rinse your hair and body thoroughly to remove the shampoo. 4.  Use CHG as you would any other liquid soap.  You can apply chg directly to the skin and wash gently with a      scrungie or washcloth.           5.  Apply the CHG Soap to your body ONLY FROM THE NECK DOWN.   Do not use on open wounds or open sores. Avoid contact with your eyes, ears, mouth and genitals (private parts).  Wash genitals (private parts) with your normal soap - do this prior to using CHG soap.  6.  Wash thoroughly, paying special attention to the area where your surgery will be performed.  7.  Thoroughly rinse your body with warm water from the neck down.  8.  DO NOT shower/wash with your normal soap after using and rinsing off the CHG Soap.  9.  Pat yourself dry with a  clean towel.            10.  Wear clean pajamas.            11.  Place clean sheets on your bed the night of your first shower and do not sleep with pets.  Day of Surgery  Shower as above. Do not apply any lotions/deodorants the morning of surgery.   Please wear clean clothes to the hospital. Remember to brush your teeth with toothpaste.  Please read over the fact sheets that you were given.

## 2019-05-19 ENCOUNTER — Encounter (HOSPITAL_COMMUNITY): Payer: Self-pay

## 2019-05-19 ENCOUNTER — Other Ambulatory Visit: Payer: Self-pay

## 2019-05-19 ENCOUNTER — Encounter (HOSPITAL_COMMUNITY)
Admit: 2019-05-19 | Discharge: 2019-05-19 | Disposition: A | Discharge status: Home / Self Care | Payer: 59 | Attending: Thoracic Surgery (Cardiothoracic Vascular Surgery) | Admitting: Thoracic Surgery (Cardiothoracic Vascular Surgery)

## 2019-05-19 ENCOUNTER — Ambulatory Visit (HOSPITAL_BASED_OUTPATIENT_CLINIC_OR_DEPARTMENT_OTHER)
Admit: 2019-05-19 | Discharge: 2019-05-19 | Disposition: A | Discharge status: Home / Self Care | Payer: 59 | Attending: Thoracic Surgery (Cardiothoracic Vascular Surgery) | Admitting: Thoracic Surgery (Cardiothoracic Vascular Surgery)

## 2019-05-19 ENCOUNTER — Other Ambulatory Visit (HOSPITAL_COMMUNITY)
Admit: 2019-05-19 | Discharge: 2019-05-19 | Disposition: A | Discharge status: Home / Self Care | Payer: 59 | Attending: Thoracic Surgery (Cardiothoracic Vascular Surgery) | Admitting: Thoracic Surgery (Cardiothoracic Vascular Surgery)

## 2019-05-19 ENCOUNTER — Ambulatory Visit (HOSPITAL_COMMUNITY)
Admission: RE | Admit: 2019-05-19 | Discharge: 2019-05-19 | Disposition: A | Discharge status: Home / Self Care | Payer: 59 | Source: Ambulatory Visit | Attending: Thoracic Surgery (Cardiothoracic Vascular Surgery) | Admitting: Thoracic Surgery (Cardiothoracic Vascular Surgery)

## 2019-05-19 DIAGNOSIS — J9811 Atelectasis: Secondary | ICD-10-CM | POA: Diagnosis not present

## 2019-05-19 DIAGNOSIS — I340 Nonrheumatic mitral (valve) insufficiency: Secondary | ICD-10-CM

## 2019-05-19 DIAGNOSIS — E8770 Fluid overload, unspecified: Secondary | ICD-10-CM | POA: Diagnosis not present

## 2019-05-19 DIAGNOSIS — D62 Acute posthemorrhagic anemia: Secondary | ICD-10-CM | POA: Diagnosis not present

## 2019-05-19 DIAGNOSIS — Z20822 Contact with and (suspected) exposure to covid-19: Secondary | ICD-10-CM | POA: Insufficient documentation

## 2019-05-19 DIAGNOSIS — E785 Hyperlipidemia, unspecified: Secondary | ICD-10-CM | POA: Diagnosis not present

## 2019-05-19 DIAGNOSIS — J45909 Unspecified asthma, uncomplicated: Secondary | ICD-10-CM | POA: Insufficient documentation

## 2019-05-19 DIAGNOSIS — Z01818 Encounter for other preprocedural examination: Secondary | ICD-10-CM | POA: Insufficient documentation

## 2019-05-19 DIAGNOSIS — K219 Gastro-esophageal reflux disease without esophagitis: Secondary | ICD-10-CM | POA: Diagnosis not present

## 2019-05-19 DIAGNOSIS — E041 Nontoxic single thyroid nodule: Secondary | ICD-10-CM | POA: Diagnosis not present

## 2019-05-19 DIAGNOSIS — Z006 Encounter for examination for normal comparison and control in clinical research program: Secondary | ICD-10-CM | POA: Diagnosis not present

## 2019-05-19 HISTORY — DX: Unspecified asthma, uncomplicated: J45.909

## 2019-05-19 HISTORY — DX: Gastro-esophageal reflux disease without esophagitis: K21.9

## 2019-05-19 LAB — PROTIME-INR
INR: 1 (ref 0.8–1.2)
Prothrombin Time: 12.9 seconds (ref 11.4–15.2)

## 2019-05-19 LAB — COMPREHENSIVE METABOLIC PANEL
ALT: 38 U/L (ref 0–44)
AST: 39 U/L (ref 15–41)
Albumin: 4.3 g/dL (ref 3.5–5.0)
Alkaline Phosphatase: 53 U/L (ref 38–126)
Anion gap: 9 (ref 5–15)
BUN: 15 mg/dL (ref 8–23)
CO2: 20 mmol/L — ABNORMAL LOW (ref 22–32)
Calcium: 9.6 mg/dL (ref 8.9–10.3)
Chloride: 105 mmol/L (ref 98–111)
Creatinine, Ser: 0.89 mg/dL (ref 0.61–1.24)
GFR calc Af Amer: >60 mL/min (ref >60)
GFR calc non Af Amer: >60 mL/min (ref >60)
Glucose, Bld: 171 mg/dL — ABNORMAL HIGH (ref 70–99)
Potassium: 4.2 mmol/L (ref 3.5–5.1)
Sodium: 134 mmol/L — ABNORMAL LOW (ref 135–145)
Total Bilirubin: 1.1 mg/dL (ref 0.3–1.2)
Total Protein: 6.8 g/dL (ref 6.5–8.1)

## 2019-05-19 LAB — BLOOD GAS, ARTERIAL
Acid-Base Excess: 0.2 mmol/L (ref 0.0–2.0)
Bicarbonate: 24.1 mmol/L (ref 20.0–28.0)
FIO2: 21
O2 Saturation: 97.4 %
Patient temperature: 37
pCO2 arterial: 37 mmHg (ref 32.0–48.0)
pH, Arterial: 7.429 (ref 7.350–7.450)
pO2, Arterial: 93 mmHg (ref 83.0–108.0)

## 2019-05-19 LAB — CBC
HCT: 45.8 % (ref 39.0–52.0)
Hemoglobin: 15.2 g/dL (ref 13.0–17.0)
MCH: 31 pg (ref 26.0–34.0)
MCHC: 33.2 g/dL (ref 30.0–36.0)
MCV: 93.3 fL (ref 80.0–100.0)
Platelets: 250 10*3/uL (ref 150–400)
RBC: 4.91 MIL/uL (ref 4.22–5.81)
RDW: 12.4 % (ref 11.5–15.5)
WBC: 6.4 10*3/uL (ref 4.0–10.5)
nRBC: 0.3 % — ABNORMAL HIGH (ref 0.0–0.2)

## 2019-05-19 LAB — URINALYSIS, ROUTINE W REFLEX MICROSCOPIC
Bilirubin Urine: NEGATIVE
Glucose, UA: NEGATIVE mg/dL
Hgb urine dipstick: NEGATIVE
Ketones, ur: NEGATIVE mg/dL
Leukocytes,Ua: NEGATIVE
Nitrite: NEGATIVE
Protein, ur: NEGATIVE mg/dL
Specific Gravity, Urine: 1.013 (ref 1.005–1.030)
pH: 5 (ref 5.0–8.0)

## 2019-05-19 LAB — HEMOGLOBIN A1C
Hgb A1c MFr Bld: 5.6 % (ref 4.8–5.6)
Mean Plasma Glucose: 114.02 mg/dL

## 2019-05-19 LAB — APTT: aPTT: 25 seconds (ref 24–36)

## 2019-05-19 LAB — SURGICAL PCR SCREEN
MRSA, PCR: NEGATIVE
Staphylococcus aureus: NEGATIVE

## 2019-05-19 LAB — ABO/RH: ABO/RH(D): B POS

## 2019-05-19 LAB — SARS CORONAVIRUS 2 (TAT 6-24 HRS): SARS Coronavirus 2: NEGATIVE

## 2019-05-19 NOTE — Progress Notes (Signed)
PCP - Dr. Derrel Nip Cardiologist - Dr. Burt Knack  Chest x-ray - today EKG - 04/29/19 Stress Test - denies ECHO - 05/06/19 Cardiac Cath - 05/06/19  COVID TEST- done today. Pt also has had both Covid vaccines   Anesthesia review: No  Patient denies shortness of breath, fever, cough and chest pain at PAT appointment   All instructions explained to the patient, with a verbal understanding of the material. Patient agrees to go over the instructions while at home for a better understanding. Patient also instructed to self quarantine after being tested for COVID-19. The opportunity to ask questions was provided.

## 2019-05-20 ENCOUNTER — Encounter: Payer: Self-pay | Admitting: Internal Medicine

## 2019-05-20 DIAGNOSIS — E041 Nontoxic single thyroid nodule: Secondary | ICD-10-CM | POA: Insufficient documentation

## 2019-05-20 DIAGNOSIS — I708 Atherosclerosis of other arteries: Secondary | ICD-10-CM | POA: Insufficient documentation

## 2019-05-20 MED ORDER — EPINEPHRINE HCL 5 MG/250ML IV SOLN IN NS
0.0000 ug/min | INTRAVENOUS | Status: DC
  Filled 2019-05-20: qty 250

## 2019-05-20 MED ORDER — MANNITOL 20 % IV SOLN
Freq: Once | INTRAVENOUS | Status: DC
  Filled 2019-05-20: qty 13

## 2019-05-20 MED ORDER — SODIUM CHLORIDE 0.9 % IV SOLN
1.5000 g | INTRAVENOUS | Status: AC
  Administered 2019-05-21: 1.5 g via INTRAVENOUS
  Filled 2019-05-20: qty 1.5

## 2019-05-20 MED ORDER — TRANEXAMIC ACID (OHS) PUMP PRIME SOLUTION
2.0000 mg/kg | INTRAVENOUS | Status: DC
  Filled 2019-05-20: qty 1.87

## 2019-05-20 MED ORDER — SODIUM CHLORIDE 0.9 % IV SOLN
750.0000 mg | INTRAVENOUS | Status: DC
  Filled 2019-05-20: qty 750

## 2019-05-20 MED ORDER — NOREPINEPHRINE 4 MG/250ML-% IV SOLN
0.0000 ug/min | INTRAVENOUS | Status: DC
  Filled 2019-05-20: qty 250

## 2019-05-20 MED ORDER — TRANEXAMIC ACID 1000 MG/10ML IV SOLN
1.5000 mg/kg/h | INTRAVENOUS | Status: AC
  Administered 2019-05-21: 09:00:00 1.5 mg/kg/h via INTRAVENOUS
  Filled 2019-05-20: qty 25

## 2019-05-20 MED ORDER — INSULIN REGULAR(HUMAN) IN NACL 100-0.9 UT/100ML-% IV SOLN
INTRAVENOUS | Status: AC
  Administered 2019-05-21: 1.4 [IU]/h via INTRAVENOUS
  Filled 2019-05-20: qty 100

## 2019-05-20 MED ORDER — PHENYLEPHRINE HCL-NACL 20-0.9 MG/250ML-% IV SOLN
30.0000 ug/min | INTRAVENOUS | Status: AC
  Administered 2019-05-21: 30 ug/min via INTRAVENOUS
  Filled 2019-05-20: qty 250

## 2019-05-20 MED ORDER — GLUTARALDEHYDE 0.625% SOAKING SOLUTION
TOPICAL | Status: DC
  Filled 2019-05-20: qty 50

## 2019-05-20 MED ORDER — VANCOMYCIN HCL 1000 MG IV SOLR
INTRAVENOUS | Status: AC
  Administered 2019-05-21: 1000 mL
  Filled 2019-05-20: qty 1000

## 2019-05-20 MED ORDER — NITROGLYCERIN IN D5W 200-5 MCG/ML-% IV SOLN
2.0000 ug/min | INTRAVENOUS | Status: DC
  Filled 2019-05-20: qty 250

## 2019-05-20 MED ORDER — TRANEXAMIC ACID (OHS) BOLUS VIA INFUSION
15.0000 mg/kg | INTRAVENOUS | Status: AC
  Administered 2019-05-21: 1401 mg via INTRAVENOUS
  Filled 2019-05-20: qty 1401

## 2019-05-20 MED ORDER — DEXMEDETOMIDINE HCL IN NACL 400 MCG/100ML IV SOLN
0.1000 ug/kg/h | INTRAVENOUS | Status: AC
  Administered 2019-05-21: .5 ug/kg/h via INTRAVENOUS
  Filled 2019-05-20: qty 100

## 2019-05-20 MED ORDER — POTASSIUM CHLORIDE 2 MEQ/ML IV SOLN
80.0000 meq | INTRAVENOUS | Status: DC
  Filled 2019-05-20: qty 40

## 2019-05-20 MED ORDER — SODIUM CHLORIDE 0.9 % IV SOLN
INTRAVENOUS | Status: DC
  Filled 2019-05-20: qty 30

## 2019-05-20 MED ORDER — MILRINONE LACTATE IN DEXTROSE 20-5 MG/100ML-% IV SOLN
0.3000 ug/kg/min | INTRAVENOUS | Status: DC
  Filled 2019-05-20: qty 100

## 2019-05-20 MED ORDER — PLASMA-LYTE 148 IV SOLN
INTRAVENOUS | Status: AC
  Administered 2019-05-21: 08:00:00 500 mL
  Filled 2019-05-20: qty 2.5

## 2019-05-20 MED ORDER — VANCOMYCIN HCL 1500 MG/300ML IV SOLN
1500.0000 mg | INTRAVENOUS | Status: AC
  Administered 2019-05-21: 1500 mg via INTRAVENOUS
  Filled 2019-05-20: qty 300

## 2019-05-20 NOTE — Assessment & Plan Note (Signed)
Dedicated thyroid ultrasound to be ordered at follow up

## 2019-05-21 ENCOUNTER — Other Ambulatory Visit: Payer: Self-pay

## 2019-05-21 ENCOUNTER — Inpatient Hospital Stay (HOSPITAL_COMMUNITY)
Admission: RE | Admit: 2019-05-21 | Discharge: 2019-05-25 | DRG: 267 | Disposition: A | Discharge status: Home / Self Care | Payer: 59 | Attending: Thoracic Surgery (Cardiothoracic Vascular Surgery) | Admitting: Thoracic Surgery (Cardiothoracic Vascular Surgery)

## 2019-05-21 ENCOUNTER — Inpatient Hospital Stay (HOSPITAL_COMMUNITY): Payer: 59

## 2019-05-21 ENCOUNTER — Encounter (HOSPITAL_COMMUNITY): Payer: Self-pay | Admitting: Thoracic Surgery (Cardiothoracic Vascular Surgery)

## 2019-05-21 ENCOUNTER — Encounter (HOSPITAL_COMMUNITY)
Admission: RE | Disposition: A | Discharge status: Home / Self Care | Payer: Self-pay | Source: Home / Self Care | Attending: Thoracic Surgery (Cardiothoracic Vascular Surgery)

## 2019-05-21 DIAGNOSIS — Z4682 Encounter for fitting and adjustment of non-vascular catheter: Secondary | ICD-10-CM

## 2019-05-21 DIAGNOSIS — Z8249 Family history of ischemic heart disease and other diseases of the circulatory system: Secondary | ICD-10-CM

## 2019-05-21 DIAGNOSIS — I341 Nonrheumatic mitral (valve) prolapse: Secondary | ICD-10-CM | POA: Diagnosis present

## 2019-05-21 DIAGNOSIS — Z8042 Family history of malignant neoplasm of prostate: Secondary | ICD-10-CM

## 2019-05-21 DIAGNOSIS — Z20822 Contact with and (suspected) exposure to covid-19: Secondary | ICD-10-CM | POA: Diagnosis present

## 2019-05-21 DIAGNOSIS — J9811 Atelectasis: Secondary | ICD-10-CM | POA: Diagnosis not present

## 2019-05-21 DIAGNOSIS — Z006 Encounter for examination for normal comparison and control in clinical research program: Secondary | ICD-10-CM

## 2019-05-21 DIAGNOSIS — J309 Allergic rhinitis, unspecified: Secondary | ICD-10-CM | POA: Diagnosis not present

## 2019-05-21 DIAGNOSIS — I340 Nonrheumatic mitral (valve) insufficiency: Secondary | ICD-10-CM | POA: Diagnosis not present

## 2019-05-21 DIAGNOSIS — Z952 Presence of prosthetic heart valve: Secondary | ICD-10-CM | POA: Diagnosis not present

## 2019-05-21 DIAGNOSIS — D62 Acute posthemorrhagic anemia: Secondary | ICD-10-CM | POA: Diagnosis not present

## 2019-05-21 DIAGNOSIS — E8770 Fluid overload, unspecified: Secondary | ICD-10-CM | POA: Diagnosis not present

## 2019-05-21 DIAGNOSIS — Z833 Family history of diabetes mellitus: Secondary | ICD-10-CM | POA: Diagnosis not present

## 2019-05-21 DIAGNOSIS — K219 Gastro-esophageal reflux disease without esophagitis: Secondary | ICD-10-CM | POA: Diagnosis present

## 2019-05-21 DIAGNOSIS — Z803 Family history of malignant neoplasm of breast: Secondary | ICD-10-CM | POA: Diagnosis not present

## 2019-05-21 DIAGNOSIS — I511 Rupture of chordae tendineae, not elsewhere classified: Secondary | ICD-10-CM | POA: Diagnosis not present

## 2019-05-21 DIAGNOSIS — E041 Nontoxic single thyroid nodule: Secondary | ICD-10-CM

## 2019-05-21 DIAGNOSIS — Z9889 Other specified postprocedural states: Secondary | ICD-10-CM

## 2019-05-21 DIAGNOSIS — R918 Other nonspecific abnormal finding of lung field: Secondary | ICD-10-CM | POA: Diagnosis not present

## 2019-05-21 DIAGNOSIS — I4430 Unspecified atrioventricular block: Secondary | ICD-10-CM | POA: Diagnosis not present

## 2019-05-21 DIAGNOSIS — E785 Hyperlipidemia, unspecified: Secondary | ICD-10-CM | POA: Diagnosis present

## 2019-05-21 HISTORY — PX: CLIPPING OF ATRIAL APPENDAGE: SHX5773

## 2019-05-21 HISTORY — PX: TEE WITHOUT CARDIOVERSION: SHX5443

## 2019-05-21 HISTORY — DX: Other specified postprocedural states: Z98.890

## 2019-05-21 HISTORY — PX: MITRAL VALVE REPAIR: SHX2039

## 2019-05-21 LAB — CBC
HCT: 37.7 % — ABNORMAL LOW (ref 39.0–52.0)
HCT: 35.4 % — ABNORMAL LOW (ref 39.0–52.0)
Hemoglobin: 12.7 g/dL — ABNORMAL LOW (ref 13.0–17.0)
Hemoglobin: 12.2 g/dL — ABNORMAL LOW (ref 13.0–17.0)
MCH: 31.4 pg (ref 26.0–34.0)
MCH: 31.9 pg (ref 26.0–34.0)
MCHC: 33.7 g/dL (ref 30.0–36.0)
MCHC: 34.5 g/dL (ref 30.0–36.0)
MCV: 93.1 fL (ref 80.0–100.0)
MCV: 92.4 fL (ref 80.0–100.0)
Platelets: 159 10*3/uL (ref 150–400)
Platelets: 146 10*3/uL — ABNORMAL LOW (ref 150–400)
RBC: 4.05 MIL/uL — ABNORMAL LOW (ref 4.22–5.81)
RBC: 3.83 MIL/uL — ABNORMAL LOW (ref 4.22–5.81)
RDW: 12.3 % (ref 11.5–15.5)
RDW: 12.4 % (ref 11.5–15.5)
WBC: 15.5 10*3/uL — ABNORMAL HIGH (ref 4.0–10.5)
WBC: 11 10*3/uL — ABNORMAL HIGH (ref 4.0–10.5)
nRBC: 0 % (ref 0.0–0.2)
nRBC: 0 % (ref 0.0–0.2)

## 2019-05-21 LAB — HEMOGLOBIN AND HEMATOCRIT, BLOOD
HCT: 32.6 % — ABNORMAL LOW (ref 39.0–52.0)
HCT: 30.9 % — ABNORMAL LOW (ref 39.0–52.0)
Hemoglobin: 11.2 g/dL — ABNORMAL LOW (ref 13.0–17.0)
Hemoglobin: 10.4 g/dL — ABNORMAL LOW (ref 13.0–17.0)

## 2019-05-21 LAB — POCT I-STAT 7, (LYTES, BLD GAS, ICA,H+H)
Acid-base deficit: 4 mmol/L — ABNORMAL HIGH (ref 0.0–2.0)
Bicarbonate: 23.2 mmol/L (ref 20.0–28.0)
Calcium, Ion: 1.21 mmol/L (ref 1.15–1.40)
HCT: 36 % — ABNORMAL LOW (ref 39.0–52.0)
Hemoglobin: 12.2 g/dL — ABNORMAL LOW (ref 13.0–17.0)
O2 Saturation: 98 %
Patient temperature: 37.1
Potassium: 5.3 mmol/L — ABNORMAL HIGH (ref 3.5–5.1)
Sodium: 140 mmol/L (ref 135–145)
TCO2: 25 mmol/L (ref 22–32)
pCO2 arterial: 49.8 mmHg — ABNORMAL HIGH (ref 32.0–48.0)
pH, Arterial: 7.277 — ABNORMAL LOW (ref 7.350–7.450)
pO2, Arterial: 130 mmHg — ABNORMAL HIGH (ref 83.0–108.0)

## 2019-05-21 LAB — PREPARE RBC (CROSSMATCH)

## 2019-05-21 LAB — APTT: aPTT: 26 seconds (ref 24–36)

## 2019-05-21 LAB — PROTIME-INR
INR: 1.4 — ABNORMAL HIGH (ref 0.8–1.2)
Prothrombin Time: 17.3 seconds — ABNORMAL HIGH (ref 11.4–15.2)

## 2019-05-21 LAB — BASIC METABOLIC PANEL
Anion gap: 4 — ABNORMAL LOW (ref 5–15)
BUN: 18 mg/dL (ref 8–23)
CO2: 19 mmol/L — ABNORMAL LOW (ref 22–32)
Calcium: 7.8 mg/dL — ABNORMAL LOW (ref 8.9–10.3)
Chloride: 111 mmol/L (ref 98–111)
Creatinine, Ser: 0.86 mg/dL (ref 0.61–1.24)
GFR calc Af Amer: >60 mL/min (ref >60)
GFR calc non Af Amer: >60 mL/min (ref >60)
Glucose, Bld: 132 mg/dL — ABNORMAL HIGH (ref 70–99)
Potassium: 4.8 mmol/L (ref 3.5–5.1)
Sodium: 134 mmol/L — ABNORMAL LOW (ref 135–145)

## 2019-05-21 LAB — MAGNESIUM: Magnesium: 3.3 mg/dL — ABNORMAL HIGH (ref 1.7–2.4)

## 2019-05-21 LAB — GLUCOSE, CAPILLARY
Glucose-Capillary: 96 mg/dL (ref 70–99)
Glucose-Capillary: 116 mg/dL — ABNORMAL HIGH (ref 70–99)
Glucose-Capillary: 111 mg/dL — ABNORMAL HIGH (ref 70–99)
Glucose-Capillary: 124 mg/dL — ABNORMAL HIGH (ref 70–99)
Glucose-Capillary: 125 mg/dL — ABNORMAL HIGH (ref 70–99)
Glucose-Capillary: 117 mg/dL — ABNORMAL HIGH (ref 70–99)

## 2019-05-21 LAB — PLATELET COUNT
Platelets: 183 10*3/uL (ref 150–400)
Platelets: 180 10*3/uL (ref 150–400)

## 2019-05-21 LAB — FIBRINOGEN: Fibrinogen: 194 mg/dL — ABNORMAL LOW (ref 210–475)

## 2019-05-21 SURGERY — REPAIR, MITRAL VALVE, MINIMALLY INVASIVE
Anesthesia: General | Site: Chest | Laterality: Right

## 2019-05-21 MED ORDER — MIDAZOLAM HCL 2 MG/2ML IJ SOLN: 2.0000 mg | INTRAMUSCULAR | Status: DC | PRN

## 2019-05-21 MED ORDER — ACETAMINOPHEN 500 MG PO TABS
1000.0000 mg | ORAL_TABLET | Freq: Four times a day (QID) | ORAL | Status: DC
  Administered 2019-05-21 – 2019-05-25 (×14): 1000 mg via ORAL
  Filled 2019-05-21 (×14): qty 2

## 2019-05-21 MED ORDER — LACTATED RINGERS IV SOLN: 500.0000 mL | Freq: Once | INTRAVENOUS | Status: DC | PRN

## 2019-05-21 MED ORDER — ONDANSETRON HCL 4 MG/2ML IJ SOLN
INTRAMUSCULAR | Status: AC
  Filled 2019-05-21: qty 2

## 2019-05-21 MED ORDER — FENTANYL CITRATE (PF) 250 MCG/5ML IJ SOLN
INTRAMUSCULAR | Status: AC
  Filled 2019-05-21: qty 10

## 2019-05-21 MED ORDER — SODIUM CHLORIDE 0.9 % IV SOLN
INTRAVENOUS | Status: DC | PRN
  Administered 2019-05-21: 750 mg via INTRAVENOUS

## 2019-05-21 MED ORDER — PROPOFOL 10 MG/ML IV BOLUS
INTRAVENOUS | Status: AC
  Filled 2019-05-21: qty 20

## 2019-05-21 MED ORDER — MIDAZOLAM HCL 2 MG/2ML IJ SOLN
INTRAMUSCULAR | Status: AC
  Filled 2019-05-21: qty 2

## 2019-05-21 MED ORDER — BUPIVACAINE HCL 0.5 % IJ SOLN
INTRAMUSCULAR | Status: DC | PRN
  Administered 2019-05-21: 30 mL

## 2019-05-21 MED ORDER — ROCURONIUM BROMIDE 10 MG/ML (PF) SYRINGE
PREFILLED_SYRINGE | INTRAVENOUS | Status: AC
  Filled 2019-05-21: qty 10

## 2019-05-21 MED ORDER — SUGAMMADEX SODIUM 200 MG/2ML IV SOLN
INTRAVENOUS | Status: DC | PRN
  Administered 2019-05-21: 200 mg via INTRAVENOUS

## 2019-05-21 MED ORDER — INSULIN REGULAR(HUMAN) IN NACL 100-0.9 UT/100ML-% IV SOLN: INTRAVENOUS | Status: DC

## 2019-05-21 MED ORDER — BUPIVACAINE LIPOSOME 1.3 % IJ SUSP
20.0000 mL | Freq: Once | INTRAMUSCULAR | Status: DC
  Filled 2019-05-21: qty 20

## 2019-05-21 MED ORDER — MONTELUKAST SODIUM 10 MG PO TABS
10.0000 mg | ORAL_TABLET | Freq: Every evening | ORAL | Status: DC
  Administered 2019-05-22 – 2019-05-24 (×3): 10 mg via ORAL
  Filled 2019-05-21 (×3): qty 1

## 2019-05-21 MED ORDER — ROCURONIUM BROMIDE 10 MG/ML (PF) SYRINGE
PREFILLED_SYRINGE | INTRAVENOUS | Status: AC
  Filled 2019-05-21: qty 20

## 2019-05-21 MED ORDER — BUPIVACAINE LIPOSOME 1.3 % IJ SUSP
INTRAMUSCULAR | Status: DC | PRN
  Administered 2019-05-21: 20 mL

## 2019-05-21 MED ORDER — HEPARIN SODIUM (PORCINE) 1000 UNIT/ML IJ SOLN
INTRAMUSCULAR | Status: DC | PRN
  Administered 2019-05-21: 32000 [IU] via INTRAVENOUS

## 2019-05-21 MED ORDER — ACETAMINOPHEN 160 MG/5ML PO SOLN: 650.0000 mg | Freq: Once | ORAL | Status: AC

## 2019-05-21 MED ORDER — CHLORHEXIDINE GLUCONATE 0.12 % MT SOLN
15.0000 mL | OROMUCOSAL | Status: AC
  Administered 2019-05-21: 17:00:00 15 mL via OROMUCOSAL

## 2019-05-21 MED ORDER — MORPHINE SULFATE (PF) 2 MG/ML IV SOLN
1.0000 mg | INTRAVENOUS | Status: DC | PRN
  Administered 2019-05-21: 22:00:00 2 mg via INTRAVENOUS
  Administered 2019-05-22 (×2): 4 mg via INTRAVENOUS
  Administered 2019-05-23: 2 mg via INTRAVENOUS
  Filled 2019-05-21: qty 1
  Filled 2019-05-21: qty 2
  Filled 2019-05-21: qty 1
  Filled 2019-05-21: qty 2

## 2019-05-21 MED ORDER — POTASSIUM CHLORIDE 10 MEQ/50ML IV SOLN: 10.0000 meq | INTRAVENOUS | Status: AC

## 2019-05-21 MED ORDER — ASPIRIN 81 MG PO CHEW: 324.0000 mg | CHEWABLE_TABLET | Freq: Every day | ORAL | Status: DC

## 2019-05-21 MED ORDER — ALBUMIN HUMAN 5 % IV SOLN
250.0000 mL | INTRAVENOUS | Status: DC | PRN
  Administered 2019-05-21: 19:00:00 12.5 g via INTRAVENOUS

## 2019-05-21 MED ORDER — FENTANYL CITRATE (PF) 250 MCG/5ML IJ SOLN
INTRAMUSCULAR | Status: DC | PRN
  Administered 2019-05-21 (×2): 25 ug via INTRAVENOUS
  Administered 2019-05-21: 100 ug via INTRAVENOUS
  Administered 2019-05-21: 25 ug via INTRAVENOUS
  Administered 2019-05-21: 275 ug via INTRAVENOUS
  Administered 2019-05-21: 25 ug via INTRAVENOUS
  Administered 2019-05-21: 100 ug via INTRAVENOUS

## 2019-05-21 MED ORDER — NITROGLYCERIN IN D5W 200-5 MCG/ML-% IV SOLN: 0.0000 ug/min | INTRAVENOUS | Status: DC

## 2019-05-21 MED ORDER — LACTATED RINGERS IV SOLN: INTRAVENOUS | Status: DC | PRN

## 2019-05-21 MED ORDER — ASPIRIN EC 325 MG PO TBEC: 325.0000 mg | DELAYED_RELEASE_TABLET | Freq: Every day | ORAL | Status: DC

## 2019-05-21 MED ORDER — MIDAZOLAM HCL 5 MG/5ML IJ SOLN
INTRAMUSCULAR | Status: DC | PRN
  Administered 2019-05-21 (×2): 1 mg via INTRAVENOUS

## 2019-05-21 MED ORDER — PROPOFOL 10 MG/ML IV BOLUS
INTRAVENOUS | Status: DC | PRN
  Administered 2019-05-21: 120 mg via INTRAVENOUS
  Administered 2019-05-21: 40 mg via INTRAVENOUS

## 2019-05-21 MED ORDER — SODIUM CHLORIDE 0.9 % IV SOLN: INTRAVENOUS | Status: DC

## 2019-05-21 MED ORDER — MAGNESIUM SULFATE 4 GM/100ML IV SOLN
4.0000 g | Freq: Once | INTRAVENOUS | Status: AC
  Administered 2019-05-21: 18:00:00 4 g via INTRAVENOUS
  Filled 2019-05-21: qty 100

## 2019-05-21 MED ORDER — ACETAMINOPHEN 650 MG RE SUPP
650.0000 mg | Freq: Once | RECTAL | Status: AC
  Administered 2019-05-21: 17:00:00 650 mg via RECTAL

## 2019-05-21 MED ORDER — LACTATED RINGERS IV SOLN: INTRAVENOUS | Status: DC

## 2019-05-21 MED ORDER — DOCUSATE SODIUM 100 MG PO CAPS
200.0000 mg | ORAL_CAPSULE | Freq: Every day | ORAL | Status: DC
  Administered 2019-05-22 – 2019-05-23 (×2): 200 mg via ORAL
  Filled 2019-05-21 (×3): qty 2

## 2019-05-21 MED ORDER — CHLORHEXIDINE GLUCONATE 4 % EX LIQD: 30.0000 mL | CUTANEOUS | Status: DC

## 2019-05-21 MED ORDER — CHLORHEXIDINE GLUCONATE 0.12 % MT SOLN
15.0000 mL | Freq: Once | OROMUCOSAL | Status: AC
  Administered 2019-05-21: 06:00:00 15 mL via OROMUCOSAL
  Filled 2019-05-21: qty 15

## 2019-05-21 MED ORDER — FENTANYL CITRATE (PF) 250 MCG/5ML IJ SOLN
INTRAMUSCULAR | Status: AC
  Filled 2019-05-21: qty 5

## 2019-05-21 MED ORDER — ONDANSETRON HCL 4 MG/2ML IJ SOLN: 4.0000 mg | Freq: Four times a day (QID) | INTRAMUSCULAR | Status: DC | PRN

## 2019-05-21 MED ORDER — BUPIVACAINE HCL 0.5 % IJ SOLN
INTRAMUSCULAR | Status: AC
  Filled 2019-05-21: qty 1

## 2019-05-21 MED ORDER — METOPROLOL TARTRATE 12.5 MG HALF TABLET: 12.5000 mg | ORAL_TABLET | Freq: Two times a day (BID) | ORAL | Status: DC

## 2019-05-21 MED ORDER — DEXAMETHASONE SODIUM PHOSPHATE 10 MG/ML IJ SOLN
INTRAMUSCULAR | Status: AC
  Filled 2019-05-21: qty 1

## 2019-05-21 MED ORDER — SODIUM CHLORIDE 0.9 % IV SOLN
1.5000 g | Freq: Two times a day (BID) | INTRAVENOUS | Status: AC
  Administered 2019-05-21 – 2019-05-23 (×4): 1.5 g via INTRAVENOUS
  Filled 2019-05-21 (×4): qty 1.5

## 2019-05-21 MED ORDER — SODIUM CHLORIDE 0.9 % IV SOLN: 250.0000 mL | INTRAVENOUS | Status: DC

## 2019-05-21 MED ORDER — DEXTROSE 50 % IV SOLN: 0.0000 mL | INTRAVENOUS | Status: DC | PRN

## 2019-05-21 MED ORDER — BISACODYL 10 MG RE SUPP: 10.0000 mg | Freq: Every day | RECTAL | Status: DC

## 2019-05-21 MED ORDER — SODIUM CHLORIDE 0.45 % IV SOLN: INTRAVENOUS | Status: DC | PRN

## 2019-05-21 MED ORDER — SODIUM CHLORIDE 0.9% FLUSH
3.0000 mL | Freq: Two times a day (BID) | INTRAVENOUS | Status: DC
  Administered 2019-05-22 – 2019-05-23 (×3): 3 mL via INTRAVENOUS

## 2019-05-21 MED ORDER — METOPROLOL TARTRATE 12.5 MG HALF TABLET
12.5000 mg | ORAL_TABLET | Freq: Once | ORAL | Status: DC
  Filled 2019-05-21: qty 1

## 2019-05-21 MED ORDER — DEXMEDETOMIDINE HCL IN NACL 400 MCG/100ML IV SOLN: 0.0000 ug/kg/h | INTRAVENOUS | Status: DC

## 2019-05-21 MED ORDER — OXYCODONE HCL 5 MG PO TABS
5.0000 mg | ORAL_TABLET | ORAL | Status: DC | PRN
  Administered 2019-05-21 – 2019-05-22 (×2): 5 mg via ORAL
  Administered 2019-05-22 (×2): 10 mg via ORAL
  Filled 2019-05-21: qty 1
  Filled 2019-05-21: qty 2
  Filled 2019-05-21: qty 1
  Filled 2019-05-21: qty 2

## 2019-05-21 MED ORDER — DEXAMETHASONE SODIUM PHOSPHATE 10 MG/ML IJ SOLN
INTRAMUSCULAR | Status: DC | PRN
  Administered 2019-05-21: 10 mg via INTRAVENOUS

## 2019-05-21 MED ORDER — TRAMADOL HCL 50 MG PO TABS
50.0000 mg | ORAL_TABLET | ORAL | Status: DC | PRN
  Administered 2019-05-21: 20:00:00 50 mg via ORAL
  Filled 2019-05-21: qty 1

## 2019-05-21 MED ORDER — ONDANSETRON HCL 4 MG/2ML IJ SOLN
INTRAMUSCULAR | Status: DC | PRN
  Administered 2019-05-21: 4 mg via INTRAVENOUS

## 2019-05-21 MED ORDER — BISACODYL 5 MG PO TBEC
10.0000 mg | DELAYED_RELEASE_TABLET | Freq: Every day | ORAL | Status: DC
  Administered 2019-05-22: 10:00:00 10 mg via ORAL
  Filled 2019-05-21: qty 2

## 2019-05-21 MED ORDER — 0.9 % SODIUM CHLORIDE (POUR BTL) OPTIME
TOPICAL | Status: DC | PRN
  Administered 2019-05-21: 4000 mL

## 2019-05-21 MED ORDER — ACETAMINOPHEN 160 MG/5ML PO SOLN: 1000.0000 mg | Freq: Four times a day (QID) | ORAL | Status: DC

## 2019-05-21 MED ORDER — METOPROLOL TARTRATE 25 MG/10 ML ORAL SUSPENSION: 12.5000 mg | Freq: Two times a day (BID) | ORAL | Status: DC

## 2019-05-21 MED ORDER — ROCURONIUM BROMIDE 10 MG/ML (PF) SYRINGE
PREFILLED_SYRINGE | INTRAVENOUS | Status: DC | PRN
  Administered 2019-05-21: 50 mg via INTRAVENOUS
  Administered 2019-05-21: 40 mg via INTRAVENOUS
  Administered 2019-05-21: 50 mg via INTRAVENOUS
  Administered 2019-05-21: 60 mg via INTRAVENOUS
  Administered 2019-05-21: 20 mg via INTRAVENOUS
  Administered 2019-05-21 (×2): 30 mg via INTRAVENOUS
  Administered 2019-05-21: 20 mg via INTRAVENOUS
  Administered 2019-05-21: 30 mg via INTRAVENOUS

## 2019-05-21 MED ORDER — METOPROLOL TARTRATE 5 MG/5ML IV SOLN: 2.5000 mg | INTRAVENOUS | Status: DC | PRN

## 2019-05-21 MED ORDER — SODIUM CHLORIDE 0.9% FLUSH: 3.0000 mL | INTRAVENOUS | Status: DC | PRN

## 2019-05-21 MED ORDER — PANTOPRAZOLE SODIUM 40 MG PO TBEC
40.0000 mg | DELAYED_RELEASE_TABLET | Freq: Every day | ORAL | Status: DC
  Administered 2019-05-23 – 2019-05-25 (×3): 40 mg via ORAL
  Filled 2019-05-21 (×3): qty 1

## 2019-05-21 MED ORDER — CHLORHEXIDINE GLUCONATE CLOTH 2 % EX PADS
6.0000 | MEDICATED_PAD | Freq: Every day | CUTANEOUS | Status: DC
  Administered 2019-05-21 – 2019-05-23 (×3): 6 via TOPICAL

## 2019-05-21 MED ORDER — BUPIVACAINE HCL (PF) 0.5 % IJ SOLN
INTRAMUSCULAR | Status: AC
  Filled 2019-05-21: qty 30

## 2019-05-21 MED ORDER — FAMOTIDINE IN NACL 20-0.9 MG/50ML-% IV SOLN
20.0000 mg | Freq: Two times a day (BID) | INTRAVENOUS | Status: AC
  Administered 2019-05-21 – 2019-05-22 (×2): 20 mg via INTRAVENOUS
  Filled 2019-05-21: qty 50

## 2019-05-21 MED ORDER — VANCOMYCIN HCL IN DEXTROSE 1-5 GM/200ML-% IV SOLN
1000.0000 mg | Freq: Once | INTRAVENOUS | Status: AC
  Administered 2019-05-21: 1000 mg via INTRAVENOUS
  Filled 2019-05-21: qty 200

## 2019-05-21 MED ORDER — PROTAMINE SULFATE 10 MG/ML IV SOLN
INTRAVENOUS | Status: DC | PRN
  Administered 2019-05-21: 20 mg via INTRAVENOUS
  Administered 2019-05-21: 280 mg via INTRAVENOUS

## 2019-05-21 MED ORDER — ALBUMIN HUMAN 5 % IV SOLN: INTRAVENOUS | Status: DC | PRN

## 2019-05-21 MED ORDER — LIDOCAINE 2% (20 MG/ML) 5 ML SYRINGE
INTRAMUSCULAR | Status: AC
  Filled 2019-05-21: qty 5

## 2019-05-21 MED ORDER — PHENYLEPHRINE HCL-NACL 20-0.9 MG/250ML-% IV SOLN: 0.0000 ug/min | INTRAVENOUS | Status: DC

## 2019-05-21 SURGICAL SUPPLY — 145 items
ADAPTER CARDIO PERF ANTE/RETRO (ADAPTER) ×4 IMPLANT
ADH SKN CLS APL DERMABOND .7 (GAUZE/BANDAGES/DRESSINGS) ×3
ADH SKN CLS LQ APL DERMABOND (GAUZE/BANDAGES/DRESSINGS) ×3
ADPR PRFSN 84XANTGRD RTRGD (ADAPTER) ×3
APL SKNCLS STERI-STRIP NONHPOA (GAUZE/BANDAGES/DRESSINGS) ×3
ARTICLIP LAA PROCLIP II 45 (Clip) ×4 IMPLANT
BAG DECANTER FOR FLEXI CONT (MISCELLANEOUS) ×12 IMPLANT
BAND INSRT 18 STRL LF DISP RB (MISCELLANEOUS) ×3
BAND RUBBER #18 3X1/16 STRL (MISCELLANEOUS) ×2 IMPLANT
BENZOIN TINCTURE PRP APPL 2/3 (GAUZE/BANDAGES/DRESSINGS) ×4 IMPLANT
BLADE CLIPPER SURG (BLADE) ×12 IMPLANT
BLADE STERNUM SYSTEM 6 (BLADE) IMPLANT
BLADE SURG 11 STRL SS (BLADE) ×7 IMPLANT
BLADE SURG 15 STRL LF DISP TIS (BLADE) ×4 IMPLANT
BLADE SURG 15 STRL SS (BLADE) ×8
CABLE PACING FASLOC BIEGE (MISCELLANEOUS) ×2 IMPLANT
CANISTER SUCT 3000ML PPV (MISCELLANEOUS) ×16 IMPLANT
CANNULA FEM VENOUS REMOTE 22FR (CANNULA) ×1 IMPLANT
CANNULA FEMORAL ART 14 SM (MISCELLANEOUS) ×4 IMPLANT
CANNULA GUNDRY RCSP 15FR (MISCELLANEOUS) ×4 IMPLANT
CANNULA OPTISITE PERFUSION 16F (CANNULA) IMPLANT
CANNULA OPTISITE PERFUSION 18F (CANNULA) ×4 IMPLANT
CANNULA SUMP PERICARDIAL (CANNULA) ×8 IMPLANT
CATH CPB KIT OWEN (MISCELLANEOUS) IMPLANT
CATH KIT ON-Q SILVERSOAK 5 (CATHETERS) IMPLANT
CATH KIT ON-Q SILVERSOAK 5IN (CATHETERS) IMPLANT
CELLS DAT CNTRL 66122 CELL SVR (MISCELLANEOUS) ×3 IMPLANT
CNTNR URN SCR LID CUP LEK RST (MISCELLANEOUS) ×9 IMPLANT
CONN ST 1/4X3/8  BEN (MISCELLANEOUS) ×3
CONN ST 1/4X3/8 BEN (MISCELLANEOUS) ×8 IMPLANT
CONNECTOR 1/2X3/8X1/2 3 WAY (MISCELLANEOUS) ×1
CONNECTOR 1/2X3/8X1/2 3WAY (MISCELLANEOUS) ×3 IMPLANT
CONT SPEC 4OZ STRL OR WHT (MISCELLANEOUS) ×12
COVER BACK TABLE 24X17X13 BIG (DRAPES) ×4 IMPLANT
COVER MAYO STAND STRL (DRAPES) ×4 IMPLANT
COVER PROBE W GEL 5X96 (DRAPES) ×4 IMPLANT
COVER SURGICAL LIGHT HANDLE (MISCELLANEOUS) ×4 IMPLANT
DERMABOND ADHESIVE PROPEN (GAUZE/BANDAGES/DRESSINGS) ×1
DERMABOND ADVANCED (GAUZE/BANDAGES/DRESSINGS) ×1
DERMABOND ADVANCED .7 DNX12 (GAUZE/BANDAGES/DRESSINGS) ×9 IMPLANT
DERMABOND ADVANCED .7 DNX6 (GAUZE/BANDAGES/DRESSINGS) ×1 IMPLANT
DEVICE ATRICLIP LAA PRCLPII 45 (Clip) IMPLANT
DEVICE CLOSURE PERCLS PRGLD 6F (VASCULAR PRODUCTS) IMPLANT
DEVICE SUT CK QUICK LOAD INDV (Prosthesis & Implant Heart) ×30 IMPLANT
DEVICE SUT CK QUICK LOAD MINI (Prosthesis & Implant Heart) ×8 IMPLANT
DEVICE TROCAR PUNCTURE CLOSURE (ENDOMECHANICALS) ×8 IMPLANT
DRAIN CHANNEL 28F RND 3/8 FF (WOUND CARE) ×16 IMPLANT
DRAPE C-ARM 35X43 STRL (DRAPES) IMPLANT
DRAPE C-ARM 42X72 X-RAY (DRAPES) ×8 IMPLANT
DRAPE CV SPLIT W-CLR ANES SCRN (DRAPES) ×8 IMPLANT
DRAPE INCISE IOBAN 66X45 STRL (DRAPES) ×22 IMPLANT
DRAPE PERI GROIN 82X75IN TIB (DRAPES) ×8 IMPLANT
DRAPE SLUSH/WARMER DISC (DRAPES) ×8 IMPLANT
DRSG AQUACEL AG ADV 3.5X10 (GAUZE/BANDAGES/DRESSINGS) ×1 IMPLANT
DRSG COVADERM 4X8 (GAUZE/BANDAGES/DRESSINGS) ×8 IMPLANT
ELECT BLADE 6.5 EXT (BLADE) ×8 IMPLANT
ELECT REM PT RETURN 9FT ADLT (ELECTROSURGICAL) ×16
ELECTRODE REM PT RTRN 9FT ADLT (ELECTROSURGICAL) ×12 IMPLANT
FELT TEFLON 1X6 (MISCELLANEOUS) ×10 IMPLANT
FEMORAL VENOUS CANN RAP (CANNULA) IMPLANT
GAUZE SPONGE 4X4 12PLY STRL (GAUZE/BANDAGES/DRESSINGS) ×4 IMPLANT
GAUZE SPONGE 4X4 12PLY STRL LF (GAUZE/BANDAGES/DRESSINGS) ×4 IMPLANT
GLOVE BIO SURGEON STRL SZ 6 (GLOVE) ×1 IMPLANT
GLOVE BIO SURGEON STRL SZ 6.5 (GLOVE) ×4 IMPLANT
GLOVE BIOGEL PI IND STRL 6 (GLOVE) IMPLANT
GLOVE BIOGEL PI IND STRL 6.5 (GLOVE) ×1 IMPLANT
GLOVE BIOGEL PI IND STRL 8.5 (GLOVE) ×1 IMPLANT
GLOVE BIOGEL PI INDICATOR 6 (GLOVE) ×1
GLOVE BIOGEL PI INDICATOR 6.5 (GLOVE) ×1
GLOVE BIOGEL PI INDICATOR 8.5 (GLOVE) ×1
GLOVE ORTHO TXT STRL SZ7.5 (GLOVE) ×24 IMPLANT
GLOVE SURG SS PI 6.0 STRL IVOR (GLOVE) ×1 IMPLANT
GOWN STRL REUS W/ TWL LRG LVL3 (GOWN DISPOSABLE) ×24 IMPLANT
GOWN STRL REUS W/TWL LRG LVL3 (GOWN DISPOSABLE) ×48
GRASPER SUT TROCAR 14GX15 (MISCELLANEOUS) ×4 IMPLANT
IV NS IRRIG 3000ML ARTHROMATIC (IV SOLUTION) ×2 IMPLANT
KIT BASIN OR (CUSTOM PROCEDURE TRAY) ×8 IMPLANT
KIT DILATOR VASC 18G NDL (KITS) ×8 IMPLANT
KIT DRAINAGE VACCUM ASSIST (KITS) ×3 IMPLANT
KIT SUCTION CATH 14FR (SUCTIONS) ×8 IMPLANT
KIT SUT CK MINI COMBO 4X17 (Prosthesis & Implant Heart) ×2 IMPLANT
KIT TURNOVER KIT B (KITS) ×8 IMPLANT
LEAD PACING MYOCARDI (MISCELLANEOUS) ×4 IMPLANT
LINE VENT (MISCELLANEOUS) ×2 IMPLANT
NDL AORTIC ROOT 14G 7F (CATHETERS) ×1 IMPLANT
NDL SUT 1 .5 CRC FRENCH EYE (NEEDLE) ×2 IMPLANT
NEEDLE AORTIC ROOT 14G 7F (CATHETERS) ×8 IMPLANT
NEEDLE FRENCH EYE (NEEDLE) ×4
NS IRRIG 1000ML POUR BTL (IV SOLUTION) ×17 IMPLANT
PACK E MIN INVASIVE VALVE (SUTURE) ×4 IMPLANT
PACK OPEN HEART (CUSTOM PROCEDURE TRAY) ×8 IMPLANT
PAD ARMBOARD 7.5X6 YLW CONV (MISCELLANEOUS) ×16 IMPLANT
PAD ELECT DEFIB RADIOL ZOLL (MISCELLANEOUS) ×8 IMPLANT
PERCLOSE PROGLIDE 6F (VASCULAR PRODUCTS) ×16
POSITIONER HEAD DONUT 9IN (MISCELLANEOUS) ×8 IMPLANT
RETRACTOR WND ALEXIS 18 MED (MISCELLANEOUS) ×3 IMPLANT
RING MITRAL MEMO 4D 34 (Prosthesis & Implant Heart) ×1 IMPLANT
RING MITRAL MEMO 4D 38 (Prosthesis & Implant Heart) ×2 IMPLANT
RTRCTR WOUND ALEXIS 18CM MED (MISCELLANEOUS) ×4
SET CANNULATION TOURNIQUET (MISCELLANEOUS) ×4 IMPLANT
SET CARDIOPLEGIA MPS 5001102 (MISCELLANEOUS) ×2 IMPLANT
SET IRRIG TUBING LAPAROSCOPIC (IRRIGATION / IRRIGATOR) ×4 IMPLANT
SHEATH PINNACLE 8F 10CM (SHEATH) ×4 IMPLANT
SOL ANTI FOG 6CC (MISCELLANEOUS) ×6 IMPLANT
SOLUTION ANTI FOG 6CC (MISCELLANEOUS) ×2
SUT BONE WAX W31G (SUTURE) ×8 IMPLANT
SUT ETHIBOND (SUTURE) ×8 IMPLANT
SUT ETHIBOND 2 0 SH (SUTURE) ×2 IMPLANT
SUT ETHIBOND 2-0 RB-1 WHT (SUTURE) ×4 IMPLANT
SUT ETHIBOND X763 2 0 SH 1 (SUTURE) ×7 IMPLANT
SUT GORETEX CV 4 TH 22 36 (SUTURE) ×4 IMPLANT
SUT GORETEX CV-5THC-13 36IN (SUTURE) ×20 IMPLANT
SUT GORETEX CV4 TH-18 (SUTURE) ×8 IMPLANT
SUT MNCRL AB 4-0 PS2 18 (SUTURE) ×4 IMPLANT
SUT PROLENE 3 0 SH DA (SUTURE) ×4 IMPLANT
SUT PROLENE 3 0 SH1 36 (SUTURE) ×36 IMPLANT
SUT PROLENE 4 0 RB 1 (SUTURE) ×32
SUT PROLENE 4-0 RB1 .5 CRCL 36 (SUTURE) ×8 IMPLANT
SUT PTFE CHORD X 16MM (SUTURE) ×3 IMPLANT
SUT SILK  1 MH (SUTURE) ×5
SUT SILK 1 MH (SUTURE) ×6 IMPLANT
SUT SILK 2 0 SH CR/8 (SUTURE) IMPLANT
SUT SILK 2 0SH CR/8 30 (SUTURE) ×4 IMPLANT
SUT SILK 3 0 SH CR/8 (SUTURE) IMPLANT
SUT SILK 3 0SH CR/8 30 (SUTURE) ×4 IMPLANT
SUT VIC AB 2-0 CTX 27 (SUTURE) ×8 IMPLANT
SUT VIC AB 2-0 CTX 36 (SUTURE) IMPLANT
SUT VIC AB 2-0 UR6 27 (SUTURE) ×8 IMPLANT
SUT VIC AB 3-0 SH 8-18 (SUTURE) ×8 IMPLANT
SUT VICRYL 2 TP 1 (SUTURE) ×4 IMPLANT
SYR 10ML LL (SYRINGE) ×5 IMPLANT
SYSTEM SAHARA CHEST DRAIN ATS (WOUND CARE) ×12 IMPLANT
TAPE CLOTH SURG 4X10 WHT LF (GAUZE/BANDAGES/DRESSINGS) ×2 IMPLANT
TAPE PAPER 2X10 WHT MICROPORE (GAUZE/BANDAGES/DRESSINGS) ×2 IMPLANT
TOWEL GREEN STERILE (TOWEL DISPOSABLE) ×8 IMPLANT
TOWEL GREEN STERILE FF (TOWEL DISPOSABLE) ×8 IMPLANT
TRAY FOLEY SLVR 14FR TEMP STAT (SET/KITS/TRAYS/PACK) ×4 IMPLANT
TRAY FOLEY SLVR 16FR TEMP STAT (SET/KITS/TRAYS/PACK) ×4 IMPLANT
TROCAR XCEL BLADELESS 5X75MML (TROCAR) ×12 IMPLANT
TROCAR XCEL NON-BLD 11X100MML (ENDOMECHANICALS) ×8 IMPLANT
TUBE SUCT INTRACARD DLP 20F (MISCELLANEOUS) ×4 IMPLANT
TUNNELER SHEATH ON-Q 11GX8 DSP (PAIN MANAGEMENT) IMPLANT
UNDERPAD 30X30 (UNDERPADS AND DIAPERS) ×8 IMPLANT
WATER STERILE IRR 1000ML POUR (IV SOLUTION) ×16 IMPLANT
WIRE EMERALD 3MM-J .035X150CM (WIRE) ×4 IMPLANT

## 2019-05-21 NOTE — Progress Notes (Signed)
Pt came back from OR extubated and on 6L simple mask with sats of 98%.  RT will continue to monitor.

## 2019-05-21 NOTE — OR Nursing (Signed)
15:08pm - Per Dr. Roxy Manns, called volunteer desk and updated wife at this time.

## 2019-05-21 NOTE — Anesthesia Preprocedure Evaluation (Signed)
Anesthesia Evaluation  Patient identified by MRN, date of birth, ID band Patient awake    Reviewed: Allergy & Precautions, H&P , NPO status , Patient's Chart, lab work & pertinent test results  Airway Mallampati: II   Neck ROM: full    Dental   Pulmonary asthma ,    breath sounds clear to auscultation       Cardiovascular + Valvular Problems/Murmurs MR  Rhythm:regular Rate:Normal  Normal LV function   Neuro/Psych    GI/Hepatic GERD  ,  Endo/Other    Renal/GU      Musculoskeletal   Abdominal   Peds  Hematology   Anesthesia Other Findings   Reproductive/Obstetrics                             Anesthesia Physical Anesthesia Plan  ASA: III  Anesthesia Plan: General   Post-op Pain Management:    Induction: Intravenous  PONV Risk Score and Plan: 2 and Ondansetron, Dexamethasone, Midazolam and Treatment may vary due to age or medical condition  Airway Management Planned: Oral ETT and Double Lumen EBT  Additional Equipment: Arterial line, CVP, PA Cath, 3D TEE, Ultrasound Guidance Line Placement and TEE  Intra-op Plan:   Post-operative Plan: Post-operative intubation/ventilation  Informed Consent: I have reviewed the patients History and Physical, chart, labs and discussed the procedure including the risks, benefits and alternatives for the proposed anesthesia with the patient or authorized representative who has indicated his/her understanding and acceptance.       Plan Discussed with: CRNA, Anesthesiologist and Surgeon  Anesthesia Plan Comments:         Anesthesia Quick Evaluation

## 2019-05-21 NOTE — Plan of Care (Signed)

## 2019-05-21 NOTE — Progress Notes (Deleted)
  Echocardiogram 2D Echocardiogram has been performed.  [name] 05/21/2019, 8:49 AM

## 2019-05-21 NOTE — Anesthesia Procedure Notes (Signed)
Central Venous Catheter Insertion Performed by: Albertha Ghee, MD, anesthesiologist Start/End2/18/2021 6:45 AM, 05/21/2019 6:57 AM Patient location: Pre-op. Preanesthetic checklist: patient identified, IV checked, site marked, risks and benefits discussed, surgical consent, monitors and equipment checked, pre-op evaluation, timeout performed and anesthesia consent Position: Trendelenburg Lidocaine 1% used for infiltration and patient sedated Hand hygiene performed , maximum sterile barriers used  and Seldinger technique used Catheter size: 8.5 Fr Central line and PA cath was placed.Sheath introducer Swan type:thermodilation Procedure performed using ultrasound guided technique. Ultrasound Notes:anatomy identified, needle tip was noted to be adjacent to the nerve/plexus identified, no ultrasound evidence of intravascular and/or intraneural injection and image(s) printed for medical record Attempts: 1 Following insertion, line sutured, dressing applied and Biopatch. Post procedure assessment: blood return through all ports, free fluid flow and no air  Patient tolerated the procedure well with no immediate complications.

## 2019-05-21 NOTE — Interval H&P Note (Signed)
History and Physical Interval Note:  05/21/2019 6:14 AM  [name], MD  has presented today for surgery, with the diagnosis of MR.  The various methods of treatment have been discussed with the patient and family. After consideration of risks, benefits and other options for treatment, the patient has consented to  Procedure(s): MINIMALLY INVASIVE MITRAL VALVE REPAIR (MVR) (Right) CLIPPING OF ATRIAL APPENDAGE (N/A) TRANSESOPHAGEAL ECHOCARDIOGRAM (TEE) (N/A) as a surgical intervention.  The patient's history has been reviewed, patient examined, no change in status, stable for surgery.  I have reviewed the patient's chart and labs.  Questions were answered to the patient's satisfaction.     Rexene Alberts

## 2019-05-21 NOTE — Progress Notes (Signed)
  Echocardiogram Echocardiogram Transesophageal has been performed.  [name] 05/21/2019, 9:00 AM

## 2019-05-21 NOTE — Progress Notes (Signed)
TCTS Evening rounds DOS s/p MVr Extubated Minimal CT output A-V paced  Continue early, routine postoperative care. Ahmed Prima Z. Orvan Seen, [place]

## 2019-05-21 NOTE — Transfer of Care (Signed)
Immediate Anesthesia Transfer of Care Note  Patient: Ryan Carbon, MD  Procedure(s) Performed: MINIMALLY INVASIVE MITRAL VALVE REPAIR (MVR) USING MEMO 4D MITRAL RING SIZE 38MM (Right Chest) CLIPPING OF ATRIAL APPENDAGE USING ATRICURE CLIP SIZE 45MM (N/A Chest) TRANSESOPHAGEAL ECHOCARDIOGRAM (TEE) (N/A )  Patient Location: ICU  Anesthesia Type:General  Level of Consciousness: drowsy  Airway & Oxygen Therapy: Patient Spontanous Breathing and Patient connected to face mask oxygen  Post-op Assessment: Report given to RN and Post -op Vital signs reviewed and stable  Post vital signs: Reviewed and stable  Last Vitals:  Vitals Value Taken Time  BP    Temp 37.2 C 05/21/19 1645  Pulse 80 05/21/19 1642  Resp 16 05/21/19 1645  SpO2 99 % 05/21/19 1642  Vitals shown include unvalidated device data.  Last Pain:  Vitals:   05/21/19 0610  TempSrc:   PainSc: 0-No pain      Patients Stated Pain Goal: 3 ([id number])  Complications: No apparent anesthesia complications

## 2019-05-21 NOTE — Anesthesia Procedure Notes (Signed)
Arterial Line Insertion Performed by: Milford Cage, CRNA, CRNA  Patient location: Pre-op. Preanesthetic checklist: patient identified, IV checked, site marked, risks and benefits discussed, surgical consent, monitors and equipment checked, pre-op evaluation, timeout performed and anesthesia consent Lidocaine 1% used for infiltration Left, radial was placed Catheter size: 20 G Hand hygiene performed  and Seldinger technique used  Attempts: 1 Procedure performed without using ultrasound guided technique. Following insertion, dressing applied and Biopatch. Post procedure assessment: normal and unchanged  Patient tolerated the procedure well with no immediate complications.

## 2019-05-21 NOTE — Anesthesia Procedure Notes (Signed)
Procedure Name: Intubation Performed by: Milford Cage, CRNA Pre-anesthesia Checklist: Patient identified, Emergency Drugs available, Suction available and Patient being monitored Patient Re-evaluated:Patient Re-evaluated prior to induction Oxygen Delivery Method: Circle System Utilized Preoxygenation: Pre-oxygenation with 100% oxygen Induction Type: IV induction Ventilation: Mask ventilation without difficulty Laryngoscope Size: Glidescope and 3 Grade View: Grade I Endobronchial tube: Left, Double lumen EBT, EBT position confirmed by auscultation and EBT position confirmed by fiberoptic bronchoscope and 39 Fr Number of attempts: 2 Airway Equipment and Method: Stylet Placement Confirmation: ETT inserted through vocal cords under direct vision,  positive ETCO2 and breath sounds checked- equal and bilateral Secured at: 31 cm Tube secured with: Tape Dental Injury: Teeth and Oropharynx as per pre-operative assessment

## 2019-05-21 NOTE — Brief Op Note (Signed)
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  PRE-OPERATIVE DIAGNOSIS:  Mitral Regurgitation  POST-OPERATIVE DIAGNOSIS:  Mitral Regurgitation  PROCEDURE:  Procedure(s): MINIMALLY INVASIVE MITRAL VALVE REPAIR (MVR) USING MEMO 4D MITRAL RING SIZE 34MM (Right) CLIPPING OF ATRIAL APPENDAGE USING ATRICURE CLIP SIZE 45MM (N/A) TRANSESOPHAGEAL ECHOCARDIOGRAM (TEE) (N/A)  SURGEON:  Surgeon(s) and Role:    Rexene Alberts, MD - Primary  PHYSICIAN ASSISTANT:   Nicholes Rough, PA-C    ANESTHESIA:   general  EBL:  800 mL   BLOOD ADMINISTERED:none  DRAINS: ROUTINE   LOCAL MEDICATIONS USED:  NONE  SPECIMEN:  No Specimen  DISPOSITION OF SPECIMEN:  N/A  COUNTS:  YES  DICTATION: .Dragon Dictation  PLAN OF CARE: Admit to inpatient   PATIENT DISPOSITION:  ICU - intubated and hemodynamically stable.   Delay start of Pharmacological VTE agent (>24hrs) due to surgical blood loss or risk of bleeding: yes

## 2019-05-21 NOTE — Op Note (Addendum)
CARDIOTHORACIC SURGERY OPERATIVE NOTE  Date of Procedure:  05/21/2019  Preoperative Diagnosis: Severe Mitral Regurgitation  Postoperative Diagnosis: Same  Procedure:    Minimally-Invasive Mitral Valve Repair  Complex valvuloplasty including triangular resection of flail P1 segment of posterior leaflet  Artificial Gore-tex neochord placement x6  Suture plication of P1 and P2 segments of posterior leaflet  Sorin Memo 4D Ring Annuloplasty (size 22mm, catalog # [id number], serial # [id number])  Clipping of left atrial appendage (Atricure Pro245 left atrial clip, size 45 mm)    Surgeon: Valentina Gu. 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  Severely enlarged flail P1 segment of posterior leaflet with multiple ruptured primary chordae tendinae  Type II dysfunction with severe mitral regurgitation  Mild central aortic insufficiency  Normal left ventricular systolic function  Systolic anterior motion with persistent MR after initial attempt at repair requiring second bypass run for modification of repair  No residual mitral regurgitation after successful valve repair                       BRIEF CLINICAL NOTE AND INDICATIONS FOR SURGERY  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  Patient states that he has known of a presence of a heart murmur since 1991 when it was first noted on routine physical exam.  Echocardiogram performed at that time revealed mitral valve prolapse with mild mitral regurgitation.  He has had regular follow-up echocardiograms performed intermittently ever since, and recent follow-up echocardiograms have documented progressive enlargement of the left atrium but normal left ventricular  size and systolic function.  The patient was seen in consultation recently by Dr. Burt Knack and subsequently scheduled for transesophageal echocardiogram and diagnostic cardiac catheterization which was performed earlier today.  TEE confirmed the presence of mitral valve prolapse with severe prolapse involving a portion of the posterior leaflet with ruptured primary chordae tendinae.  Function remain normal.  Diagnostic cardiac catheterization was notable for the absence of significant coronary artery disease and revealed normal right heart pressures.  Cardiothoracic surgical consultation was requested.  The patient has been seen in consultation and counseled at length regarding the indications, risks and potential benefits of surgery.  All questions have been answered, and the patient provides full informed consent for the operation as described.    DETAILS OF THE OPERATIVE PROCEDURE  Preparation:  The patient is brought to the operating room on the above mentioned date and central monitoring was established by the anesthesia team including placement of Swan-Ganz catheter through the left internal jugular vein.  A radial arterial line is placed. The patient is placed in the supine position on the operating table.  Intravenous antibiotics are administered. General endotracheal anesthesia is induced uneventfully. The patient is initially intubated using a right-sided dual lumen endotracheal tube.  Once this was recognized the patient was reintubated using a left-sided dual-lumen endotracheal tube.  A Foley catheter is placed.  Baseline transesophageal echocardiogram was performed.  Findings were notable for severe prolapse with flail segment of the posterior leaflet causing severe mitral regurgitation.  The jet of regurgitation was eccentric and difficult to quantify.  The left atrium was moderately dilated.  There was normal left ventricular size and systolic function.  There was mild central aortic  insufficiency.  No other abnormalities were noted.  A soft roll is placed behind the patient's left scapula and the neck gently extended and turned to the left.   The patient's right neck, chest, abdomen, both groins, and both lower extremities are prepared and draped in a sterile manner. A time out procedure is performed.   Percutaneous Vascular Access:  Percutaneous arterial and venous access were obtained on the right side.  Using ultrasound guidance the right common femoral vein was cannulated using the Seldinger technique a pair of Perclose vascular closure devises were placed at opposing 30 degree angles in the femoral vein, after which time an 8 French sheath inserted.  The right common femoral artery was cannulated using a micropuncture wire and sheath.  A pair of Perclose vascular closure devices were placed at opposing 30 degree angles in the femoral artery, and a 8 French sheath inserted.  The right internal jugular vein was cannulated  using ultrasound guidance and an 8 French sheath inserted.     Surgical Approach:  A right miniature anterolateral thoracotomy incision is performed. The incision is placed just lateral to and superior to the right nipple. The pectoralis major muscle is retracted medially and completely preserved. The right pleural space is entered through the 3rd intercostal space.   Upon entry into the right pleural space it becomes apparent that the dual lumen endotracheal tube is not functioning appropriately and the right lung will not collapse.  After troubleshooting by Dr. Marcie Bal it becomes apparent that the tube was inadvertently placed in the right mainstem bronchus.  The tube was repositioned by Dr. Marcie Bal with bronchoscopic guidance until the bronchial lumen is appropriately positioned in the left mainstem bronchus.  A soft tissue retractor is placed.  Two 11 mm ports are placed through separate stab incisions inferiorly. The right pleural space is  insufflated continuously with carbon dioxide gas through the posterior port during the remainder of the operation.  A pledgeted sutures placed through the dome of the right hemidiaphragm and retracted inferiorly to facilitate exposure.  A longitudinal incision is made in the pericardium 3 cm anterior to the phrenic nerve and silk traction sutures are placed on either side of the incision for exposure.   Extracorporeal Cardiopulmonary Bypass, Myocardial Protection, and Clipping of Left Atrial Appendage:   The patient was heparinized systemically.  The right common femoral vein is cannulated through the venous sheath and a guidewire advanced into the right atrium using TEE guidance.  The femoral vein cannulated using a 22 Fr long femoral venous cannula.  The right common femoral artery is cannulated through the arterial sheath and a guidewire advanced into the descending thoracic aorta using TEE guidance.  Femoral artery is cannulated with a 18 French femoral arterial cannula.  The right internal jugular vein is cannulated through the venous sheath and a guidewire advanced into the right atrium.  The internal jugular vein is cannulated using a 14 French pediatric femoral venous cannula.   Adequate heparinization is verified.   The entire pre-bypass portion of the operation was notable for stable hemodynamics.  Cardiopulmonary bypass was begun.  Vacuum assist venous drainage is utilized. The incision in the pericardium is extended in both directions. Venous drainage and exposure are notably excellent.   The left atrial appendage is obliterated using an Atricure left atrial clip (Pro245, size 45 mm).  The clip is applied under direct thoracoscopic vision through the transverse sinus posterior to the aorta.  After the clip is applied obliteration of the appendage is verified using transesophageal echocardiography.  A retrograde cardioplegia cannula is placed through the right atrium into the coronary sinus  using transesophageal echocardiogram guidance.  An antegrade cardioplegia cannula is placed in the ascending aorta.    The patient is cooled to 32C systemic temperature.  The aortic cross clamp is applied and cardioplegia is delivered initially in an antegrade fashion through the aortic root using modified [place] cold blood cardioplegia ([facility] blood cardioplegia protocol).   The initial cardioplegic arrest is rapid with early diastolic arrest.   Myocardial protection was felt to be excellent.   Mitral Valve Repair:  A left atriotomy incision was performed through the interatrial groove and extended partially across the back wall of the left atrium after opening the oblique sinus inferiorly.  The mitral valve is exposed using a self-retaining retractor.  The mitral valve was inspected and notable for forme fruste variant of Barlow's type myxomatous degenerative disease.  There is excessive leaflet tissue particularly involving an extremely large P1 segment of the posterior leaflet which is notably flail with multiple ruptured primary chordae tendinae.  There is also billowing of the P2 segment.  The anterior leaflet was essentially normal such that valve morphology falls on a spectrum somewhere in between fibroelastic deficiency type and true Barlow's disease.  Interrupted 2-0 Ethibond horizontal mattress sutures are placed circumferentially around the entire mitral valve annulus. The sutures will ultimately be utilized for ring annuloplasty, and at this juncture there are utilized to suspend the valve symmetrically.  The severely prolapsed and enlarged P1 segment is repaired using a combination of generous triangular leaflet resection and artificial Gore-Tex neochords.  Initially a triangular resection is performed.  The intervening vertical defect is subsequently closed using interrupted simple everting CV 5 Gore-Tex suture.  Artificial neochord placement was performed using Chord-X  multi-strand CV-4 Goretex pre-measured loops.  The appropriate cord length was measured from corresponding normal length primary cords from the P3 segment of the posterior leaflet.  The chordae tendinae of the P3 segment measures approximately 20 mm in length and 51mm premeasured loops are chosen to shorten the height of P1 further.  The papillary muscle suture of the Chord-X multi-strand suture was placed through the head of the anterior papillary muscle in a horizontal mattress fashion and tied over Teflon felt pledgets. Each of the three pre-measured loops were then reimplanted into the free margin of the P1 segment of the posterior leaflet.    The valve was tested with saline and appeared competent even without ring annuloplasty complete.  However there remains some billowing of P1.  To correct this everting suture plication with 4-0 Prolene sutures performed in the cleft between P1 and P2 as well as between P1 and the commissural leaflet.  The valve was sized to a 34 mm annuloplasty ring, based upon the transverse distance between the left and right commissures and the height of the anterior leaflet, corresponding to a size just slightly larger than the overall surface area of the anterior leaflet.  A Sorin Memo 4D annuloplasty ring (size 92mm, catalog #[id number], serial [id number]) was secured in place uneventfully. All ring sutures were secured using a Cor-knot device.    The valve was tested with saline and appeared competent. There is no residual leak. There was a broad, symmetrical line of coaptation of the anterior and posterior leaflet which was confirmed using the blue ink test.  Rewarming is begun.  The atriotomy was closed using a 2-layer closure of running 3-0 Prolene suture after placing a sump drain across the  mitral valve to serve as a left ventricular vent.  One final dose of warm retrograde "reanimation dose" cardioplegia was administered retrograde through the coronary sinus catheter while  all air was evacuated through the aortic root.  The aortic cross clamp was removed after a cross clamp time of 120 minutes.  Epicardial pacing wires are fixed to the inferior wall of the right ventricule and to the right atrial appendage. The patient is rewarmed to 37C temperature. The left ventricular vent and antegrade cardioplegia cannula are removed. The patient is weaned and disconnected from cardiopulmonary bypass.  The patient's rhythm at separation from bypass was junctional rhythm so AV pacing was employed.  The patient was weaned from bypass without any inotropic support.   Although initially images from transesophageal echocardiogram revealed intact mitral valve repair with no residual mitral regurgitation, the patient quickly developed systolic anterior motion of the mitral valve.  AV pacing was discontinued and the patient's ventricle failed with extra volume.  Despite these maneuvers systolic anterior motion persists and recurrent mitral regurgitation gets worse.  Inspection of the repair suggests that the annuloplasty ring is undersized.  Cardiopulmonary bypass is resumed.  An antegrade cardioplegia cannula is replaced in the aorta.  The aortic cross-clamp is placed and a full arresting dose of cold blood cardioplegia administered through the aortic root.  The left atriotomy incision is reopened.  The mitral valve was exposed using a self-retaining retractor.  Inspection of the valve repair reveals no changes from its previous appearance.  After careful reexamination decision is made to proceed with removal of the annuloplasty ring to upsize the ring.  The annuloplasty ring is removed uneventfully.  Saline testing after the ring has been removed demonstrates reasonably good appearance of the repair of the overall height of P2 and P1 remain borderline to large.  Repeat sizing suggests using an annuloplasty ring at least 1 or 2 sizes larger than the previous ring.  Interrupted 2-0 Ethibond  sutures are placed circumferentially around the mitral annulus for ring annuloplasty.  During placement of the ring sutures P1 and P2 segments are further shortened by using horizontal mattress sutures rather than simple ring sutures placed to shorten and plicate both P1 and P2.  A Sorin Memo 4D annuloplasty ring (size 34mm, catalog #[id number], serial [id number]) was secured in place uneventfully. All ring sutures were secured using a Cor-knot device.  The valve is again saline tested and appears perfectly competent.  Rewarming is begun.   Procedure Completion:  The atriotomy was closed using a 2-layer closure of running 3-0 Prolene suture after placing a sump drain across the mitral valve to serve as a left ventricular vent.  One final dose of warm retrograde "reanimation dose" cardioplegia was administered retrograde through the aortic root.  The aortic cross clamp was removed after a second cross clamp time of 86 minutes.  The patient is rewarmed to 37C temperature. The left ventricular vent and antegrade cardioplegia cannula are removed. The patient is weaned and disconnected from cardiopulmonary bypass.  The patient's rhythm at separation from bypass was AV paced.  The patient was weaned from bypass without any inotropic support. Total cardiopulmonary bypass time for the entire operation was 290 minutes.  Followup transesophageal echocardiogram performed after separation from bypass revealed a well-seated annuloplasty ring in the mitral position with a normal functioning mitral valve. There was no residual leak and no residual systolic anterior motion of the mitral valve.  Left ventricular function was unchanged from preoperatively.  The mean gradient  across the mitral valve was estimated to be 3 mmHg.  The femoral arterial and venous cannulas were removed and all Perclose sutures secured.  Manual pressure was maintained while Protamine was administered.  The right internal jugular cannula was removed and  manual pressure held on the neck and groin for 15 minutes.  Single lung ventilation was begun. The atriotomy closure was inspected for hemostasis. The pericardial sac was drained using a 28 French Bard drain placed through the anterior port incision.  The right pleural space is irrigated with saline solution and inspected for hemostasis.   Postoperative analgesia was facilitated using an intercostal nerve block performed using a total of 50 mL Exparel long-acting bupivacaine injected under direct vision into the intercostal bundles.  Alongside the third fourth and fifth ribs posteriorly.  The right pleural space was drained using a 28 French Bard drain placed through the posterior port incision. The miniature thoracotomy incision was closed in multiple layers in routine fashion. The right groin incision was inspected for hemostasis and closed in multiple layers in routine fashion.  The post-bypass portion of the operation was notable for stable rhythm and hemodynamics.  No blood products were administered during the operation.   Disposition:  The patient tolerated the procedure well.  The patient was extubated in the operating room and subsequently transported to the surgical intensive care unit in stable condition. There were no intraoperative complications. All sponge instrument and needle counts are verified correct at completion of the operation.     Valentina Gu. Roxy Manns MD 05/21/2019 3:59 PM

## 2019-05-22 ENCOUNTER — Inpatient Hospital Stay (HOSPITAL_COMMUNITY): Payer: 59

## 2019-05-22 ENCOUNTER — Encounter (HOSPITAL_COMMUNITY): Payer: Self-pay | Admitting: Thoracic Surgery (Cardiothoracic Vascular Surgery)

## 2019-05-22 LAB — POCT I-STAT 7, (LYTES, BLD GAS, ICA,H+H)
Acid-Base Excess: 1 mmol/L (ref 0.0–2.0)
Acid-base deficit: 1 mmol/L (ref 0.0–2.0)
Acid-base deficit: 5 mmol/L — ABNORMAL HIGH (ref 0.0–2.0)
Bicarbonate: 26.8 mmol/L (ref 20.0–28.0)
Bicarbonate: 26.6 mmol/L (ref 20.0–28.0)
Bicarbonate: 24.6 mmol/L (ref 20.0–28.0)
Bicarbonate: 21.1 mmol/L (ref 20.0–28.0)
Calcium, Ion: 1.29 mmol/L (ref 1.15–1.40)
Calcium, Ion: 1.06 mmol/L — ABNORMAL LOW (ref 1.15–1.40)
Calcium, Ion: 1.05 mmol/L — ABNORMAL LOW (ref 1.15–1.40)
Calcium, Ion: 1.15 mmol/L (ref 1.15–1.40)
HCT: 41 % (ref 39.0–52.0)
HCT: 35 % — ABNORMAL LOW (ref 39.0–52.0)
HCT: 30 % — ABNORMAL LOW (ref 39.0–52.0)
HCT: 31 % — ABNORMAL LOW (ref 39.0–52.0)
Hemoglobin: 13.9 g/dL (ref 13.0–17.0)
Hemoglobin: 11.9 g/dL — ABNORMAL LOW (ref 13.0–17.0)
Hemoglobin: 10.2 g/dL — ABNORMAL LOW (ref 13.0–17.0)
Hemoglobin: 10.5 g/dL — ABNORMAL LOW (ref 13.0–17.0)
O2 Saturation: 100 %
O2 Saturation: 100 %
O2 Saturation: 100 %
O2 Saturation: 98 %
Potassium: 4.4 mmol/L (ref 3.5–5.1)
Potassium: 4.6 mmol/L (ref 3.5–5.1)
Potassium: 5.4 mmol/L — ABNORMAL HIGH (ref 3.5–5.1)
Potassium: 4.8 mmol/L (ref 3.5–5.1)
Sodium: 137 mmol/L (ref 135–145)
Sodium: 139 mmol/L (ref 135–145)
Sodium: 137 mmol/L (ref 135–145)
Sodium: 140 mmol/L (ref 135–145)
TCO2: 28 mmol/L (ref 22–32)
TCO2: 28 mmol/L (ref 22–32)
TCO2: 26 mmol/L (ref 22–32)
TCO2: 22 mmol/L (ref 22–32)
pCO2 arterial: 54.1 mmHg — ABNORMAL HIGH (ref 32.0–48.0)
pCO2 arterial: 46.5 mmHg (ref 32.0–48.0)
pCO2 arterial: 38.9 mmHg (ref 32.0–48.0)
pCO2 arterial: 42.4 mmHg (ref 32.0–48.0)
pH, Arterial: 7.302 — ABNORMAL LOW (ref 7.350–7.450)
pH, Arterial: 7.366 (ref 7.350–7.450)
pH, Arterial: 7.408 (ref 7.350–7.450)
pH, Arterial: 7.305 — ABNORMAL LOW (ref 7.350–7.450)
pO2, Arterial: 325 mmHg — ABNORMAL HIGH (ref 83.0–108.0)
pO2, Arterial: 279 mmHg — ABNORMAL HIGH (ref 83.0–108.0)
pO2, Arterial: 498 mmHg — ABNORMAL HIGH (ref 83.0–108.0)
pO2, Arterial: 115 mmHg — ABNORMAL HIGH (ref 83.0–108.0)

## 2019-05-22 LAB — POCT I-STAT, CHEM 8
BUN: 17 mg/dL (ref 8–23)
BUN: 14 mg/dL (ref 8–23)
BUN: 16 mg/dL (ref 8–23)
BUN: 14 mg/dL (ref 8–23)
BUN: 15 mg/dL (ref 8–23)
BUN: 16 mg/dL (ref 8–23)
BUN: 17 mg/dL (ref 8–23)
BUN: 17 mg/dL (ref 8–23)
Calcium, Ion: 1.31 mmol/L (ref 1.15–1.40)
Calcium, Ion: 1.08 mmol/L — ABNORMAL LOW (ref 1.15–1.40)
Calcium, Ion: 1.29 mmol/L (ref 1.15–1.40)
Calcium, Ion: 1.13 mmol/L — ABNORMAL LOW (ref 1.15–1.40)
Calcium, Ion: 1.08 mmol/L — ABNORMAL LOW (ref 1.15–1.40)
Calcium, Ion: 1.09 mmol/L — ABNORMAL LOW (ref 1.15–1.40)
Calcium, Ion: 1.13 mmol/L — ABNORMAL LOW (ref 1.15–1.40)
Calcium, Ion: 1.14 mmol/L — ABNORMAL LOW (ref 1.15–1.40)
Chloride: 103 mmol/L (ref 98–111)
Chloride: 103 mmol/L (ref 98–111)
Chloride: 104 mmol/L (ref 98–111)
Chloride: 106 mmol/L (ref 98–111)
Chloride: 106 mmol/L (ref 98–111)
Chloride: 105 mmol/L (ref 98–111)
Chloride: 108 mmol/L (ref 98–111)
Chloride: 110 mmol/L (ref 98–111)
Creatinine, Ser: 0.6 mg/dL — ABNORMAL LOW (ref 0.61–1.24)
Creatinine, Ser: 0.5 mg/dL — ABNORMAL LOW (ref 0.61–1.24)
Creatinine, Ser: 0.6 mg/dL — ABNORMAL LOW (ref 0.61–1.24)
Creatinine, Ser: 0.5 mg/dL — ABNORMAL LOW (ref 0.61–1.24)
Creatinine, Ser: 0.6 mg/dL — ABNORMAL LOW (ref 0.61–1.24)
Creatinine, Ser: 0.6 mg/dL — ABNORMAL LOW (ref 0.61–1.24)
Creatinine, Ser: 0.7 mg/dL (ref 0.61–1.24)
Creatinine, Ser: 0.8 mg/dL (ref 0.61–1.24)
Glucose, Bld: 104 mg/dL — ABNORMAL HIGH (ref 70–99)
Glucose, Bld: 117 mg/dL — ABNORMAL HIGH (ref 70–99)
Glucose, Bld: 124 mg/dL — ABNORMAL HIGH (ref 70–99)
Glucose, Bld: 163 mg/dL — ABNORMAL HIGH (ref 70–99)
Glucose, Bld: 155 mg/dL — ABNORMAL HIGH (ref 70–99)
Glucose, Bld: 144 mg/dL — ABNORMAL HIGH (ref 70–99)
Glucose, Bld: 117 mg/dL — ABNORMAL HIGH (ref 70–99)
Glucose, Bld: 139 mg/dL — ABNORMAL HIGH (ref 70–99)
HCT: 40 % (ref 39.0–52.0)
HCT: 33 % — ABNORMAL LOW (ref 39.0–52.0)
HCT: 40 % (ref 39.0–52.0)
HCT: 31 % — ABNORMAL LOW (ref 39.0–52.0)
HCT: 32 % — ABNORMAL LOW (ref 39.0–52.0)
HCT: 30 % — ABNORMAL LOW (ref 39.0–52.0)
HCT: 28 % — ABNORMAL LOW (ref 39.0–52.0)
HCT: 29 % — ABNORMAL LOW (ref 39.0–52.0)
Hemoglobin: 13.6 g/dL (ref 13.0–17.0)
Hemoglobin: 11.2 g/dL — ABNORMAL LOW (ref 13.0–17.0)
Hemoglobin: 13.6 g/dL (ref 13.0–17.0)
Hemoglobin: 10.5 g/dL — ABNORMAL LOW (ref 13.0–17.0)
Hemoglobin: 10.9 g/dL — ABNORMAL LOW (ref 13.0–17.0)
Hemoglobin: 10.2 g/dL — ABNORMAL LOW (ref 13.0–17.0)
Hemoglobin: 9.5 g/dL — ABNORMAL LOW (ref 13.0–17.0)
Hemoglobin: 9.9 g/dL — ABNORMAL LOW (ref 13.0–17.0)
Potassium: 4.4 mmol/L (ref 3.5–5.1)
Potassium: 4.6 mmol/L (ref 3.5–5.1)
Potassium: 4.6 mmol/L (ref 3.5–5.1)
Potassium: 4.6 mmol/L (ref 3.5–5.1)
Potassium: 4.4 mmol/L (ref 3.5–5.1)
Potassium: 4.7 mmol/L (ref 3.5–5.1)
Potassium: 4.8 mmol/L (ref 3.5–5.1)
Potassium: 5.2 mmol/L — ABNORMAL HIGH (ref 3.5–5.1)
Sodium: 137 mmol/L (ref 135–145)
Sodium: 137 mmol/L (ref 135–145)
Sodium: 138 mmol/L (ref 135–145)
Sodium: 136 mmol/L (ref 135–145)
Sodium: 137 mmol/L (ref 135–145)
Sodium: 139 mmol/L (ref 135–145)
Sodium: 139 mmol/L (ref 135–145)
Sodium: 139 mmol/L (ref 135–145)
TCO2: 28 mmol/L (ref 22–32)
TCO2: 25 mmol/L (ref 22–32)
TCO2: 27 mmol/L (ref 22–32)
TCO2: 29 mmol/L (ref 22–32)
TCO2: 25 mmol/L (ref 22–32)
TCO2: 24 mmol/L (ref 22–32)
TCO2: 22 mmol/L (ref 22–32)
TCO2: 26 mmol/L (ref 22–32)

## 2019-05-22 LAB — CBC
HCT: 35.3 % — ABNORMAL LOW (ref 39.0–52.0)
HCT: 36 % — ABNORMAL LOW (ref 39.0–52.0)
Hemoglobin: 11.8 g/dL — ABNORMAL LOW (ref 13.0–17.0)
Hemoglobin: 12 g/dL — ABNORMAL LOW (ref 13.0–17.0)
MCH: 31.1 pg (ref 26.0–34.0)
MCH: 31.5 pg (ref 26.0–34.0)
MCHC: 33.4 g/dL (ref 30.0–36.0)
MCHC: 33.3 g/dL (ref 30.0–36.0)
MCV: 93.1 fL (ref 80.0–100.0)
MCV: 94.5 fL (ref 80.0–100.0)
Platelets: 148 10*3/uL — ABNORMAL LOW (ref 150–400)
Platelets: 168 10*3/uL (ref 150–400)
RBC: 3.79 MIL/uL — ABNORMAL LOW (ref 4.22–5.81)
RBC: 3.81 MIL/uL — ABNORMAL LOW (ref 4.22–5.81)
RDW: 12.6 % (ref 11.5–15.5)
RDW: 12.7 % (ref 11.5–15.5)
WBC: 12 10*3/uL — ABNORMAL HIGH (ref 4.0–10.5)
WBC: 14.8 10*3/uL — ABNORMAL HIGH (ref 4.0–10.5)
nRBC: 0 % (ref 0.0–0.2)
nRBC: 0 % (ref 0.0–0.2)

## 2019-05-22 LAB — BASIC METABOLIC PANEL
Anion gap: 7 (ref 5–15)
Anion gap: 7 (ref 5–15)
BUN: 16 mg/dL (ref 8–23)
BUN: 15 mg/dL (ref 8–23)
CO2: 20 mmol/L — ABNORMAL LOW (ref 22–32)
CO2: 23 mmol/L (ref 22–32)
Calcium: 8 mg/dL — ABNORMAL LOW (ref 8.9–10.3)
Calcium: 8.3 mg/dL — ABNORMAL LOW (ref 8.9–10.3)
Chloride: 107 mmol/L (ref 98–111)
Chloride: 101 mmol/L (ref 98–111)
Creatinine, Ser: 0.78 mg/dL (ref 0.61–1.24)
Creatinine, Ser: 0.94 mg/dL (ref 0.61–1.24)
GFR calc Af Amer: >60 mL/min (ref >60)
GFR calc Af Amer: >60 mL/min (ref >60)
GFR calc non Af Amer: >60 mL/min (ref >60)
GFR calc non Af Amer: >60 mL/min (ref >60)
Glucose, Bld: 114 mg/dL — ABNORMAL HIGH (ref 70–99)
Glucose, Bld: 129 mg/dL — ABNORMAL HIGH (ref 70–99)
Potassium: 4.8 mmol/L (ref 3.5–5.1)
Potassium: 4.3 mmol/L (ref 3.5–5.1)
Sodium: 134 mmol/L — ABNORMAL LOW (ref 135–145)
Sodium: 131 mmol/L — ABNORMAL LOW (ref 135–145)

## 2019-05-22 LAB — GLUCOSE, CAPILLARY
Glucose-Capillary: 102 mg/dL — ABNORMAL HIGH (ref 70–99)
Glucose-Capillary: 107 mg/dL — ABNORMAL HIGH (ref 70–99)
Glucose-Capillary: 111 mg/dL — ABNORMAL HIGH (ref 70–99)
Glucose-Capillary: 113 mg/dL — ABNORMAL HIGH (ref 70–99)
Glucose-Capillary: 117 mg/dL — ABNORMAL HIGH (ref 70–99)
Glucose-Capillary: 118 mg/dL — ABNORMAL HIGH (ref 70–99)
Glucose-Capillary: 119 mg/dL — ABNORMAL HIGH (ref 70–99)
Glucose-Capillary: 129 mg/dL — ABNORMAL HIGH (ref 70–99)
Glucose-Capillary: 97 mg/dL (ref 70–99)

## 2019-05-22 LAB — ECHO INTRAOPERATIVE TEE
Height: 74 in
Weight: 3200 oz

## 2019-05-22 LAB — MAGNESIUM
Magnesium: 2.8 mg/dL — ABNORMAL HIGH (ref 1.7–2.4)
Magnesium: 2.4 mg/dL (ref 1.7–2.4)

## 2019-05-22 LAB — SURGICAL PATHOLOGY

## 2019-05-22 MED ORDER — ASPIRIN EC 81 MG PO TBEC
81.0000 mg | DELAYED_RELEASE_TABLET | Freq: Every day | ORAL | Status: DC
  Administered 2019-05-23 – 2019-05-25 (×3): 81 mg via ORAL
  Filled 2019-05-22 (×3): qty 1

## 2019-05-22 MED ORDER — ENOXAPARIN SODIUM 40 MG/0.4ML ~~LOC~~ SOLN
40.0000 mg | Freq: Every day | SUBCUTANEOUS | Status: DC
  Filled 2019-05-22 (×2): qty 0.4

## 2019-05-22 MED ORDER — WARFARIN SODIUM 2.5 MG PO TABS
2.5000 mg | ORAL_TABLET | Freq: Every day | ORAL | Status: DC
  Administered 2019-05-22 – 2019-05-24 (×3): 2.5 mg via ORAL
  Filled 2019-05-22 (×3): qty 1

## 2019-05-22 MED ORDER — SODIUM CHLORIDE 0.9% FLUSH: 10.0000 mL | INTRAVENOUS | Status: DC | PRN

## 2019-05-22 MED ORDER — SODIUM CHLORIDE 0.9% FLUSH: 10.0000 mL | Freq: Two times a day (BID) | INTRAVENOUS | Status: DC

## 2019-05-22 MED ORDER — WARFARIN - PHYSICIAN DOSING INPATIENT: Freq: Every day | Status: DC

## 2019-05-22 MED ORDER — INSULIN ASPART 100 UNIT/ML ~~LOC~~ SOLN
0.0000 [IU] | SUBCUTANEOUS | Status: DC
  Administered 2019-05-22 – 2019-05-23 (×2): 2 [IU] via SUBCUTANEOUS

## 2019-05-22 MED ORDER — METOPROLOL TARTRATE 12.5 MG HALF TABLET
12.5000 mg | ORAL_TABLET | Freq: Two times a day (BID) | ORAL | Status: DC
  Administered 2019-05-22 – 2019-05-25 (×7): 12.5 mg via ORAL
  Filled 2019-05-22 (×7): qty 1

## 2019-05-22 MED ORDER — FUROSEMIDE 10 MG/ML IJ SOLN
20.0000 mg | Freq: Four times a day (QID) | INTRAMUSCULAR | Status: AC
  Administered 2019-05-22 (×3): 20 mg via INTRAVENOUS
  Filled 2019-05-22 (×3): qty 2

## 2019-05-22 MED ORDER — ASPIRIN EC 325 MG PO TBEC
325.0000 mg | DELAYED_RELEASE_TABLET | Freq: Every day | ORAL | Status: AC
  Administered 2019-05-22: 10:00:00 325 mg via ORAL
  Filled 2019-05-22: qty 1

## 2019-05-22 MED ORDER — KETOROLAC TROMETHAMINE 15 MG/ML IJ SOLN
15.0000 mg | Freq: Four times a day (QID) | INTRAMUSCULAR | Status: DC | PRN
  Administered 2019-05-22 (×2): 15 mg via INTRAVENOUS
  Filled 2019-05-22 (×2): qty 1

## 2019-05-22 MED ORDER — KETOROLAC TROMETHAMINE 15 MG/ML IJ SOLN
15.0000 mg | Freq: Four times a day (QID) | INTRAMUSCULAR | Status: DC
  Administered 2019-05-22 – 2019-05-24 (×6): 15 mg via INTRAVENOUS
  Filled 2019-05-22 (×6): qty 1

## 2019-05-22 NOTE — Progress Notes (Signed)
      [address]       [place] [address]             [phone]      POD # 1 mitral repair  Got chilled while walking  BP 108/72   Pulse 69   Temp 98.3 F (36.8 C) (Oral)   Resp 19   Ht 6\' 2"  (1.88 m)   Wt 95.6 kg   SpO2 100%   BMI 27.06 kg/m   Intake/Output Summary (Last 24 hours) at 05/22/2019 1655 Last data filed at 05/22/2019 1600 Gross per 24 hour  Intake 2124.08 ml  Output 3745 ml  Net -1620.92 ml   Doing well POD # 1 Requesting Toradol be standing order- done  Stow C. Roxan Hockey, MD [facility] [phone]

## 2019-05-22 NOTE — Progress Notes (Addendum)
T[facility] DAILY ICU PROGRESS NOTE                   [address]            [place] [address]          [phone]   1 Day Post-Op Procedure(s) (LRB): MINIMALLY INVASIVE MITRAL VALVE REPAIR (MVR) USING MEMO 4D MITRAL RING SIZE 38MM (Right) CLIPPING OF ATRIAL APPENDAGE USING ATRICURE CLIP SIZE 45MM (N/A) TRANSESOPHAGEAL ECHOCARDIOGRAM (TEE) (N/A)  Total Length of Stay:  LOS: 1 day   Subjective: Feels okay this morning. He did have terrible pain last night that was not controlled by the oxycodone. He feels a little better this morning. Using his IS and wanting to get to the chair. He is motivated to walk later today.   Objective: Vital signs in last 24 hours: Temp:  [97.9 F (36.6 C)-99.5 F (37.5 C)] 97.9 F (36.6 C) (02/19 0600) Pulse Rate:  [57-81] 81 (02/19 0600) Cardiac Rhythm: A-V Sequential paced (02/19 0400) Resp:  [9-21] 12 (02/19 0600) BP: (89-138)/(62-77) 112/65 (02/19 0600) SpO2:  [93 %-100 %] 99 % (02/19 0600) Arterial Line BP: (97-139)/(48-69) 118/48 (02/19 0600) Weight:  [95.6 kg] 95.6 kg (02/19 0452)  Filed Weights   05/21/19 0613 05/22/19 0452  Weight: 90.7 kg 95.6 kg    Weight change: 4.881 kg   Hemodynamic parameters for last 24 hours: PAP: (24-35)/(12-24) 24/17 CO:  [4.3 L/min-7.4 L/min] 7.4 L/min CI:  [2 L/min/m2-3.4 L/min/m2] 3.4 L/min/m2  Intake/Output from previous day: 02/18 0701 - 02/19 0700 In: 3966.8 [I.V.:2982.5; IV Piggyback:984.3] Out: [id number] [Urine:2600; Chest Tube:430]  Intake/Output this shift: No intake/output data recorded.  Current Meds: Scheduled Meds: . acetaminophen  1,000 mg Oral Q6H  . aspirin EC  325 mg Oral Daily  . [START ON 05/23/2019] aspirin EC  81 mg Oral Daily  . bisacodyl  10 mg Oral Daily   Or  . bisacodyl  10 mg Rectal Daily  . bupivacaine liposome  20 mL Infiltration Once  . Chlorhexidine Gluconate Cloth  6 each Topical Daily  . docusate sodium  200 mg Oral Daily  . [START ON 05/23/2019]  enoxaparin (LOVENOX) injection  40 mg Subcutaneous QHS  . insulin aspart  0-24 Units Subcutaneous Q4H  . montelukast  10 mg Oral QPM  . [START ON 05/23/2019] pantoprazole  40 mg Oral Daily  . sodium chloride flush  3 mL Intravenous Q12H  . warfarin  2.5 mg Oral q1800  . Warfarin - Physician Dosing Inpatient   Does not apply q1800   Continuous Infusions: . sodium chloride    . albumin human 12.5 g (05/21/19 1913)  . cefUROXime (ZINACEF)  IV Stopped (05/21/19 2057)  . famotidine (PEPCID) IV Stopped (05/21/19 2155)  . insulin 0.8 mL/hr at 05/22/19 0700  . lactated ringers    . lactated ringers    . phenylephrine (NEO-SYNEPHRINE) Adult infusion Stopped (05/21/19 1630)   PRN Meds:.albumin human, dextrose, metoprolol tartrate, morphine injection, ondansetron (ZOFRAN) IV, oxyCODONE, sodium chloride flush, traMADol  General appearance: alert, cooperative and no distress Heart: regular rate and rhythm, S1, S2 normal, no murmur, click, rub or gallop and paced Lungs: clear to auscultation bilaterally and diminished in the lower lobes Abdomen: soft, non-tender; bowel sounds normal; no masses,  no organomegaly Extremities: extremities normal, atraumatic, no cyanosis or edema Wound: clean and dry covered with a sterile dressing  Lab Results: CBC: Recent Labs    05/21/19 2230 05/22/19 0241  WBC  11.0* 12.0*  HGB 12.2* 11.8*  HCT 35.4* 35.3*  PLT 146* 148*   BMET:  Recent Labs    05/21/19 2230 05/22/19 0241  NA 134* 134*  K 4.8 4.8  CL 111 107  CO2 19* 20*  GLUCOSE 132* 114*  BUN 18 16  CREATININE 0.86 0.78  CALCIUM 7.8* 8.0*    CMET: Lab Results  Component Value Date   WBC 12.0 (H) 05/22/2019   HGB 11.8 (L) 05/22/2019   HCT 35.3 (L) 05/22/2019   PLT 148 (L) 05/22/2019   GLUCOSE 114 (H) 05/22/2019   CHOL 230 (H) 12/24/2018   TRIG 133 12/24/2018   HDL 75 12/24/2018   LDLDIRECT 130.1 04/10/2012   LDLCALC 130 (H) 12/24/2018   ALT 38 05/19/2019   AST 39 05/19/2019   NA  134 (L) 05/22/2019   K 4.8 05/22/2019   CL 107 05/22/2019   CREATININE 0.78 05/22/2019   BUN 16 05/22/2019   CO2 20 (L) 05/22/2019   TSH 2.11 02/24/2007   PSA 0.8 12/24/2018   INR 1.4 (H) 05/21/2019   HGBA1C 5.6 05/19/2019      PT/INR:  Recent Labs    05/21/19 1701  LABPROT 17.3*  INR 1.4*   Radiology: [facility] Chest Port 1 View  Result Date: 05/21/2019 CLINICAL DATA:  Status post mitral valve repair. EXAM: PORTABLE CHEST 1 VIEW COMPARISON:  05/19/2019 FINDINGS: Interval enlarged cardiac silhouette. Interval prosthetic mitral valve. Decreased depth of inspiration with minimal left basilar atelectasis. Otherwise, clear lungs with normal vascularity. Left jugular Swan-Ganz catheter tip in the main pulmonary artery segment or proximal right main pulmonary artery. Right chest tube in place with no pneumothorax seen. The far superior aspect of the right lung apex not included. Unremarkable bones. IMPRESSION: 1. Poor inspiration with minimal left basilar atelectasis. 2. Interval cardiomegaly and prosthetic mitral valve. Electronically Signed   By: Claudie Revering M.D.   On: 05/21/2019 17:05     Assessment/Plan: S/P Procedure(s) (LRB): MINIMALLY INVASIVE MITRAL VALVE REPAIR (MVR) USING MEMO 4D MITRAL RING SIZE 38MM (Right) CLIPPING OF ATRIAL APPENDAGE USING ATRICURE CLIP SIZE 45MM (N/A) TRANSESOPHAGEAL ECHOCARDIOGRAM (TEE) (N/A)  1. CV-NSR in the 80s paced. BP well controlled. Off all drips. NSR with AV block under the pacer.   2. Pulm-bilateral atelectasis on CXR this morning. Tolerating 4L [place], weaning as able. Encouraged to use his incentive spirometer. Continue singulair.   3. Renal-creatinine 0.78, electrolytes okay. Good urine output. Expected volume overload.  4. H and H 11.8/35.3, expected acute blood loss anemia.  5. Endo-blood glucose well controlled. Will transition from insulin drip to SSI.  6. Coumadin tonight. Will follow INRs.   Plan: See progression orders. Progress diet. OOB  to chair. Discontinuing swan-ganz catheter and arterial line. Keep foley for now. Encouraged to use his incentive spirometer. Will try Toradol for pain.      Elgie Collard 05/22/2019 7:16 AM   I have seen and examined the patient and agree with the assessment and plan as outlined.  Doing well POD1.  No murmur on exam.  Maintaining NSR w/ stable BP and hemodynamics, no drips.  Breathing comfortably w/ O2 sats 100% on 4 L/min and CXR looks good.  Expected post op acute blood loss anemia, mild > observe.  Expected post op atelectasis, mild > mobilize, pulm toilet.  Begin diuresis for expected post op volume excess.  Start Coumadin slowly.  Start low dose metoprolol.  Add Toradol for chest wall discomfort.   Rexene Alberts,  MD 05/22/2019 10:17 AM

## 2019-05-22 NOTE — Plan of Care (Signed)
  Problem: Clinical Measurements: Goal: Postoperative complications will be avoided or minimized Outcome: Progressing   Problem: Skin Integrity: Goal: Wound healing without signs and symptoms of infection Outcome: Progressing   Problem: Cardiac: Goal: Will achieve and/or maintain hemodynamic stability Outcome: Progressing

## 2019-05-22 NOTE — Anesthesia Postprocedure Evaluation (Signed)
Anesthesia Post Note  Patient: [name], MD  Procedure(s) Performed: MINIMALLY INVASIVE MITRAL VALVE REPAIR (MVR) USING MEMO 4D MITRAL RING SIZE 38MM (Right Chest) CLIPPING OF ATRIAL APPENDAGE USING ATRICURE CLIP SIZE 45MM (N/A Chest) TRANSESOPHAGEAL ECHOCARDIOGRAM (TEE) (N/A )     Patient location during evaluation: SICU Anesthesia Type: General Level of consciousness: sedated Pain management: pain level controlled Vital Signs Assessment: post-procedure vital signs reviewed and stable Respiratory status: patient remains intubated per anesthesia plan Cardiovascular status: stable Postop Assessment: no apparent nausea or vomiting Anesthetic complications: no    Last Vitals:  Vitals:   05/22/19 0800 05/22/19 0813  BP: 100/61   Pulse: 80   Resp: 18   Temp:  (!) 36.4 C  SpO2: 100%     Last Pain:  Vitals:   05/22/19 0813  TempSrc: Oral  PainSc:                  Colton S

## 2019-05-23 ENCOUNTER — Inpatient Hospital Stay (HOSPITAL_COMMUNITY): Payer: 59

## 2019-05-23 DIAGNOSIS — D62 Acute posthemorrhagic anemia: Secondary | ICD-10-CM | POA: Diagnosis not present

## 2019-05-23 DIAGNOSIS — E041 Nontoxic single thyroid nodule: Secondary | ICD-10-CM | POA: Diagnosis not present

## 2019-05-23 DIAGNOSIS — I340 Nonrheumatic mitral (valve) insufficiency: Secondary | ICD-10-CM | POA: Diagnosis not present

## 2019-05-23 DIAGNOSIS — Z006 Encounter for examination for normal comparison and control in clinical research program: Secondary | ICD-10-CM | POA: Diagnosis not present

## 2019-05-23 DIAGNOSIS — E8770 Fluid overload, unspecified: Secondary | ICD-10-CM | POA: Diagnosis not present

## 2019-05-23 DIAGNOSIS — E785 Hyperlipidemia, unspecified: Secondary | ICD-10-CM | POA: Diagnosis not present

## 2019-05-23 DIAGNOSIS — Z20822 Contact with and (suspected) exposure to covid-19: Secondary | ICD-10-CM | POA: Diagnosis not present

## 2019-05-23 DIAGNOSIS — J9811 Atelectasis: Secondary | ICD-10-CM | POA: Diagnosis not present

## 2019-05-23 DIAGNOSIS — K219 Gastro-esophageal reflux disease without esophagitis: Secondary | ICD-10-CM | POA: Diagnosis not present

## 2019-05-23 LAB — BASIC METABOLIC PANEL
Anion gap: 8 (ref 5–15)
BUN: 15 mg/dL (ref 8–23)
CO2: 24 mmol/L (ref 22–32)
Calcium: 8.5 mg/dL — ABNORMAL LOW (ref 8.9–10.3)
Chloride: 100 mmol/L (ref 98–111)
Creatinine, Ser: 0.86 mg/dL (ref 0.61–1.24)
GFR calc Af Amer: >60 mL/min (ref >60)
GFR calc non Af Amer: >60 mL/min (ref >60)
Glucose, Bld: 113 mg/dL — ABNORMAL HIGH (ref 70–99)
Potassium: 4.2 mmol/L (ref 3.5–5.1)
Sodium: 132 mmol/L — ABNORMAL LOW (ref 135–145)

## 2019-05-23 LAB — GLUCOSE, CAPILLARY
Glucose-Capillary: 110 mg/dL — ABNORMAL HIGH (ref 70–99)
Glucose-Capillary: 128 mg/dL — ABNORMAL HIGH (ref 70–99)
Glucose-Capillary: 162 mg/dL — ABNORMAL HIGH (ref 70–99)

## 2019-05-23 LAB — CBC
HCT: 35.1 % — ABNORMAL LOW (ref 39.0–52.0)
Hemoglobin: 11.5 g/dL — ABNORMAL LOW (ref 13.0–17.0)
MCH: 31 pg (ref 26.0–34.0)
MCHC: 32.8 g/dL (ref 30.0–36.0)
MCV: 94.6 fL (ref 80.0–100.0)
Platelets: 144 10*3/uL — ABNORMAL LOW (ref 150–400)
RBC: 3.71 MIL/uL — ABNORMAL LOW (ref 4.22–5.81)
RDW: 12.7 % (ref 11.5–15.5)
WBC: 12.2 10*3/uL — ABNORMAL HIGH (ref 4.0–10.5)
nRBC: 0 % (ref 0.0–0.2)

## 2019-05-23 LAB — PROTIME-INR
INR: 1.2 (ref 0.8–1.2)
Prothrombin Time: 14.9 seconds (ref 11.4–15.2)

## 2019-05-23 MED ORDER — MAGNESIUM HYDROXIDE 400 MG/5ML PO SUSP: 30.0000 mL | Freq: Every day | ORAL | Status: DC | PRN

## 2019-05-23 MED ORDER — FUROSEMIDE 40 MG PO TABS
40.0000 mg | ORAL_TABLET | Freq: Every day | ORAL | Status: AC
  Administered 2019-05-23 – 2019-05-25 (×3): 40 mg via ORAL
  Filled 2019-05-23 (×3): qty 1

## 2019-05-23 MED ORDER — SODIUM CHLORIDE 0.9 % IV SOLN: 250.0000 mL | INTRAVENOUS | Status: DC | PRN

## 2019-05-23 MED ORDER — POLYETHYLENE GLYCOL 3350 17 G PO PACK
17.0000 g | PACK | Freq: Every day | ORAL | Status: DC | PRN
  Administered 2019-05-23 – 2019-05-24 (×2): 17 g via ORAL
  Filled 2019-05-23 (×3): qty 1

## 2019-05-23 MED ORDER — SODIUM CHLORIDE 0.9% FLUSH
3.0000 mL | Freq: Two times a day (BID) | INTRAVENOUS | Status: DC
  Administered 2019-05-24: 3 mL via INTRAVENOUS

## 2019-05-23 MED ORDER — COUMADIN BOOK
Freq: Once | Status: AC
  Filled 2019-05-23: qty 1

## 2019-05-23 MED ORDER — ~~LOC~~ CARDIAC SURGERY, PATIENT & FAMILY EDUCATION: Freq: Once | Status: DC

## 2019-05-23 MED ORDER — POTASSIUM CHLORIDE CRYS ER 20 MEQ PO TBCR
20.0000 meq | EXTENDED_RELEASE_TABLET | Freq: Every day | ORAL | Status: AC
  Administered 2019-05-23 – 2019-05-25 (×3): 20 meq via ORAL
  Filled 2019-05-23 (×3): qty 1

## 2019-05-23 MED ORDER — SODIUM CHLORIDE 0.9% FLUSH: 3.0000 mL | INTRAVENOUS | Status: DC | PRN

## 2019-05-23 MED ORDER — ZOLPIDEM TARTRATE 5 MG PO TABS: 5.0000 mg | ORAL_TABLET | Freq: Every evening | ORAL | Status: DC | PRN

## 2019-05-23 NOTE — Progress Notes (Signed)
2 Days Post-Op Procedure(s) (LRB): MINIMALLY INVASIVE MITRAL VALVE REPAIR (MVR) USING MEMO 4D MITRAL RING SIZE 38MM (Right) CLIPPING OF ATRIAL APPENDAGE USING ATRICURE CLIP SIZE 45MM (N/A) TRANSESOPHAGEAL ECHOCARDIOGRAM (TEE) (N/A) Subjective: Unable to void since catheter removed at 5:30 AM Incisional pain well controlled with toradol  Objective: Vital signs in last 24 hours: Temp:  [97.4 F (36.3 C)-98.4 F (36.9 C)] 98.4 F (36.9 C) (02/20 0730) Pulse Rate:  [62-88] 72 (02/20 0800) Cardiac Rhythm: Normal sinus rhythm (02/20 0400) Resp:  [13-24] 16 (02/20 0800) BP: (92-146)/(56-81) 122/68 (02/20 0800) SpO2:  [92 %-100 %] 100 % (02/20 0800) Arterial Line BP: (133-154)/(55-68) 154/68 (02/19 1000) Weight:  [97.3 kg] 97.3 kg (02/20 0500)  Hemodynamic parameters for last 24 hours:    Intake/Output from previous day: 02/19 0701 - 02/20 0700 In: 1060.4 [P.O.:960; I.V.:0.4; IV Piggyback:100] Out: 3090 [Urine:2770; Chest Tube:320] Intake/Output this shift: Total I/O In: 240 [P.O.:240] Out: -   General appearance: alert, cooperative and no distress Neurologic: intact Heart: regular rate and rhythm Lungs: clear to auscultation bilaterally  Lab Results: Recent Labs    05/22/19 1633 05/23/19 0500  WBC 14.8* 12.2*  HGB 12.0* 11.5*  HCT 36.0* 35.1*  PLT 168 144*   BMET:  Recent Labs    05/22/19 1633 05/23/19 0500  NA 131* 132*  K 4.3 4.2  CL 101 100  CO2 23 24  GLUCOSE 129* 113*  BUN 15 15  CREATININE 0.94 0.86  CALCIUM 8.3* 8.5*    PT/INR:  Recent Labs    05/23/19 0500  LABPROT 14.9  INR 1.2   ABG    Component Value Date/Time   PHART 7.277 (L) 05/21/2019 1651   HCO3 23.2 05/21/2019 1651   TCO2 25 05/21/2019 1651   ACIDBASEDEF 4.0 (H) 05/21/2019 1651   O2SAT 98.0 05/21/2019 1651   CBG (last 3)  Recent Labs    05/23/19 0006 05/23/19 0405 05/23/19 0728  GLUCAP 128* 110* 162*    Assessment/Plan: S/P Procedure(s) (LRB): MINIMALLY INVASIVE  MITRAL VALVE REPAIR (MVR) USING MEMO 4D MITRAL RING SIZE 38MM (Right) CLIPPING OF ATRIAL APPENDAGE USING ATRICURE CLIP SIZE 45MM (N/A) TRANSESOPHAGEAL ECHOCARDIOGRAM (TEE) (N/A) -CV- in SR, continue current meds RESP- continue IS RENAL- creatinine and lytes OK  Diuresed well yesterday, PO lasix x 3 days ENDO- CBG well controlled Ambulate Transfer to step down    LOS: 2 days    Melrose Nakayama 05/23/2019

## 2019-05-23 NOTE — Progress Notes (Signed)
CARDIAC REHAB PHASE I   PRE:  Rate/Rhythm: 34 SR  BP:  Supine:   Sitting: 130/72  Standing:    SaO2: 98 RA  MODE:  Ambulation: 850 ft   POST:  Rate/Rhythm: 80 SR  BP:  Supine:   Sitting: 134/73  Standing:    SaO2: 97 RA 1500-1535  Pt tolerated ambulation well without c/o. VS stable. Pt walked 850 feet. Back to recliner after walk with call light in reach and wife present.  Rodney Langton RN 05/23/2019 3:40 PM

## 2019-05-24 ENCOUNTER — Inpatient Hospital Stay (HOSPITAL_COMMUNITY): Payer: 59

## 2019-05-24 LAB — CBC
HCT: 31.8 % — ABNORMAL LOW (ref 39.0–52.0)
Hemoglobin: 10.6 g/dL — ABNORMAL LOW (ref 13.0–17.0)
MCH: 31.4 pg (ref 26.0–34.0)
MCHC: 33.3 g/dL (ref 30.0–36.0)
MCV: 94.1 fL (ref 80.0–100.0)
Platelets: 150 10*3/uL (ref 150–400)
RBC: 3.38 MIL/uL — ABNORMAL LOW (ref 4.22–5.81)
RDW: 12.4 % (ref 11.5–15.5)
WBC: 10.7 10*3/uL — ABNORMAL HIGH (ref 4.0–10.5)
nRBC: 0 % (ref 0.0–0.2)

## 2019-05-24 LAB — PROTIME-INR
INR: 1.1 (ref 0.8–1.2)
Prothrombin Time: 14.3 seconds (ref 11.4–15.2)

## 2019-05-24 LAB — BASIC METABOLIC PANEL
Anion gap: 5 (ref 5–15)
BUN: 11 mg/dL (ref 8–23)
CO2: 26 mmol/L (ref 22–32)
Calcium: 8.8 mg/dL — ABNORMAL LOW (ref 8.9–10.3)
Chloride: 106 mmol/L (ref 98–111)
Creatinine, Ser: 0.78 mg/dL (ref 0.61–1.24)
GFR calc Af Amer: >60 mL/min (ref >60)
GFR calc non Af Amer: >60 mL/min (ref >60)
Glucose, Bld: 103 mg/dL — ABNORMAL HIGH (ref 70–99)
Potassium: 4.5 mmol/L (ref 3.5–5.1)
Sodium: 137 mmol/L (ref 135–145)

## 2019-05-24 MED ORDER — POLYETHYLENE GLYCOL 3350 17 G PO PACK
17.0000 g | PACK | Freq: Once | ORAL | Status: AC
  Administered 2019-05-24: 14:00:00 17 g via ORAL

## 2019-05-24 MED ORDER — KETOROLAC TROMETHAMINE 15 MG/ML IJ SOLN
15.0000 mg | Freq: Four times a day (QID) | INTRAMUSCULAR | Status: DC | PRN
  Administered 2019-05-24: 19:00:00 15 mg via INTRAVENOUS
  Filled 2019-05-24: qty 1

## 2019-05-24 NOTE — Progress Notes (Addendum)
      [address]       [place] [address]             [phone]      3 Days Post-Op Procedure(s) (LRB): MINIMALLY INVASIVE MITRAL VALVE REPAIR (MVR) USING MEMO 4D MITRAL RING SIZE 38MM (Right) CLIPPING OF ATRIAL APPENDAGE USING ATRICURE CLIP SIZE 45MM (N/A) TRANSESOPHAGEAL ECHOCARDIOGRAM (TEE) (N/A) Subjective: Feels okay this morning. Hard to sleep in the hospital.   Objective: Vital signs in last 24 hours: Temp:  [98.6 F (37 C)-100.7 F (38.2 C)] 98.9 F (37.2 C) (02/21 0250) Pulse Rate:  [69-79] 73 (02/21 0250) Cardiac Rhythm: (P) Normal sinus rhythm (02/21 0745) Resp:  [13-20] 20 (02/21 0250) BP: (114-155)/(64-89) 155/89 (02/21 0250) SpO2:  [92 %-100 %] 100 % (02/21 0250) Weight:  [96.2 kg] 96.2 kg (02/21 0250)      Intake/Output from previous day: 02/20 0701 - 02/21 0700 In: 820 [P.O.:820] Out: 1160 [Urine:1100; Chest Tube:60] Intake/Output this shift: No intake/output data recorded.  General appearance: alert, cooperative and no distress Heart: regular rate and rhythm, S1, S2 normal, no murmur, click, rub or gallop Lungs: clear to auscultation bilaterally Abdomen: soft, non-tender; bowel sounds normal; no masses,  no organomegaly Extremities: extremities normal, atraumatic, no cyanosis or edema Wound: clean and dry  Lab Results: Recent Labs    05/23/19 0500 05/24/19 0242  WBC 12.2* 10.7*  HGB 11.5* 10.6*  HCT 35.1* 31.8*  PLT 144* 150   BMET:  Recent Labs    05/23/19 0500 05/24/19 0242  NA 132* 137  K 4.2 4.5  CL 100 106  CO2 24 26  GLUCOSE 113* 103*  BUN 15 11  CREATININE 0.86 0.78  CALCIUM 8.5* 8.8*    PT/INR:  Recent Labs    05/24/19 0242  LABPROT 14.3  INR 1.1   ABG    Component Value Date/Time   PHART 7.277 (L) 05/21/2019 1651   HCO3 23.2 05/21/2019 1651   TCO2 25 05/21/2019 1651   ACIDBASEDEF 4.0 (H) 05/21/2019 1651   O2SAT 98.0 05/21/2019 1651   CBG (last 3)  Recent Labs    05/23/19 0006  05/23/19 0405 05/23/19 0728  GLUCAP 128* 110* 162*    Assessment/Plan: S/P Procedure(s) (LRB): MINIMALLY INVASIVE MITRAL VALVE REPAIR (MVR) USING MEMO 4D MITRAL RING SIZE 38MM (Right) CLIPPING OF ATRIAL APPENDAGE USING ATRICURE CLIP SIZE 45MM (N/A) TRANSESOPHAGEAL ECHOCARDIOGRAM (TEE) (N/A)  1. CV-NSR int he 80s, BP mostly well controlled. Continue ASA 2. Tolerating room air with excellent oxygenation. Chest tube with 60cc output. CXR is stable without pneumo.  3. Renal-creatinine 0.78, electrolytes okay 4. H and H 10.6/31.8, expected acute blood loss anemia 5. Endo-CBG discontinued since the patient is not a diabetic 6. Thyroid nodule [place] scheduled for Monday.  7. No further issues with urinary retention   Plan: discontinue EPW, discontinue remaining chest tube. Ambulate in the halls. Encouraged incentive spirometer. May be able to go home tomorrow.     LOS: 3 days    Elgie Collard 05/24/2019 Patient seen and examined, agree with above  Remo Lipps C. Roxan Hockey, MD [facility] [phone]

## 2019-05-24 NOTE — Discharge Instructions (Signed)
Warfarin (Coumadin)   Warfarin is a blood thinner (anticoagulant) medicine. It is used to keep clots from forming in your blood. When you take warfarin, you may bruise easily. You may also bleed a little longer if you cut yourself.  Before taking warfarin, tell your doctor if:  You take any other medicine for your heart or blood pressure.   You are pregnant.   You plan to get pregnant.   You are breastfeeding.   You are allergic to any medicine.  HOME CARE  Take warfarin as told by your doctor. Do not stop the medicine unless your doctor tells you to.   Take your medicine at the same time every day.   Do not take anything that has aspirin in it unless your doctor says it is okay.   Do not drink alcohol.   Tell all your doctors and dentists that you are taking warfarin before they treat you or give you any medicines.   Keep all your appointments for doctor visits and blood tests.   Keep warfarin out of reach of children. Do not share warfarin with anyone.   Eat about the same amount of vitamin K foods every day.   High vitamin K foods: Beef liver. Pork liver. Green tea. Broccoli. Brussels sprouts. Cauliflower. Chickpeas. Kale. Spinach. Turnip greens.   Medium vitamin K foods: Chicken liver. Pork tenderloin. Cheddar cheese. Rolled oats. Coffee. Asparagus. Cabbage. Iceberg lettuce.   Low vitamin K foods: Apples. Butter. Bananas. Skim or 1% milk. Orange rice. Canned pears. White bread. Strawberries. Corn. Tomatoes. Green beans. Eggs. Potatoes. [name]. Pumpkin. Chicken breasts. Ground beef. Oil (except soybean oil).  GET HELP RIGHT AWAY IF:  You miss a dose of warfarin. Do not take 2 doses at the same time.   You have a skin rash.   You have heavy or unusual bleeding.   There is blood in your pee (urine) or poop (stool).   You have side effects from medicine that do not get better after a few days.  MAKE SURE YOU:  Understand these instructions.   Will watch your  condition.   Will get help right away if you are not doing well or get worse.     Warfarin, Questions and Answers  Warfarin (Coumadin) is a prescription medication that delays normal blood clotting. This is called an "anticoagulant" or "blood thinner." These medications do not actually cause the blood to become less thick, only less likely to clot. Normally, when body tissues are cut or damaged, the blood clots in order to prevent blood loss. Some clots form when they are not supposed to and may travel through the bloodstream and become lodged in smaller blood vessels. Blockage of blood flow can cause serious problems including a stroke (when a blood vessel leading to a portion of the brain is blocked) or a heart attack (when a blood vessel leading to the heart is blocked). WHO SHOULD USE WARFARIN?  Warfarin is prescribed for individuals who have a risk for developing harmful blood clots. People at risk include individuals with a mechanical heart valve, individuals with the irregular heart rhythm called atrial fibrillation, and individuals with certain clotting disorders.   Warfarin is also used in individuals who have a history of developing harmful clots, including individuals who have had a stroke, heart attack, a clot which has traveled to the lung (pulmonary embolism), or a blood clot in the leg (deep venous thrombosis or DVT).   Warfarin may be used to prevent the  growth of an existing clot, such as a DVT.  HOW IS THE EFFECT OF WARFARIN MONITORED? The goal of warfarin therapy is to lessen the clotting tendency of blood, but not to prevent clotting completely. Therefore, the anticoagulation effect of warfarin must be carefully watched using laboratory blood tests.  The test most commonly used to measure the effects of warfarin is the prothrombin time ("protime", or PT).   The PT is also used to compute a value known as the International Normalized Ratio (INR).   The longer it takes the  blood to clot, the higher the PT and INR. Your caregiver will tell you what your "target" INR range is. In most cases, the target range will be 2 to 3, although other ranges may be chosen. If, at any time, your INR is below the target range, there is a risk of clotting. If, on the other hand, your INR is above the target range, there is an increased risk of bleeding.  When warfarin is first prescribed, a higher "loading" dose may be given so that an effective blood level of the medication is reached quickly. The loading dose is then adjusted downward until a maintenance dose (a dose which is enough to keep the INR where it should be) is reached. Once you are on a stable maintenance dose, the PT and INR are watched less often, usually once every 2 to 4 weeks. The warfarin dose may be changed at times in response to a changing INR or to medical changes that call for an increase or decrease in warfarin therapy. It is important to keep all laboratory and caregiver follow-up appointments! Not keeping appointments could result in a chronic or permanent injury, pain, or disability. WHAT ARE THE SIDE EFFECTS OF WARFARIN?  Excessive blood thinning can cause bleeding (hemorrhage) from any part of the body. Individuals on warfarin should report any falls or accidents, or any symptoms of bleeding or unusual bruising. Signs of unusual bleeding may include bleeding from the gums, blood in the urine, bloody or dark stool, a nosebleed, coughing up blood, or vomiting blood. Because the risk of bleeding increases as the INR rises above the desired limit, the INR is closely watched during warfarin therapy. Adjustments are made as needed to keep the INR at an acceptable level (within the target range).   Other body systems can be affected by warfarin. In addition to any signs of bleeding, patients should report skin rash or irritation, unusual fever, continual nausea or stomach upset, severe pain in the joints or back, swelling  or pain at an injection site, or symptoms of a stroke.  IS WARFARIN SAFE TO USE DURING PREGNANCY?  Warfarin is generally not advised during pregnancy, at least during the first trimester, due to an increased risk of birth defects. A woman who becomes pregnant or plans to become pregnant while on warfarin therapy should notify the caregiver immediately.   Although warfarin does not pass into breast milk, a woman who wishes to breastfeed while taking warfarin should also consult with her caregiver.  ARE THERE SPECIAL PRECAUTIONS TO FOLLOW WHILE TAKING WARFARIN? Follow the dosage schedule carefully. Warfarin should be taken exactly as directed. A missed or extra dose requires a call to the prescribing caregiver for advice. Do not change the dose of warfarin on your own to make up for missed or extra doses. It is very important to take warfarin as directed since bleeding or blood clots could result in chronic or permanent injury, pain, or  disability. Warfarin tablets come in different strengths. Each tablet strength is usually a different color, with the amount of warfarin (in milligrams) clearly printed on the tablet. You should immediately report to the pharmacist or caregiver any prescription which is different than the previous one, to make sure that you are taking the correct dose. Avoid situations that cause bleeding. You may have a tendency to bleed more easily than usual while taking warfarin. Some simple changes which can limit bleeding are:  Use a softer toothbrush.   Floss with waxed floss rather than unwaxed floss.   Shave with an electric razor rather than a blade.   Limit use of sharp objects.   Avoid potentially harmful activities (such as contact sports).  Medical conditions. Medical conditions which increase your clotting time include:  Diarrhea.   Worsening of heart failure.   Fever.   Worsening of liver function.  Alcohol use. Chronic use of alcohol affects the body's  ability to handle warfarin. You should avoid alcohol or consume it only in very limited quantities. General alcohol intake guidelines are 1 drink for women and 2 drinks for men per day. (1 drink = 5 ounces of wine, 12 ounces of beer or 1 ounces of liquor). Other medications. A number of drugs can interact with warfarin. Some of the most common over-the-counter pain relievers (acetaminophen, aspirin, and nonsteroidal anti-inflammatory medications) make the anticoagulant effects of warfarin stronger. If these medicines are taken, your caregiver may need to adjust the dose of warfarin. Talk to your caregiver if you have problems or questions. Dietary considerations. Some foods and supplements may interfere with warfarin's effectiveness. Examples appear below. Once you are stabilized on a warfarin regimen, no major dietary changes should be made without consulting your caregiver. Vitamin K. Foods high in vitamin K can cause warfarin to be less effective. You should avoid eating very large amounts of foods known to be high in vitamin K. The serving size for foods high in vitamin K are  cup cooked and 1 cup raw, unless otherwise noted, including:  Beef liver (3.5 ounces).   Garbanzo beans.   Kale.   Spinach.   Nettle greens.   Swiss chard.   Pork liver (3.5 ounces).   Broccoli.   Cabbage.   Watercress.   Soybeans.   Endive.   Green tea (made with  ounce dried tea).   Brussels sprouts.   Cauliflower.   Parsley (1 Tablespoon).   Collard greens.   Seaweed (limit 2 sheets).   Turnip greens.   Soybean oil.   Green peas.  Eating large amounts of these foods may seriously reduce how well a dose of warfarin works. It is not necessary to avoid these foods, but do not change the amount of vitamin K-containing foods you currently eat. Changing to a diet low in foods containing vitamin K may lead to an excessive warfarin effect. Do not drink herbal teas containing coumarin while taking  this medication. IS IT SAFE TO USE SUPPLEMENTS WHILE TAKING WARFARIN?  Vitamin E. Vitamin E may increase the anticoagulant effects of warfarin. You should consult your caregiver before adding or changing a dose of vitamin E or any other vitamin.   Check other supplements such as multivitamins and calcium supplements. These may contain vitamin K.  FOR MORE INFORMATION Your caregiver is the best resource for important information related to your particular case. Because every person is different, it is important that your situation is looked at by someone who knows you as  a whole person and also knows the specifics of your condition.   Discharge Instructions:  1. You may shower, please wash incisions daily with soap and water and keep dry.  If you wish to cover wounds with dressing you may do so but please keep clean and change daily.  No tub baths or swimming until incisions have completely healed.  If your incisions become red or develop any drainage please call our office at [phone]  2. No Driving until cleared by our office and you are no longer using narcotic pain medications  3. Monitor your weight daily.. Please use the same scale and weigh at same time... If you gain 3-5 lbs in 48 hours with associated lower extremity swelling, please contact our office at [phone]   4. Fever of 101.5 for at least 24 hours, please contact our office at [phone]  5. Activity- up as tolerated, please walk at least 3 times per day.  Avoid strenuous activity, no lifting, pushing, or pulling with your arms over 8-10 lbs for a minimum of 6 weeks  6. If any questions or concerns arise, please do not hesitate to contact our office at [phone]

## 2019-05-24 NOTE — Discharge Summary (Signed)
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  Admit date: 05/21/2019 Discharge date: 05/25/2019  Admission Diagnoses: Patient Active Problem List   Diagnosis Date Noted  . Celiac axis occlusion with collateral formation 05/20/2019  . Thyroid nodule greater than or equal to 1.5 cm in diameter incidentally noted on imaging study 05/20/2019  . Hematospermia 02/19/2018  . History of brain concussion 01/18/2018  . Atypical nevus of scapular region 01/12/2017  . Hyperlipidemia LDL goal <130 01/10/2016  . Visit for preventive health examination 01/08/2015  . Nonrheumatic mitral valve regurgitation 04/11/2012  . Mitral valve prolapse 04/11/2012  . Hearing loss, right 04/11/2012  . ALLERGIC RHINITIS 11/25/2008  . 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  Patient states that he has known of a presence of a heart murmur since 1991 when it was first noted on routine physical exam.  Echocardiogram performed at that time revealed mitral valve prolapse with mild mitral regurgitation.  He has had regular follow-up echocardiograms performed intermittently ever since, and recent follow-up echocardiograms have documented progressive enlargement of the left atrium but normal left ventricular size and systolic function.  The patient was seen in consultation recently by Dr. Burt Knack and subsequently scheduled for transesophageal  echocardiogram and diagnostic cardiac catheterization which was performed earlier today.  TEE confirmed the presence of mitral valve prolapse with severe prolapse involving a portion of the posterior leaflet with ruptured primary chordae tendinae.  Function remain normal.  Diagnostic cardiac catheterization was notable for the absence of significant coronary artery disease and revealed normal right heart pressures.  Cardiothoracic surgical consultation was requested.  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  He exercises on a regular basis including bicycling on a road bike.  He states that over the past year or 2 his average speed on a bicycle has dropped a slight amount and he has started getting short of breath going up steep hills.  Otherwise he has absolutely no symptoms whatsoever.  He denies any symptoms of shortness of breath with ordinary activity or at rest.  He denies any PND, orthopnea, or lower extremity edema.  He has never had any palpitations, dizzy spells, or atypical chest pain.  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 Roxy Manns on 05/21/2019.  He tolerated the procedure well and was transferred to the surgical ICU for continued care.  He was extubated timely manner.  Postop day 1 he is having some incisional pain.  We added Toradol IV to his pain regimen which helped significantly.  He was tolerating 4 L nasal cannula which were weaning as tolerated.  We restarted his Singulair.  He was paced with a heart rate of 80 bpm.  We initiated a diuretic regimen for fluid overload.  We started low-dose metoprolol.  Postop day 3 his Foley catheter was removed and it was a few hours before he was able to urinate.  Otherwise he remained in normal sinus rhythm and hemodynamically stable.  He diuresed well and we continued his Lasix for fluid overload.  He was  stable to transfer to the telemetry unit for continued care.  Postop day 3 his biggest issue was not being able to sleep in the hospital.  He had no further issues with urinary retention.  He was having regular bowel movements at this time.  He remained in normal sinus rhythm therefore his epicardial pacing wires were removed.  His remaining chest tube had scant output without an air leak therefore we discontinued this chest tube.  He continued to use his incentive spirometer to her hourly and ambulated in the halls without issue.  Today, he is tolerating room air, his incisions are healing well, he is ambulating with limited assistance, and he is ready for discharge home.  Consults: None  Significant Diagnostic Studies:   CLINICAL DATA:  Status post mitral valve repair  EXAM: CHEST - 2 VIEW  COMPARISON:  Chest radiograph from one day prior.  FINDINGS: Stable right apical chest tube position. Mitral valve annuloplasty ring noted. Stable cardiomediastinal silhouette with mild cardiomegaly. No definite pneumothorax. No pleural effusion. No overt pulmonary edema. Hazy bibasilar opacities, slightly increased on the right.  IMPRESSION: 1. No pneumothorax.  Right apical chest tube in place. 2. Hazy bibasilar lung opacities, slightly increased on the right, favor atelectasis.   Electronically Signed   By: Ilona Sorrel M.D.   On: 05/24/2019 05:48   Treatments:  CARDIOTHORACIC SURGERY OPERATIVE NOTE  Date of Procedure:                05/21/2019  Preoperative Diagnosis:      Severe Mitral Regurgitation  Postoperative Diagnosis:    Same  Procedure:        Minimally-Invasive Mitral Valve Repair             Complex valvuloplasty including triangular resection of flail P1 segment of posterior leaflet             Artificial Gore-tex neochord placement x6             Suture plication of P1 and P2 segments of posterior leaflet             Sorin Memo 4D Ring Annuloplasty (size  79mm, catalog # [id number], serial # [id number])             Clipping of left atrial appendage (Atricure Pro245 left atrial clip, size 45 mm)               Surgeon:        Valentina Gu. Roxy Manns, MD  Assistant:       Nicholes Rough, PA-C  Anesthesia:    Albertha Ghee, MD  Operative Findings: ? 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 Severely enlarged flail P1 segment of posterior leaflet with multiple ruptured primary chordae tendinae ? Type II dysfunction with severe mitral regurgitation ? Mild central aortic insufficiency ? Normal left ventricular systolic function ? Systolic anterior motion with persistent MR after initial attempt at repair requiring second bypass run for modification of repair ? No residual mitral regurgitation after successful valve repair  Discharge Exam: Blood pressure (!) 146/79, pulse 70, temperature 98.5 F (36.9 C), temperature source Oral, resp. rate 18, height 6\' 2"  (1.88 m),  weight 94.2 kg, SpO2 99 %.    General appearance: alert, cooperative and no distress Heart: regular rate and rhythm, S1, S2 normal, no murmur, click, rub or gallop Lungs: clear to auscultation bilaterally Abdomen: soft, non-tender; bowel sounds normal; no masses,  no organomegaly Extremities: extremities normal, atraumatic, no cyanosis or edema Wound: clean and dry   Disposition:  Discharge disposition: 01-Home or Self Care        Allergies as of 05/25/2019   No Known Allergies     Medication List    STOP taking these medications   naproxen sodium 220 MG tablet Commonly known as: ALEVE     TAKE these medications   acetaminophen 500 MG tablet Commonly known as: TYLENOL Take 500-1,000 mg by mouth every 6 (six) hours as needed (for pain.). Notes to patient: Last dose 5 am   albuterol 108 (90 Base) MCG/ACT inhaler Commonly known as: ProAir HFA Inhale 1-2 puffs into the lungs every 6 (six) hours as needed for wheezing or shortness of breath.    aspirin 81 MG EC tablet Take 1 tablet (81 mg total) by mouth daily.   esomeprazole 20 MG capsule Commonly known as: NEXIUM Take 20 mg by mouth as needed.   fexofenadine 180 MG tablet Commonly known as: ALLEGRA Take 180 mg by mouth daily as needed (allergies.).   loratadine 10 MG tablet Commonly known as: CLARITIN Take 10 mg by mouth every evening.   metoprolol succinate 25 MG 24 hr tablet Commonly known as: Toprol XL Take 1 tablet (25 mg total) by mouth daily.   montelukast 10 MG tablet Commonly known as: SINGULAIR TAKE ONE (1) TABLET BY MOUTH EVERY DAY What changed:   how much to take  how to take this  when to take this  additional instructions   traMADol 50 MG tablet Commonly known as: ULTRAM Take 1 tablet (50 mg total) by mouth every 6 (six) hours as needed for moderate pain.   triamcinolone cream 0.1 % Commonly known as: KENALOG Apply 1 application topically 2 (two) times daily. What changed:   when to take this  reasons to take this   warfarin 5 MG tablet Commonly known as: COUMADIN Take 1 tablet (5 mg total) by mouth daily at 6 PM. Dose to be titrated based on INR   ZyrTEC Allergy 10 MG tablet Generic drug: cetirizine Take 10 mg by mouth every evening.      Follow-up Information    Crecencio Mc, MD. Call in 1 day(s).   Specialty: Internal Medicine Contact information: [address] [place] [address] [phone]        Sherren Mocha, MD Follow up.   Specialty: Cardiology Contact information: [id number] [address] [phone]        coumadin clinic Follow up.   Why: Instructed patient to follow up in 48 hours with an INR.  Contact information: He plans to follow-up at his own office for his INR checks. They will need to contact Dr. Burt Knack for dosing.        Rexene Alberts, MD Follow up.   Specialty: Cardiothoracic Surgery Why: Please follow-up with Dr. Roxy Manns  on Monday  3/1. The office will call with appointment time. obtain a chest xray at [facility] 1/2 hour prior to appointment. It is in the same office complex Contact information: [address] [place] [address] [phone]  Signed: Elgie Collard 05/25/2019, 11:03 AM

## 2019-05-24 NOTE — Progress Notes (Signed)
Pt ambulated around the  X 1000 feet independently, pt tolerated well

## 2019-05-24 NOTE — Progress Notes (Signed)
EPWs and Chest tubes removed per protocol.  Pt tolerated well.  Will continue to monitor.

## 2019-05-25 ENCOUNTER — Other Ambulatory Visit: Payer: Self-pay

## 2019-05-25 ENCOUNTER — Inpatient Hospital Stay (HOSPITAL_COMMUNITY): Payer: 59

## 2019-05-25 LAB — PROTIME-INR
INR: 1.1 (ref 0.8–1.2)
Prothrombin Time: 14.1 seconds (ref 11.4–15.2)

## 2019-05-25 MED ORDER — WARFARIN SODIUM 5 MG PO TABS: 5.0000 mg | ORAL_TABLET | Freq: Every day | ORAL | Status: DC

## 2019-05-25 MED ORDER — WARFARIN SODIUM 5 MG PO TABS: 5.0000 mg | ORAL_TABLET | Freq: Every day | ORAL | 1 refills | Status: DC

## 2019-05-25 MED ORDER — ASPIRIN 81 MG PO TBEC: 81.0000 mg | DELAYED_RELEASE_TABLET | Freq: Every day | ORAL | Status: DC

## 2019-05-25 MED ORDER — TRAMADOL HCL 50 MG PO TABS: 50.0000 mg | ORAL_TABLET | Freq: Four times a day (QID) | ORAL | 0 refills | Status: DC | PRN

## 2019-05-25 MED ORDER — METOPROLOL SUCCINATE ER 25 MG PO TB24: 25.0000 mg | ORAL_TABLET | Freq: Every day | ORAL | 1 refills | Status: DC

## 2019-05-25 MED FILL — Heparin Sodium (Porcine) Inj 1000 Unit/ML: INTRAMUSCULAR | Qty: 20 | Status: AC

## 2019-05-25 MED FILL — Lidocaine HCl Local Preservative Free (PF) Inj 2%: INTRAMUSCULAR | Qty: 15 | Status: AC

## 2019-05-25 MED FILL — Potassium Chloride Inj 2 mEq/ML: INTRAVENOUS | Qty: 40 | Status: AC

## 2019-05-25 MED FILL — Mannitol IV Soln 20%: INTRAVENOUS | Qty: 500 | Status: AC

## 2019-05-25 MED FILL — Sodium Bicarbonate IV Soln 8.4%: INTRAVENOUS | Qty: 50 | Status: AC

## 2019-05-25 MED FILL — Heparin Sodium (Porcine) Inj 1000 Unit/ML: INTRAMUSCULAR | Qty: 30 | Status: AC

## 2019-05-25 MED FILL — Sodium Chloride IV Soln 0.9%: INTRAVENOUS | Qty: 2000 | Status: AC

## 2019-05-25 MED FILL — Electrolyte-R (PH 7.4) Solution: INTRAVENOUS | Qty: 5000 | Status: AC

## 2019-05-25 NOTE — Progress Notes (Addendum)
      [address]       [place] [address]             [phone]      4 Days Post-Op Procedure(s) (LRB): MINIMALLY INVASIVE MITRAL VALVE REPAIR (MVR) USING MEMO 4D MITRAL RING SIZE 38MM (Right) CLIPPING OF ATRIAL APPENDAGE USING ATRICURE CLIP SIZE 45MM (N/A) TRANSESOPHAGEAL ECHOCARDIOGRAM (TEE) (N/A) Subjective: Walked several times yesterday and is ready for discharge home  Objective: Vital signs in last 24 hours: Temp:  [98.8 F (37.1 C)-99 F (37.2 C)] 99 F (37.2 C) (02/22 0335) Pulse Rate:  [69-73] 69 (02/22 0335) Cardiac Rhythm: Normal sinus rhythm (02/22 0700) Resp:  [15-21] 20 (02/22 0335) BP: (124-151)/(77-87) 149/87 (02/22 0335) SpO2:  [98 %-99 %] 99 % (02/22 0335) Weight:  [94.2 kg] 94.2 kg (02/22 0505)     Intake/Output from previous day: 02/21 0701 - 02/22 0700 In: 160 [P.O.:160] Out: 20 [Chest Tube:20] Intake/Output this shift: No intake/output data recorded.  General appearance: alert, cooperative and no distress Heart: regular rate and rhythm, S1, S2 normal, no murmur, click, rub or gallop Lungs: clear to auscultation bilaterally Abdomen: soft, non-tender; bowel sounds normal; no masses,  no organomegaly Extremities: extremities normal, atraumatic, no cyanosis or edema Wound: clean and dry  Lab Results: Recent Labs    05/23/19 0500 05/24/19 0242  WBC 12.2* 10.7*  HGB 11.5* 10.6*  HCT 35.1* 31.8*  PLT 144* 150   BMET:  Recent Labs    05/23/19 0500 05/24/19 0242  NA 132* 137  K 4.2 4.5  CL 100 106  CO2 24 26  GLUCOSE 113* 103*  BUN 15 11  CREATININE 0.86 0.78  CALCIUM 8.5* 8.8*    PT/INR:  Recent Labs    05/25/19 0103  LABPROT 14.1  INR 1.1   ABG    Component Value Date/Time   PHART 7.277 (L) 05/21/2019 1651   HCO3 23.2 05/21/2019 1651   TCO2 25 05/21/2019 1651   ACIDBASEDEF 4.0 (H) 05/21/2019 1651   O2SAT 98.0 05/21/2019 1651   CBG (last 3)  Recent Labs    05/23/19 0006 05/23/19 0405  05/23/19 0728  GLUCAP 128* 110* 162*    Assessment/Plan: S/P Procedure(s) (LRB): MINIMALLY INVASIVE MITRAL VALVE REPAIR (MVR) USING MEMO 4D MITRAL RING SIZE 38MM (Right) CLIPPING OF ATRIAL APPENDAGE USING ATRICURE CLIP SIZE 45MM (N/A) TRANSESOPHAGEAL ECHOCARDIOGRAM (TEE) (N/A)  1. CV-NSR in the 70s, BP mostly well controlled. Continue ASA 2. Tolerating room air with excellent oxygenation. Chest tube out without an issue yesterday. CXR is stable this morning.   3. Renal-creatinine 0.78, electrolytes okay 4. H and H 10.6/31.8, expected acute blood loss anemia 5. Endo-CBG discontinued since the patient is not a diabetic 6. Thyroid nodule [place] scheduled for today.  7. No further issues with urinary retention  Plan: Discharge today after thyroid US. He plans to do his coumadin checks at his office. He wants his follow-up appointment for 3/1 since he is planning to go back to work in 2 weeks at half days.    LOS: 4 days    [name] 05/25/2019   I have seen and examined the patient and agree with the assessment and plan as outlined.  D/C home today.  Increase Coumadin 5 mg/day.  Instructions given.  Ryan Alberts, MD 05/25/2019 8:54 AM

## 2019-05-25 NOTE — Progress Notes (Signed)
Pt ambulated x 1000 feet around the unit independently, pt tolerated well

## 2019-05-25 NOTE — Progress Notes (Signed)
Pt discharged home with wife. IV and telemetry box removed. Pt received discharge instructions and all questions were answered. Pt instructed to pick up medications at [place]. Pt left with all of his belongings. Pt discharged via wheelchair and was accompanied by a Wilfrid Lund.

## 2019-05-25 NOTE — Consult Note (Signed)
Patient transitioned home prior to contact this morning.  Patient has been assigned for post hospital follow up as part of the [place] with [employer]. Patient will receive follow up call from a [place] for support as part of the plan benefits.  For questions, please contact:  Natividad Brood, RN BSN [facility] Liaison  [phone] business mobile phone Toll free office [phone]  Fax number: [phone] [email or web address]

## 2019-05-25 NOTE — Progress Notes (Signed)
CARDIAC REHAB PHASE I   PRE:  Rate/Rhythm: 70 SR  MODE:  Ambulation: >2000 ft   POST:  Rate/Rhythm: 101 ST   Pt ambulated >2063ft in hallway independently with steady gait. Pt denies pain or SOB. Pt educated on incision care. Reviewed restrictions and exercise guidelines. Encouraged continued IS use. Pt denies needs for home. Pt declining CRP II at this time.   [id number] Rufina Falco, RN BSN 05/25/2019 8:13 AM

## 2019-05-26 ENCOUNTER — Telehealth: Payer: Self-pay

## 2019-05-26 LAB — BPAM RBC
Blood Product Expiration Date: 202103232359
Blood Product Expiration Date: 202103232359
ISSUE DATE / TIME: 202102180710
ISSUE DATE / TIME: 202102180710
Unit Type and Rh: 7300
Unit Type and Rh: 7300

## 2019-05-26 LAB — TYPE AND SCREEN
ABO/RH(D): B POS
Antibody Screen: NEGATIVE
Unit division: 0
Unit division: 0

## 2019-05-26 NOTE — Telephone Encounter (Signed)
Ok thanks 

## 2019-05-26 NOTE — Telephone Encounter (Signed)
Pt reports he was prescribed coumadin for 3 months only as prophylaxis. Reports his INR yesterday was 1.1 after taking 2.5mg  x 4 days. Cardiology increased dose to 5mg  and he was to go to cardiology for coumadin check today but told them he is going to get it checked at this clinic on Friday, 2/26. Pt will continue 5mg  daily until apt.

## 2019-05-26 NOTE — Telephone Encounter (Signed)
Failed attempt to reach patient for transition of care. Discharge states follow up phone call in 1 day. Left message to return call to me regarding how he is recovering and appointment. Will follow as appropriate.

## 2019-05-27 ENCOUNTER — Other Ambulatory Visit: Payer: Self-pay | Admitting: *Deleted

## 2019-05-27 ENCOUNTER — Encounter: Payer: Self-pay | Admitting: *Deleted

## 2019-05-27 NOTE — Telephone Encounter (Signed)
Second failed attempt at patient's preferred number of contact. No answer. Left message encouraging patient to call PMD with any concerns. See [facility] care management note from earlier today for more notes on well being.

## 2019-05-27 NOTE — Patient Outreach (Signed)
[facility]) Care Management  05/27/2019  [name], MD 04/14/1955 [id number]   Transition of care call/case closure   Referral received:05/21/19 Initial outreach:05/27/19 Insurance: [facility]    Subjective: Initial successful telephone call to patient's preferred number in order to complete transition of care assessment; 2 HIPAA identifiers verified. Explained purpose of call and completed transition of care assessment.  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  Spouse is  are assisting with his  recovery. He reports tolerating increased mobility increased distance denies shortness of breath.  Discussed plans to have INR check on Friday 2/26 at his office.  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   Assessment:  Patient voices good understanding of all discharge instructions.  See transition of care flowsheet for assessment details.   Plan:  Reviewed hospital discharge diagnosis of  Minimally invasive mitral valve repair  and discharge treatment plan using hospital discharge instructions, assessing medication adherence, reviewing problems requiring provider notification, and discussing the importance of follow up with surgeon, primary care provider and/or specialists as directed. Reviewed [place] healthy lifestyle program information to receive discounted premium for  2022  Step 1: Get annual physical between April 02, 2018 and October 01, 2019; Step 2: Complete your health assessment between April 03, 2019 and December 02, 2019 at [email or web address] Step 3:Identify your current health status and complete the corresponding action step between January 1, and December 02, 2019.     No ongoing care management needs identified so will close case to [place] Management services and route successful outreach letter with [place] Management pamphlet and 24 Hour Nurse Line Magnet to [place] Management clinical pool to be mailed to patient's home address.  Thanked patient for their services to [facility].  Joylene Draft, RN, BSN  [place] Management Coordinator  [phone]- Mobile [phone]- Toll Free Main Office

## 2019-05-28 NOTE — Telephone Encounter (Signed)
I just want to f/u concerning INR tomorrow. Pt reports range is 2-3 and he is currently taking 5mg  daily.He also reported he was to only be on coumadin for 3 months. Is the dosing, range and length of treatment all correct.

## 2019-05-28 NOTE — Telephone Encounter (Signed)
To Coumadin Clinic for confirmation.

## 2019-05-29 ENCOUNTER — Ambulatory Visit (INDEPENDENT_AMBULATORY_CARE_PROVIDER_SITE_OTHER): Payer: 59 | Admitting: Family Medicine

## 2019-05-29 ENCOUNTER — Telehealth: Payer: Self-pay | Admitting: Family Medicine

## 2019-05-29 ENCOUNTER — Other Ambulatory Visit: Payer: Self-pay | Admitting: Thoracic Surgery (Cardiothoracic Vascular Surgery)

## 2019-05-29 ENCOUNTER — Other Ambulatory Visit: Payer: Self-pay

## 2019-05-29 DIAGNOSIS — I341 Nonrheumatic mitral (valve) prolapse: Secondary | ICD-10-CM

## 2019-05-29 DIAGNOSIS — Z9889 Other specified postprocedural states: Secondary | ICD-10-CM

## 2019-05-29 LAB — POCT INR: INR: 1.4 — AB (ref 2.0–3.0)

## 2019-05-29 NOTE — Telephone Encounter (Signed)
Noted entered on behalf of patient.  Pt is asking for PCP change to me here at [facility].  I am okay with the change, routed to Dr. Derrel Nip for approval.    If approved, then reasonable to get transfer appointment set up.  Thanks.

## 2019-05-29 NOTE — Patient Instructions (Addendum)
Pre visit review using our clinic review tool, if applicable. No additional management support is needed unless otherwise documented below in the visit note.   INR 1.4 today. Pt is also taking 81mg  aspirin daily. Pt reports he is seeing surgeon on 3/1, Monday, to discuss stopping coumadin or at the very least reducing the INR goal to 1.7-2.5.  Pt will remain on 5mg  daily until he talks to surgeon on Monday. He also reports he has an apt with his cardiologist on 3/5, Friday.

## 2019-05-29 NOTE — Telephone Encounter (Signed)
Yes, INR range 2-3 is correct for mitral valve repair. Typical duration of therapy is 3 months but I don't see that they specified this in his discharge summary so would just have him confirm when he sees Dr Roxy Manns for follow up.  We have not seen pt at all as he canceled his Coumadin appt with [place]. We have not advised on any dose changes as we were under the impression that he was managing his own Coumadin. Is he checking his INR at his office and wanting cardiology to provide input on dosing?

## 2019-05-29 NOTE — Telephone Encounter (Signed)
Ok with me 

## 2019-05-31 NOTE — Progress Notes (Signed)
Agree. Thanks

## 2019-05-31 NOTE — Telephone Encounter (Signed)
Please change PCP to me and schedule follow-up when possible.  Would need a 30-minute office visit.  Thanks.

## 2019-06-01 ENCOUNTER — Encounter: Payer: Self-pay | Admitting: Thoracic Surgery (Cardiothoracic Vascular Surgery)

## 2019-06-01 ENCOUNTER — Ambulatory Visit (INDEPENDENT_AMBULATORY_CARE_PROVIDER_SITE_OTHER): Payer: Self-pay | Admitting: Thoracic Surgery (Cardiothoracic Vascular Surgery)

## 2019-06-01 ENCOUNTER — Other Ambulatory Visit: Payer: Self-pay

## 2019-06-01 ENCOUNTER — Ambulatory Visit
Admission: RE | Admit: 2019-06-01 | Discharge: 2019-06-01 | Disposition: A | Discharge status: Home / Self Care | Payer: 59 | Source: Ambulatory Visit | Attending: Cardiothoracic Surgery | Admitting: Cardiothoracic Surgery

## 2019-06-01 VITALS — BP 159/88 | HR 74 | Temp 97.6°F | Resp 20 | Ht 74.0 in | Wt 202.0 lb

## 2019-06-01 DIAGNOSIS — I340 Nonrheumatic mitral (valve) insufficiency: Secondary | ICD-10-CM

## 2019-06-01 DIAGNOSIS — I341 Nonrheumatic mitral (valve) prolapse: Secondary | ICD-10-CM

## 2019-06-01 DIAGNOSIS — Z9889 Other specified postprocedural states: Secondary | ICD-10-CM

## 2019-06-01 DIAGNOSIS — Z952 Presence of prosthetic heart valve: Secondary | ICD-10-CM | POA: Diagnosis not present

## 2019-06-01 NOTE — Progress Notes (Signed)
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  The patient's postoperative recovery was uneventful and he was discharged from the hospital on the fourth postoperative day.  He returns her office today and reports that he is doing exceptionally well.  He is no longer taking even Tylenol for pain.  He has been walking every day and tells me that he can walk at a pace close to 2-1/2 miles an hour for 1.6 miles without difficulty.  He admits that his exercise tolerance is not as good as he had hoped but he seems to be improving steadily.  He has no shortness of breath.  He has minimal residual soreness in his chest.  He has good range of motion of right shoulder.  He has not had any palpitations or dizzy spells.  He checked his INR and his own office last week and it remains subtherapeutic at 1.4.  He is very pleased with his progress.   Current Outpatient Medications  Medication Sig Dispense Refill  . acetaminophen (TYLENOL) 500 MG tablet Take 500-1,000 mg by mouth every 6 (six) hours as needed (for pain.).    Marland Kitchen albuterol (PROAIR HFA) 108 (90 Base) MCG/ACT inhaler Inhale 1-2 puffs into the lungs every 6 (six) hours as needed for wheezing or shortness of breath. 8 g 2  . aspirin 81 MG EC tablet Take 1 tablet (81 mg total) by mouth daily. 30 tablet   . cetirizine (ZYRTEC ALLERGY) 10 MG tablet Take 10 mg by mouth every evening.     Marland Kitchen esomeprazole (NEXIUM) 20 MG capsule Take 20 mg by mouth as needed.    . fexofenadine (ALLEGRA) 180 MG tablet Take 180 mg by mouth daily as needed (allergies.).     Marland Kitchen loratadine (CLARITIN) 10  MG tablet Take 10 mg by mouth every evening.     . metoprolol succinate (TOPROL XL) 25 MG 24 hr tablet Take 1 tablet (25 mg total) by mouth daily. 30 tablet 1  . montelukast (SINGULAIR) 10 MG tablet TAKE ONE (1) TABLET BY MOUTH EVERY DAY (Patient taking differently: Take 10 mg by mouth every evening. ) 90 tablet 3  . triamcinolone cream (KENALOG) 0.1 % Apply 1 application topically 2 (two) times daily. (Patient taking differently: Apply 1 application topically 2 (two) times daily as needed (skin irritation/bug bites.). ) 45 g 1  . warfarin (COUMADIN) 5 MG tablet Take 1 tablet (5 mg total) by mouth daily at 6 PM. Dose to be titrated based on INR 30 tablet 1  . traMADol (ULTRAM) 50 MG tablet Take 1 tablet (50 mg total) by mouth every 6 (six) hours as needed for moderate pain. (Patient not taking: Reported on 06/01/2019) 30 tablet 0   No current facility-administered medications for this visit.      Physical Exam:   BP (!) 159/88   Pulse 74   Temp 97.6 F (36.4 C) (Skin)   Resp 20   Ht 6\' 2"  (1.88 m)   Wt 202 lb (91.6 kg)   SpO2 98% Comment: RA  BMI 25.94 kg/m   General:  Well-appearing  Chest:   Clear to auscultation  CV:   Regular rate and rhythm without murmur  Incisions:  Healing nicely  Abdomen:  Soft nontender  Extremities:  Warm and well-perfused  Diagnostic Tests:  CHEST - 2 VIEW  COMPARISON:  05/25/2019  FINDINGS: The heart size and mediastinal contours are stable. Atrial appendage clip and prosthetic mitral valve. No focal airspace consolidation, pleural effusion, or pneumothorax. The visualized skeletal structures are unremarkable.  IMPRESSION: No acute cardiopulmonary findings.   Electronically Signed   By: Davina Poke D.O.   On: 06/01/2019 11:16    Impression:  Patient is doing very well less than 2 weeks status post minimally invasive mitral valve repair  Plan:  I have encouraged the patient to continue to gradually increase his physical  activity as tolerated with his primary restrictions that he refrain from heavy lifting or strenuous use of his arms or shoulders for at least the first 6 weeks after surgery.  I think he can return to work whenever he likes but I have suggested that it might be wise to start back on a part-time basis and try not to go back full-time for at least another week or 2.  He may resume driving an automobile.  We have discussed Coumadin management and we will shoot for a goal INR of 2.0.  Patient will continue to take aspirin 81 mg daily until he is therapeutic on warfarin.  He will also remain on low-dose beta-blocker for the time being.  All of his questions have been addressed.  Patient will undergo routine follow-up echocardiogram in approximately 6 to 8 weeks.  He will return to our office in 2 months for routine follow-up.  He will call or return sooner should specific problems or questions arise.    Valentina Gu. Roxy Manns, MD 06/01/2019 11:33 AM

## 2019-06-01 NOTE — Patient Instructions (Addendum)
Continue all previous medications without any changes at this time  You may continue to gradually increase your physical activity as tolerated.  Refrain from any heavy lifting or strenuous use of your arms and shoulders until at least 8 weeks from the time of your surgery, and avoid activities that cause increased pain in your chest on the side of your surgical incision.  Otherwise you may continue to increase activities without any particular limitations.  Increase the intensity and duration of physical activity gradually.  You may return to driving an automobile as long as you are no longer requiring oral narcotic pain relievers during the daytime.  It would be wise to start driving only short distances during the daylight and gradually increase from there as you feel comfortable.  You may return to work with physical limitations as discussed  Endocarditis is a potentially serious infection of heart valves or inside lining of the heart.  It occurs more commonly in patients with diseased heart valves (such as patient's with aortic or mitral valve disease) and in patients who have undergone heart valve repair or replacement.  Certain surgical and dental procedures may put you at risk, such as dental cleaning, other dental procedures, or any surgery involving the respiratory, urinary, gastrointestinal tract, gallbladder or prostate gland.   To minimize your chances for develooping endocarditis, maintain good oral health and seek prompt medical attention for any infections involving the mouth, teeth, gums, skin or urinary tract.    Always notify your doctor or dentist about your underlying heart valve condition before having any invasive procedures. You will need to take antibiotics before certain procedures, including all routine dental cleanings or other dental procedures.  Your cardiologist or dentist should prescribe these antibiotics for you to be taken ahead of time.

## 2019-06-02 NOTE — Telephone Encounter (Signed)
Spoke to pt. He said he will get with Dr Damita Dunnings later this week or next week to discuss when he needs to make appt.

## 2019-06-03 NOTE — Progress Notes (Addendum)
Cardiology Office Note    Date:  06/05/2019   ID:  Ryan Carbon, MD, DOB 24-Aug-1955, MRN [id number]  PCP:  Tonia Ghent, MD  Cardiologist: Dr. 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  . 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  He has no history of rheumatic fever.    History of mitral valve prolapse and mild mitral regurgitation recent dating back to 65.  Most recently established care with Dr. Burt Knack April 29, 2019 for symptomatic mitral valve prolapse with regurgitation.  TEE 05/06/2019 showed severe mitral regurgitation.  Cardiac cath 05/06/2019 showed mild nonobstructive CAD with severe mitral regurgitation with well compassionate care hemodynamics.  Subsequently patient underwent a minimally invasive mitral valve repair with Dr. Roxy Manns on 05/21/2019.  Recovered well.  He was doing well when seen by Dr. Roxy Manns for post hospital follow-up.  Here today for follow up. 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 He has been exercising. No exertional symptoms. No chest pain, shortness of breath, dizziness, LE edema, orthopnea or PND. He is going to check his INR today.    Past Medical History:  Diagnosis Date  . ALLERGIC RHINITIS 11/25/2008  . Allergy   . Asthma    exercised induced and cold induced  . Atypical nevus of scapular region 01/12/2017  . GERD (gastroesophageal reflux disease)    occasional  . Hearing loss, right 04/11/2012   Multiple surgeries in the past, partial hearing loss  . Hematospermia 02/19/2018  . History of brain concussion 01/18/2018  . Hyperlipidemia LDL goal <130 01/10/2016  . Lichen planus [id number]   Qualifier: Diagnosis of  By: Council Mechanic MD, Hilaria Ota   . Mitral regurgitation, myxomatous 04/11/2012  . Mitral valve prolapse  04/11/2012   Severe, annual echos  . S/P minimally-invasive mitral valve repair 05/21/2019   Complex valvuloplasty including triangular resection of flail P1 segment, artificial Gore-tex neochord placement x6, suture plication of P1 and P2 segments of posterior leaflet and 38 mm Sorin Memo 4D ring annuloplasty via right mini thoracotomy approach    Past Surgical History:  Procedure Laterality Date  .  tendon repair quadracep Left 2013  . APPENDECTOMY    . Bilateral laparoscopic hernia repairs  1992  . CLIPPING OF ATRIAL APPENDAGE N/A 05/21/2019   Procedure: CLIPPING OF ATRIAL APPENDAGE USING ATRICURE CLIP SIZE 45MM;  Surgeon: Rexene Alberts, MD;  Location: [place];  Service: Open Heart Surgery;  Laterality: N/A;  . COLONOSCOPY  11/17/07   Dr. Henrene Pastor  . Echo (other)     positive for MVP, markedly myxomatous, marked prolapse TVP with Regurg (03/1990)  . Echo (other)  06/21/2000   mitral valve thickened, myxomatous vs degenerative, moderate MVP, moderate MR  . Echo (other)  07/26/2006   LVF normal, EF 55-65%, Triv AR mod MR, marked posterior leaflet MVP  . Echo (other)  06/15/2009   no change (EF 55-60% Mild Myxomatous Mitral Valve Mod MVP mod MR Mild LAE)  . MITRAL VALVE REPAIR Right 05/21/2019   Procedure: MINIMALLY INVASIVE MITRAL VALVE REPAIR (MVR) USING MEMO 4D MITRAL RING SIZE 38MM;  Surgeon: Rexene Alberts, MD;  Location: [place];  Service: Open Heart Surgery;  Laterality: Right;  . Nasal Septoplasty (other)  2008  Dr. Janace Hoard  . OSSICULAR RECONSTRUCTION     right ear  . RIGHT/LEFT HEART CATH AND CORONARY ANGIOGRAPHY N/A 05/06/2019   Procedure: RIGHT/LEFT HEART CATH AND CORONARY ANGIOGRAPHY;  Surgeon: Sherren Mocha, MD;  Location: [facility];  Service: Cardiovascular;  Laterality: N/A;  . TEE WITHOUT CARDIOVERSION N/A 05/06/2019   Procedure: TRANSESOPHAGEAL ECHOCARDIOGRAM (TEE);  Surgeon: Donato Heinz, MD;  Location: [facility] ENDOSCOPY;  Service: Endoscopy;  Laterality: N/A;  .  TEE WITHOUT CARDIOVERSION N/A 05/21/2019   Procedure: TRANSESOPHAGEAL ECHOCARDIOGRAM (TEE);  Surgeon: Rexene Alberts, MD;  Location: [place];  Service: Open Heart Surgery;  Laterality: N/A;  . TONSILLECTOMY     age 49    Current Medications: Prior to Admission medications   Medication Sig Start Date End Date Taking? Authorizing Provider  acetaminophen (TYLENOL) 500 MG tablet Take 500-1,000 mg by mouth every 6 (six) hours as needed (for pain.).    [provider]  albuterol (PROAIR HFA) 108 (90 Base) MCG/ACT inhaler Inhale 1-2 puffs into the lungs every 6 (six) hours as needed for wheezing or shortness of breath. 01/22/19   Crecencio Mc, MD  aspirin 81 MG EC tablet Take 1 tablet (81 mg total) by mouth daily. 05/25/19   Elgie Collard, PA-C  cetirizine (ZYRTEC ALLERGY) 10 MG tablet Take 10 mg by mouth every evening.     [provider]  esomeprazole (NEXIUM) 20 MG capsule Take 20 mg by mouth as needed.    [provider]  fexofenadine (ALLEGRA) 180 MG tablet Take 180 mg by mouth daily as needed (allergies.).     [provider]  loratadine (CLARITIN) 10 MG tablet Take 10 mg by mouth every evening.     [provider]  metoprolol succinate (TOPROL XL) 25 MG 24 hr tablet Take 1 tablet (25 mg total) by mouth daily. 05/25/19   Elgie Collard, PA-C  montelukast (SINGULAIR) 10 MG tablet TAKE ONE (1) TABLET BY MOUTH EVERY DAY Patient taking differently: Take 10 mg by mouth every evening.  01/22/19   Crecencio Mc, MD  traMADol (ULTRAM) 50 MG tablet Take 1 tablet (50 mg total) by mouth every 6 (six) hours as needed for moderate pain. Patient not taking: Reported on 06/01/2019 05/25/19   Elgie Collard, PA-C  triamcinolone cream (KENALOG) 0.1 % Apply 1 application topically 2 (two) times daily. Patient taking differently: Apply 1 application topically 2 (two) times daily as needed (skin irritation/bug bites.).  01/10/16   Crecencio Mc, MD  warfarin (COUMADIN)  5 MG tablet Take 1 tablet (5 mg total) by mouth daily at 6 PM. Dose to be titrated based on INR 05/25/19   Elgie Collard, PA-C    Allergies:   Patient has no known allergies.   Social History   Socioeconomic History  . Marital status: Married    Spouse name: Manuela Schwartz  . Number of children: 2  . Years of education: Not on file  . Highest education level: Not on file  Occupational History  . 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 Smoking status: Never Smoker  . Smokeless tobacco: Never Used  Substance and Sexual Activity  . Alcohol use: Yes    Alcohol/week: 19.0 standard drinks    Types: 19 Glasses of wine per week  . Drug use: No  . Sexual activity: Yes    Partners: Female  Other Topics Concern  .  Not on file  Social History Narrative   Physician.   Married to Arley   2 Children   Daughter: Vladimir Creeks   1 son, Shanon Brow, married with child   Social Determinants of Health   Financial Resource Strain:   . Difficulty of Paying Living Expenses: Not on file  Food Insecurity:   . Worried About Charity fundraiser in the Last Year: Not on file  . Ran Out of Food in the Last Year: Not on file  Transportation Needs:   . Lack of Transportation (Medical): Not on file  . Lack of Transportation (Non-Medical): Not on file  Physical Activity:   . Days of Exercise per Week: Not on file  . Minutes of Exercise per Session: Not on file  Stress:   . Feeling of Stress : Not on file  Social Connections:   . Frequency of Communication with Friends and Family: Not on file  . Frequency of Social Gatherings with Friends and Family: Not on file  . Attends Religious Services: Not on file  . Active Member of Clubs or Organizations: Not on file  . Attends Archivist Meetings: Not on file  . Marital Status: Not on file     Family History:  The patient's family history includes Breast cancer in his mother; Breast cancer (age of  onset: 14) in his sister; Congestive Heart Failure in his father; Diabetes in his father; Hypertension in his mother; Hyperthyroidism in his sister; Peripheral Artery Disease (age of onset: 12) in his father; Prostate cancer in his brother.   ROS:   Please see the history of present illness.    ROS All other systems reviewed and are negative.   PHYSICAL EXAM:   VS:  BP 120/84   Pulse 75   Ht 6\' 2"  (1.88 m)   Wt 199 lb 6.4 oz (90.4 kg)   SpO2 96%   BMI 25.60 kg/m    GEN: Well nourished, well developed, in no acute distress  HEENT: normal  Neck: no JVD, carotid bruits, or masses Cardiac: RRR; no murmurs, rubs, or gallops,no edema  Respiratory:  clear to auscultation bilaterally, normal work of breathing GI: soft, nontender, nondistended, + BS MS: no deformity or atrophy  Skin: warm and dry, no rash Neuro:  Alert and Oriented x 3, Strength and sensation are intact Psych: euthymic mood, full affect  Wt Readings from Last 3 Encounters:  06/05/19 199 lb 6.4 oz (90.4 kg)  06/01/19 202 lb (91.6 kg)  05/25/19 207 lb 10.8 oz (94.2 kg)      Studies/Labs Reviewed:   EKG:  EKG is not ordered today.    Recent Labs: 05/19/2019: ALT 38 05/22/2019: Magnesium 2.4 05/24/2019: BUN 11; Creatinine, Ser 0.78; Hemoglobin 10.6; Platelets 150; Potassium 4.5; Sodium 137   Lipid Panel    Component Value Date/Time   CHOL 230 (H) 12/24/2018 1241   TRIG 133 12/24/2018 1241   HDL 75 12/24/2018 1241   CHOLHDL 3.1 12/24/2018 1241   VLDL 22.6 04/10/2012 1250   LDLCALC 130 (H) 12/24/2018 1241   LDLDIRECT 130.1 04/10/2012 1250    Additional studies/ records that were reviewed today include:   RIGHT/LEFT HEART CATH AND CORONARY ANGIOGRAPHY  05/06/19  Conclusion    Prox Cx to Mid Cx lesion is 20% stenosed.   1. Mild nonobstructive CAD with mild stenosis of the mid-LAD and mid-LCx, no more than 30% vessel narrowing in either vessel 2. Normal right heart pressures, normal LVEDP/PCWP 3. Known  severe  mitral regurgitation by echo assessment, with well-compensated hemodynamics  Plan: cardiac surgical evaluation for mitral valve repair   Echo 03/17/19 1. Left ventricular ejection fraction, by visual estimation, is 60 to  65%. The left ventricle has normal function. There is mildly increased  left ventricular hypertrophy.  2. The left ventricle has no regional wall motion abnormalities.  3. Global right ventricle has normal systolic function.The right  ventricular size is normal. No increase in right ventricular wall  thickness.  4. Left atrial size was severely dilated.  5. Right atrial size was normal.  6. Severe mitral valve prolapse involving the posterior leaflet.  7. The mitral valve is myxomatous. Moderate to severe mitral valve  regurgitation. No evidence of mitral stenosis.  8. Degree of mitral regurgitation is difficult to assess due to jet  eccentricity. Consider further evaluation with transesophageal  echocardiogram, as clinically indicated.  9. The tricuspid valve is grossly normal. Tricuspid valve regurgitation  mild-moderate.  10. The aortic valve is tricuspid. Aortic valve regurgitation is mild. No  evidence of aortic valve sclerosis or stenosis.  11. The pulmonic valve was normal in structure. Pulmonic valve  regurgitation is not visualized.  12. Normal pulmonary artery systolic pressure.  13. The inferior vena cava is dilated in size with >50% respiratory  variability, suggesting right atrial pressure of 8 mmHg.  14. The interatrial septum was not well visualized.     ASSESSMENT & PLAN:    1. s/p minimally invasive mitral valve repair for MVP and severe MR - Recovering well. Get echo prior to appointment with Dr. Roxy Manns in May. Continue BB. Reviewed SBE prophylasix.  - Dr. Silvio Pate likes his INR around 1.8. Per Dr. Roxy Manns "We have discussed Coumadin management and we will shoot for a goal INR of 2.0.  Patient will continue to take aspirin 81 mg daily  until he is therapeutic on warfarin". Reviewed recommendations. He is going to check his INR today at his practice.     Medication Adjustments/Labs and Tests Ordered: Current medicines are reviewed at length with the patient today.  Concerns regarding medicines are outlined above.  Medication changes, Labs and Tests ordered today are listed in the Patient Instructions below. Patient Instructions  Medication Instructions:   Your physician recommends that you continue on your current medications as directed. Please refer to the Current Medication list given to you today.   *If you need a refill on your cardiac medications before your next appointment, please call your pharmacy*   Lab Work: [place]   If you have labs (blood work) drawn today and your tests are completely normal, you will receive your results only by: Marland Kitchen MyChart Message (if you have MyChart) OR . A paper copy in the mail If you have any lab test that is abnormal or we need to change your treatment, we will call you to review the results.   Testing/Procedures:  IN 6 TO 8 WEEKS  Your physician has requested that you have an echocardiogram. Echocardiography is a painless test that uses sound waves to create images of your heart. It provides your doctor with information about the size and shape of your heart and how well your heart's chambers and valves are working. This procedure takes approximately one hour. There are no restrictions for this procedure.   Follow-Up: At [facility], you and your health needs are our priority.  As part of our continuing mission to provide you with exceptional heart care, we have created designated Provider  Care Teams.  These Care Teams include your primary Cardiologist (physician) and Advanced Practice Providers (APPs -  Physician Assistants and Nurse Practitioners) who all work together to provide you with the care you need, when you need it.  We recommend signing up for the  patient portal called "MyChart".  Sign up information is provided on this After Visit Summary.  MyChart is used to connect with patients for Virtual Visits (Telemedicine).  Patients are able to view lab/test results, encounter notes, upcoming appointments, etc.  Non-urgent messages can be sent to your provider as well.   To learn more about what you can do with MyChart, go to [email or web address].    Your next appointment:   6 month(s)  The format for your next appointment:   In Person  Provider:   You may see Dr.Cooper  or one of the following Advanced Practice Providers on your designated Care Team:    Or Bonney Leitz PA-C   Other Instructions        Signed, Leanor Kail, [place]  06/05/2019 9:33 AM    [address], [place], [place]  [address] Phone: [phone]; Fax: [phone]

## 2019-06-05 ENCOUNTER — Ambulatory Visit: Payer: 59 | Admitting: Physician Assistant

## 2019-06-05 ENCOUNTER — Other Ambulatory Visit: Payer: Self-pay

## 2019-06-05 ENCOUNTER — Encounter: Payer: Self-pay | Admitting: Physician Assistant

## 2019-06-05 ENCOUNTER — Ambulatory Visit (INDEPENDENT_AMBULATORY_CARE_PROVIDER_SITE_OTHER): Payer: 59

## 2019-06-05 VITALS — BP 120/84 | HR 75 | Ht 74.0 in | Wt 199.4 lb

## 2019-06-05 DIAGNOSIS — Z9889 Other specified postprocedural states: Secondary | ICD-10-CM | POA: Diagnosis not present

## 2019-06-05 DIAGNOSIS — I340 Nonrheumatic mitral (valve) insufficiency: Secondary | ICD-10-CM

## 2019-06-05 DIAGNOSIS — I341 Nonrheumatic mitral (valve) prolapse: Secondary | ICD-10-CM | POA: Diagnosis not present

## 2019-06-05 DIAGNOSIS — Z7901 Long term (current) use of anticoagulants: Secondary | ICD-10-CM

## 2019-06-05 LAB — POCT INR: INR: 2.1 (ref 2.0–3.0)

## 2019-06-05 NOTE — Patient Instructions (Addendum)
Pre visit review using our clinic review tool, if applicable. No additional management support is needed unless otherwise documented below in the visit note.  Continue 5 mg daily. Recheck in one week.

## 2019-06-05 NOTE — Patient Instructions (Addendum)
Medication Instructions:   Your physician recommends that you continue on your current medications as directed. Please refer to the Current Medication list given to you today.   *If you need a refill on your cardiac medications before your next appointment, please call your pharmacy*   Lab Work: [place]   If you have labs (blood work) drawn today and your tests are completely normal, you will receive your results only by: Marland Kitchen MyChart Message (if you have MyChart) OR . A paper copy in the mail If you have any lab test that is abnormal or we need to change your treatment, we will call you to review the results.   Testing/Procedures:  IN 6 TO 8 WEEKS  Your physician has requested that you have an echocardiogram. Echocardiography is a painless test that uses sound waves to create images of your heart. It provides your doctor with information about the size and shape of your heart and how well your heart's chambers and valves are working. This procedure takes approximately one hour. There are no restrictions for this procedure.   Follow-Up: At [facility], you and your health needs are our priority.  As part of our continuing mission to provide you with exceptional heart care, we have created designated Provider Care Teams.  These Care Teams include your primary Cardiologist (physician) and Advanced Practice Providers (APPs -  Physician Assistants and Nurse Practitioners) who all work together to provide you with the care you need, when you need it.  We recommend signing up for the patient portal called "MyChart".  Sign up information is provided on this After Visit Summary.  MyChart is used to connect with patients for Virtual Visits (Telemedicine).  Patients are able to view lab/test results, encounter notes, upcoming appointments, etc.  Non-urgent messages can be sent to your provider as well.   To learn more about what you can do with MyChart, go to [email or web address].     Your next appointment:   6 month(s)  The format for your next appointment:   In Person  Provider:   You may see Dr.Cooper  or one of the following Advanced Practice Providers on your designated Care Team:    Or Bonney Leitz PA-C   Other Instructions

## 2019-06-07 NOTE — Progress Notes (Signed)
Agree. Thanks

## 2019-06-12 ENCOUNTER — Ambulatory Visit (INDEPENDENT_AMBULATORY_CARE_PROVIDER_SITE_OTHER): Payer: 59

## 2019-06-12 DIAGNOSIS — Z5181 Encounter for therapeutic drug level monitoring: Secondary | ICD-10-CM | POA: Diagnosis not present

## 2019-06-12 DIAGNOSIS — Z7901 Long term (current) use of anticoagulants: Secondary | ICD-10-CM | POA: Diagnosis not present

## 2019-06-12 LAB — POCT INR: INR: 2.2 (ref 2.0–3.0)

## 2019-06-12 NOTE — Patient Instructions (Addendum)
Pre visit review using our clinic review tool, if applicable. No additional management support is needed unless otherwise documented below in the visit note.  Continue 5 mg daily. Recheck in one week.

## 2019-06-14 NOTE — Progress Notes (Signed)
Agree. Thanks

## 2019-06-19 ENCOUNTER — Ambulatory Visit (INDEPENDENT_AMBULATORY_CARE_PROVIDER_SITE_OTHER): Payer: 59

## 2019-06-19 ENCOUNTER — Other Ambulatory Visit: Payer: Self-pay

## 2019-06-19 DIAGNOSIS — Z7901 Long term (current) use of anticoagulants: Secondary | ICD-10-CM

## 2019-06-19 DIAGNOSIS — Z5181 Encounter for therapeutic drug level monitoring: Secondary | ICD-10-CM | POA: Diagnosis not present

## 2019-06-19 LAB — POCT INR: INR: 2.3 (ref 2.0–3.0)

## 2019-06-19 NOTE — Patient Instructions (Addendum)
Pre visit review using our clinic review tool, if applicable. No additional management support is needed unless otherwise documented below in the visit note.  Continue 5 mg daily. Recheck in two weeks.

## 2019-06-22 ENCOUNTER — Ambulatory Visit: Payer: 59 | Admitting: Thoracic Surgery (Cardiothoracic Vascular Surgery)

## 2019-07-06 ENCOUNTER — Ambulatory Visit: Payer: 59

## 2019-07-07 ENCOUNTER — Ambulatory Visit (INDEPENDENT_AMBULATORY_CARE_PROVIDER_SITE_OTHER): Payer: 59

## 2019-07-07 DIAGNOSIS — Z9889 Other specified postprocedural states: Secondary | ICD-10-CM | POA: Diagnosis not present

## 2019-07-07 LAB — POCT INR: INR: 2 (ref 2.0–3.0)

## 2019-07-07 NOTE — Patient Instructions (Addendum)
Pre visit review using our clinic review tool, if applicable. No additional management support is needed unless otherwise documented below in the visit note.  Continue 5 mg daily. Recheck in three weeks.

## 2019-07-08 NOTE — Progress Notes (Signed)
Agree. Thanks

## 2019-07-16 ENCOUNTER — Other Ambulatory Visit: Payer: Self-pay

## 2019-07-16 ENCOUNTER — Encounter: Payer: Self-pay | Admitting: Family Medicine

## 2019-07-16 ENCOUNTER — Ambulatory Visit (INDEPENDENT_AMBULATORY_CARE_PROVIDER_SITE_OTHER): Payer: 59 | Admitting: Family Medicine

## 2019-07-16 VITALS — BP 140/82 | HR 91 | Temp 97.2°F | Ht 74.0 in | Wt 202.0 lb

## 2019-07-16 DIAGNOSIS — Z0000 Encounter for general adult medical examination without abnormal findings: Secondary | ICD-10-CM

## 2019-07-16 DIAGNOSIS — Z9889 Other specified postprocedural states: Secondary | ICD-10-CM

## 2019-07-16 DIAGNOSIS — Z7189 Other specified counseling: Secondary | ICD-10-CM

## 2019-07-16 DIAGNOSIS — I708 Atherosclerosis of other arteries: Secondary | ICD-10-CM

## 2019-07-16 DIAGNOSIS — E041 Nontoxic single thyroid nodule: Secondary | ICD-10-CM

## 2019-07-16 MED ORDER — FLUTICASONE PROPIONATE 50 MCG/ACT NA SUSP: 2.0000 | Freq: Every day | NASAL | Status: AC

## 2019-07-16 NOTE — Progress Notes (Signed)
This visit occurred during the SARS-CoV-2 public health emergency.  Safety protocols were in place, including screening questions prior to the visit, additional usage of staff PPE, and extensive cleaning of exam room while observing appropriate contact time as indicated for disinfecting solutions.  CPE- See plan.  Routine anticipatory guidance given to patient.  See health maintenance.  The possibility exists that previously documented standard health maintenance information may have been brought forward from a previous encounter into this note.  If needed, that same information has been updated to reflect the current situation based on today's encounter.    Tetanus 2019 Flu yearly PNA due at 53.   Shingrix 2020 covid vaccine 2021 PSA screening d/w pt.  PSA wnl 2020.   Colonoscopy 2019.  Living will d/w pt.  Wife designated if patient were incapacitated.  Then daughter designated if patient were incapacitated.   Diet and exercise d/w pt.    Defer statin at this point given recent cath results.    Recent cath result:  1. Mild nonobstructive CAD with mild stenosis of the mid-LAD and mid-LCx, no more than 30% vessel narrowing in either vessel 2. Normal right heart pressures, normal LVEDP/PCWP 3. Known severe mitral regurgitation by echo assessment, with well-compensated hemodynamics  Sig spring allergies, less problematic later in the year.  Dog pet dander was an issue.  Still on singulair with relief along with antihistamines and flonase.  Compliant.  Still on coumadin and metoprolol after mitral valve surgery.  He has f/u pending. He is back to walking at baseline.  He isn't back on his bike yet.  He is walking 15 minutes miles but he feels better in the meantime.  No chest pain.  Not short of breath.  No lower extremity edema.  No heart racing noted.  Prev eval done re: celiac artery:  Celiac: Downward sloping celiac artery with significant narrowing at the origin and 1 slice where no  definite contrast material can be identified consistent with either a critical stenosis, or focal occlusion. The configuration is suggestive of advanced median arcuate ligament compression rather than atherosclerotic vascular calcifications. Marked hypertrophy of the pancreaticoduodenal arcade. The celiac artery likely is supplied retrograde via the SMA.  D/w pt about the above and in absence of sx, then likely no tx needed.    Thyroid nodule d/w pt.   Approximately 1.6 cm low-attenuation thyroid nodule or cyst in the left gland. Recommend dedicated thyroid ultrasound for further characterization.  He does not have symptoms of neck mass or other concerning symptoms  PMH and SH reviewed  Meds, vitals, and allergies reviewed.   ROS: Per HPI.  Unless specifically indicated otherwise in HPI, the patient denies:  General: fever. Eyes: acute vision changes ENT: sore throat Cardiovascular: chest pain Respiratory: SOB GI: vomiting GU: dysuria Musculoskeletal: acute back pain Derm: acute rash Neuro: acute motor dysfunction Psych: worsening mood Endocrine: polydipsia Heme: bleeding Allergy: hayfever  GEN: nad, alert and oriented HEENT: ncat NECK: supple w/o LA CV: rrr.  No murmur appreciated. PULM: ctab, no inc wob ABD: soft, +bs EXT: no edema SKIN: no acute rash  As a separate issue he had some left scapular pain recently but no symptoms at time of exam.  No exertional symptoms.  It was not tender at the time of exam.  I checked with the patient the day after the exam and all symptoms were still absent.  He will update me as needed.

## 2019-07-16 NOTE — Patient Instructions (Signed)
Update me as needed.   We'll call about the thyroid ultrasound and we'll go from there.  Take care.  Glad to see you.

## 2019-07-17 ENCOUNTER — Other Ambulatory Visit: Payer: Self-pay | Admitting: Physician Assistant

## 2019-07-17 ENCOUNTER — Other Ambulatory Visit: Payer: Self-pay | Admitting: Family Medicine

## 2019-07-17 ENCOUNTER — Other Ambulatory Visit: Payer: Self-pay | Admitting: Cardiovascular Disease

## 2019-07-17 NOTE — Telephone Encounter (Signed)
Pt compliant with coumadin management. Sent in script for 30 days per Dr. Damita Dunnings.

## 2019-07-19 ENCOUNTER — Encounter: Payer: Self-pay | Admitting: Family Medicine

## 2019-07-19 DIAGNOSIS — Z7189 Other specified counseling: Secondary | ICD-10-CM | POA: Insufficient documentation

## 2019-07-19 NOTE — Assessment & Plan Note (Signed)
No further work-up needed at this point.  Discussed with patient.  He agrees.

## 2019-07-19 NOTE — Assessment & Plan Note (Signed)
Follow-up imaging pending.

## 2019-07-19 NOTE — Assessment & Plan Note (Signed)
Living will d/w pt.  Wife designated if patient were incapacitated.  Then daughter designated if patient were incapacitated.

## 2019-07-19 NOTE — Assessment & Plan Note (Signed)
Clinically improved.  Exercise tolerance is improving.  No chest pain.  He has routine follow-up scheduled.  Still temporarily anticoagulated in the meantime without any bleeding.  Continue as is for now.  He agrees.  He has routine cardiology follow-up and he will update me as needed.

## 2019-07-19 NOTE — Assessment & Plan Note (Signed)
  Tetanus 2019 Flu yearly PNA due at 51.   Shingrix 2020 covid vaccine 2021 PSA screening d/w pt.  PSA wnl 2020.   Colonoscopy 2019.  Living will d/w pt.  Wife designated if patient were incapacitated.  Then daughter designated if patient were incapacitated.   Diet and exercise d/w pt.

## 2019-07-28 ENCOUNTER — Ambulatory Visit
Admission: RE | Admit: 2019-07-28 | Discharge: 2019-07-28 | Disposition: A | Discharge status: Home / Self Care | Payer: 59 | Source: Ambulatory Visit | Attending: Family Medicine | Admitting: Family Medicine

## 2019-07-28 ENCOUNTER — Ambulatory Visit (INDEPENDENT_AMBULATORY_CARE_PROVIDER_SITE_OTHER): Payer: 59

## 2019-07-28 ENCOUNTER — Other Ambulatory Visit: Payer: Self-pay

## 2019-07-28 DIAGNOSIS — E041 Nontoxic single thyroid nodule: Secondary | ICD-10-CM | POA: Insufficient documentation

## 2019-07-28 DIAGNOSIS — Z7901 Long term (current) use of anticoagulants: Secondary | ICD-10-CM | POA: Diagnosis not present

## 2019-07-28 LAB — POCT INR: INR: 1.9 — AB (ref 2.0–3.0)

## 2019-07-28 NOTE — Patient Instructions (Addendum)
Pre visit review using our clinic review tool, if applicable. No additional management support is needed unless otherwise documented below in the visit note.  Continue 5 mg daily. Recheck in 4 weeks.

## 2019-07-29 ENCOUNTER — Other Ambulatory Visit: Payer: Self-pay | Admitting: Family Medicine

## 2019-07-29 ENCOUNTER — Ambulatory Visit (HOSPITAL_COMMUNITY): Payer: 59 | Attending: Cardiovascular Disease

## 2019-07-29 ENCOUNTER — Telehealth: Payer: Self-pay | Admitting: Family Medicine

## 2019-07-29 DIAGNOSIS — I341 Nonrheumatic mitral (valve) prolapse: Secondary | ICD-10-CM | POA: Diagnosis not present

## 2019-07-29 DIAGNOSIS — E041 Nontoxic single thyroid nodule: Secondary | ICD-10-CM

## 2019-07-29 NOTE — Telephone Encounter (Signed)
Spoke with [name] as I'm covering for Dr Damita Dunnings today - moderately suspicious thyroid nodule needs biopsy - will order thyroid biopsy.   Spoke with [name] - imaging center will place order and send to me to sign.

## 2019-07-30 ENCOUNTER — Other Ambulatory Visit: Payer: Self-pay | Admitting: Family Medicine

## 2019-07-30 DIAGNOSIS — E041 Nontoxic single thyroid nodule: Secondary | ICD-10-CM

## 2019-08-03 ENCOUNTER — Encounter: Payer: Self-pay | Admitting: Thoracic Surgery (Cardiothoracic Vascular Surgery)

## 2019-08-03 ENCOUNTER — Other Ambulatory Visit: Payer: Self-pay

## 2019-08-03 ENCOUNTER — Ambulatory Visit (INDEPENDENT_AMBULATORY_CARE_PROVIDER_SITE_OTHER): Payer: Self-pay | Admitting: Thoracic Surgery (Cardiothoracic Vascular Surgery)

## 2019-08-03 VITALS — BP 148/85 | HR 94 | Temp 98.2°F | Resp 20 | Ht 74.0 in | Wt 198.0 lb

## 2019-08-03 DIAGNOSIS — Z9889 Other specified postprocedural states: Secondary | ICD-10-CM

## 2019-08-03 MED ORDER — ASPIRIN EC 81 MG PO TBEC: 81.0000 mg | DELAYED_RELEASE_TABLET | Freq: Every day | ORAL | Status: DC

## 2019-08-03 MED ORDER — METOPROLOL SUCCINATE ER 25 MG PO TB24: 25.0000 mg | ORAL_TABLET | Freq: Every day | ORAL | 1 refills | Status: DC

## 2019-08-03 NOTE — Patient Instructions (Addendum)
You may stop taking warfarin but resume aspirin  You may resume unrestricted physical activity without any particular limitations at this time.  Endocarditis is a potentially serious infection of heart valves or inside lining of the heart.  It occurs more commonly in patients with diseased heart valves (such as patient's with aortic or mitral valve disease) and in patients who have undergone heart valve repair or replacement.  Certain surgical and dental procedures may put you at risk, such as dental cleaning, other dental procedures, or any surgery involving the respiratory, urinary, gastrointestinal tract, gallbladder or prostate gland.   To minimize your chances for develooping endocarditis, maintain good oral health and seek prompt medical attention for any infections involving the mouth, teeth, gums, skin or urinary tract.    Always notify your doctor or dentist about your underlying heart valve condition before having any invasive procedures. You will need to take antibiotics before certain procedures, including all routine dental cleanings or other dental procedures.  Your cardiologist or dentist should prescribe these antibiotics for you to be taken ahead of time.

## 2019-08-03 NOTE — Progress Notes (Signed)
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  His postoperative recovery has been uncomplicated.  He was last seen here in our office on June 01, 2019.  He reports that since then he has continued to do very well.  He no longer has any significant pain in his chest and his exercise tolerance continues to improve.  He has noted that his baseline resting pulse is considerably higher than it was prior to surgery.  He is eager to start pushing himself more physically and specifically asked about resuming cycling.  He remains anticoagulated using warfarin at this time but he is hopeful to stop soon.  He has not had any palpitations or dizzy spells.   Current Outpatient Medications  Medication Sig Dispense Refill  . acetaminophen (TYLENOL) 500 MG tablet Take 500-1,000 mg by mouth every 6 (six) hours as needed (for pain.).    Marland Kitchen albuterol (PROAIR HFA) 108 (90 Base) MCG/ACT inhaler Inhale 1-2 puffs into the lungs every 6 (six) hours as needed for wheezing or shortness of breath. 8 g 2  . cetirizine (ZYRTEC ALLERGY) 10 MG tablet Take 10 mg by mouth every evening.     Marland Kitchen esomeprazole (NEXIUM) 20 MG capsule Take 20 mg by mouth as needed.    . fexofenadine (ALLEGRA) 180 MG tablet Take 180 mg by mouth daily as needed (allergies.).     Marland Kitchen fluticasone (FLONASE) 50 MCG/ACT nasal spray Place 2 sprays into both nostrils daily.    Marland Kitchen loratadine (CLARITIN) 10 MG tablet Take 10 mg by mouth every evening.     . metoprolol succinate (TOPROL-XL) 25 MG 24 hr tablet TAKE 1 TABLET BY MOUTH DAILY. 90 tablet 1  . montelukast (SINGULAIR) 10 MG tablet TAKE ONE (1) TABLET BY MOUTH EVERY DAY 90  tablet 3  . triamcinolone cream (KENALOG) 0.1 % Apply 1 application topically 2 (two) times daily. 45 g 1  . warfarin (COUMADIN) 5 MG tablet TAKE 1 TABLET BY MOUTH DAILY AT 6 PM. DOSE TO BE TITRATED BASED ON INR 30 tablet 0   No current facility-administered medications for this visit.      Physical Exam:   BP (!) 148/85 (BP Location: Left Arm)   Pulse 94   Temp 98.2 F (36.8 C) (Temporal)   Resp 20   Ht 6\' 2"  (1.88 m)   Wt 198 lb (89.8 kg)   SpO2 98% Comment: RA  BMI 25.42 kg/m   General:  Well-appearing  Chest:   Clear to auscultation with symmetrical breath sounds  CV:   Regular rate and rhythm without murmur  Incisions:  Well-healed  Abdomen:  Soft nontender  Extremities:  Warm and well-perfused  Diagnostic Tests:  TRANSTHORACIC ECHOCARDIOGRAM  I have personally reviewed images from follow-up transthoracic echocardiogram performed July 29, 2019.  The echocardiogram has not yet formally been interpreted by a cardiologist.  Mitral valve repair appears intact with mild residual mitral regurgitation.  There is no significant systolic anterior motion of the mitral valve.  Mean transvalvular gradient across the mitral valve was estimated 6 mmHg.  Left  ventricular systolic function appears normal.   Impression:  Patient is doing well nearly 3 months status post minimally invasive mitral valve repair  Plan:  I have instructed the patient to stop taking warfarin but resume aspirin 81 mg daily.  He will continue on metoprolol at his current dose.  The patient has been advised that he may continue to gradually increase his physical activity without any particular limitations.  I have suggested that he should be mindful of checking his pulse during strenuous exercise and not pushing himself to the point of persistently elevated heart rates above 130 to 140 bpm.  Patient will return to our office for routine follow-up next February, approximately 1 year following his surgery.  He will  call and return sooner should specific problems or questions arise.    Valentina Gu. Roxy Manns, MD 08/03/2019 11:43 AM

## 2019-08-04 ENCOUNTER — Ambulatory Visit: Payer: Self-pay

## 2019-08-04 NOTE — Progress Notes (Signed)
Agree. Thanks

## 2019-08-12 ENCOUNTER — Telehealth: Payer: Self-pay | Admitting: *Deleted

## 2019-08-12 ENCOUNTER — Ambulatory Visit
Admission: RE | Admit: 2019-08-12 | Discharge: 2019-08-12 | Disposition: A | Discharge status: Home / Self Care | Payer: 59 | Source: Ambulatory Visit | Attending: Family Medicine | Admitting: Family Medicine

## 2019-08-12 ENCOUNTER — Telehealth: Payer: Self-pay | Admitting: Cardiovascular Disease

## 2019-08-12 ENCOUNTER — Other Ambulatory Visit (HOSPITAL_COMMUNITY)
Admission: RE | Admit: 2019-08-12 | Discharge: 2019-08-12 | Disposition: A | Discharge status: Home / Self Care | Payer: 59 | Source: Ambulatory Visit | Attending: Family Medicine | Admitting: Family Medicine

## 2019-08-12 DIAGNOSIS — E041 Nontoxic single thyroid nodule: Secondary | ICD-10-CM | POA: Diagnosis not present

## 2019-08-12 NOTE — Telephone Encounter (Signed)
Patient returning [name]. Call in regards to echo results.

## 2019-08-12 NOTE — Telephone Encounter (Signed)
Returned call to pt.  Pt has been made aware of his echocardiogram results. Pt was asking what took his echocardiogram so long to be read by our office?  He states that he had already followed back up with Dr. Ricard Dillon and he already went over it with him, but was curious why the 8 day delay in it being read here? (pt is a md)  He stated that he really didn't need an answer, but wanted to bring it to someone's attention that this has happened.   Will route to Marketing executive / Publishing copy to make them aware.

## 2019-08-12 NOTE — Telephone Encounter (Signed)
error 

## 2019-08-12 NOTE — Telephone Encounter (Signed)
-----   Message from [place], [place] sent at 08/06/2019  2:13 PM EDT ----- Normal pumping function of heart. Normal function mitral valve with mild regurgitation.

## 2019-08-13 LAB — CYTOLOGY - NON PAP

## 2019-09-11 ENCOUNTER — Encounter: Payer: Self-pay | Admitting: Thoracic Surgery (Cardiothoracic Vascular Surgery)

## 2019-11-13 ENCOUNTER — Telehealth: Payer: Self-pay

## 2019-11-13 NOTE — Telephone Encounter (Signed)
Pt is about to have dental work and needs pre-procedure antibiotics due to his valve replacement. Send to [facility]. Pt is asking for #12 and a refill if possible.

## 2019-11-16 MED ORDER — AMOXICILLIN 500 MG PO CAPS: 2000.0000 mg | ORAL_CAPSULE | Freq: Once | ORAL | 1 refills | Status: AC

## 2019-11-16 NOTE — Addendum Note (Signed)
Addended by: Tonia Ghent on: 11/16/2019 11:07 AM   Modules accepted: Orders

## 2019-11-16 NOTE — Telephone Encounter (Signed)
Sent. Thanks.   

## 2020-02-04 ENCOUNTER — Encounter: Payer: BC Managed Care – PPO | Admitting: Internal Medicine

## 2020-03-14 ENCOUNTER — Other Ambulatory Visit: Payer: Self-pay

## 2020-03-14 MED ORDER — MONTELUKAST SODIUM 10 MG PO TABS: ORAL_TABLET | ORAL | 3 refills | Status: DC

## 2020-05-16 ENCOUNTER — Ambulatory Visit: Payer: 59 | Admitting: Thoracic Surgery (Cardiothoracic Vascular Surgery)

## 2020-05-25 ENCOUNTER — Other Ambulatory Visit: Payer: Self-pay

## 2020-05-25 ENCOUNTER — Encounter: Payer: Self-pay | Admitting: Cardiovascular Disease

## 2020-05-25 ENCOUNTER — Ambulatory Visit: Payer: No Typology Code available for payment source | Admitting: Cardiovascular Disease

## 2020-05-25 VITALS — BP 134/80 | HR 96 | Ht 74.0 in | Wt 206.2 lb

## 2020-05-25 DIAGNOSIS — Z9889 Other specified postprocedural states: Secondary | ICD-10-CM

## 2020-05-25 NOTE — Patient Instructions (Signed)
Medication Instructions:  Your provider recommends that you continue on your current medications as directed. Please refer to the Current Medication list given to you today.   *If you need a refill on your cardiac medications before your next appointment, please call your pharmacy*  Follow-Up: You will be contacted to schedule your 1 year echo and office visit.

## 2020-05-25 NOTE — Progress Notes (Signed)
Cardiology Office Note:    Date:  05/25/2020   ID:  Ryan Carbon, MD, DOB [birth year], MRN [id number]  PCP:  Tonia Ghent, MD   [facility]  Cardiologist:  No primary care provider on file.  Advanced Practice Provider:  No care team member to display Electrophysiologist:  None       Referring MD: Tonia Ghent, MD   Chief Complaint  Patient presents with  . 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  The patient was treated with a complex repair including placement of a 38 mm Sorin annuloplasty ring via a right minithoracotomy approach.  His postoperative echo showed no residual mitral regurgitation and a transmitral mean gradient of 6 mmHg.  The patient is here alone today.  He is doing well.  He is able to exercise pretty vigorously with minimal limitation.  He does have mild shortness of breath at the beginning of his walk.  He has done some difficult hikes and was able to tolerate this with periodic rest.  His walking pace averages greater than 4 mph.  He denies orthopnea, PND, leg swelling, or chest pain.  He follows SBE prophylaxis per guidelines.  He is currently taking aspirin every other day.  He was treated with a beta-blocker for a period of time, but he did not appreciate much of a decrease in his resting heart rate and he felt that his heart rate with physical exertion was blunted.  Past Medical History:  Diagnosis Date  . ALLERGIC RHINITIS 11/25/2008  . Allergy   . Asthma    exercised induced and cold induced  . Atypical nevus of scapular region 01/12/2017  . GERD (gastroesophageal reflux disease)    occasional  . Hearing loss, right 04/11/2012   Multiple surgeries in the past, partial hearing loss  . Hematospermia 02/19/2018  . History of brain  concussion 01/18/2018  . Hyperlipidemia LDL goal <130 01/10/2016  . Lichen planus 07/02/270   Qualifier: Diagnosis of  By: Council Mechanic MD, Hilaria Ota   . Mitral regurgitation, myxomatous 04/11/2012  . Mitral valve prolapse 04/11/2012   Severe, annual echos  . S/P minimally-invasive mitral valve repair 05/21/2019   Complex valvuloplasty including triangular resection of flail P1 segment, artificial Gore-tex neochord placement x6, suture plication of P1 and P2 segments of posterior leaflet and 38 mm Sorin Memo 4D ring annuloplasty via right mini thoracotomy approach    Past Surgical History:  Procedure Laterality Date  .  tendon repair quadracep Left 2013  . APPENDECTOMY    . Bilateral laparoscopic hernia repairs  1992  . CLIPPING OF ATRIAL APPENDAGE N/A 05/21/2019   Procedure: CLIPPING OF ATRIAL APPENDAGE USING ATRICURE CLIP SIZE 45MM;  Surgeon: Rexene Alberts, MD;  Location: [facility];  Service: Open Heart Surgery;  Laterality: N/A;  . COLONOSCOPY  11/17/07   Dr. Henrene Pastor  . Echo (other)     positive for MVP, markedly myxomatous, marked prolapse TVP with Regurg (03/1990)  . Echo (other)  06/21/2000   mitral valve thickened, myxomatous vs degenerative, moderate MVP, moderate MR  . Echo (other)  07/26/2006   LVF normal, EF 55-65%, Triv AR mod MR, marked posterior leaflet MVP  . Echo (other)  06/15/2009   no change (EF 55-60% Mild Myxomatous Mitral  Valve Mod MVP mod MR Mild LAE)  . MITRAL VALVE REPAIR Right 05/21/2019   Procedure: MINIMALLY INVASIVE MITRAL VALVE REPAIR (MVR) USING MEMO 4D MITRAL RING SIZE 38MM;  Surgeon: Rexene Alberts, MD;  Location: [facility];  Service: Open Heart Surgery;  Laterality: Right;  . Nasal Septoplasty (other)  2008   Dr. Janace Hoard  . OSSICULAR RECONSTRUCTION     right ear  . RIGHT/LEFT HEART CATH AND CORONARY ANGIOGRAPHY N/A 05/06/2019   Procedure: RIGHT/LEFT HEART CATH AND CORONARY ANGIOGRAPHY;  Surgeon: Sherren Mocha, MD;  Location: [facility] CV [facility];  Service:  Cardiovascular;  Laterality: N/A;  . STAPEDECTOMY    . TEE WITHOUT CARDIOVERSION N/A 05/06/2019   Procedure: TRANSESOPHAGEAL ECHOCARDIOGRAM (TEE);  Surgeon: Donato Heinz, MD;  Location: [facility] ENDOSCOPY;  Service: Endoscopy;  Laterality: N/A;  . TEE WITHOUT CARDIOVERSION N/A 05/21/2019   Procedure: TRANSESOPHAGEAL ECHOCARDIOGRAM (TEE);  Surgeon: Rexene Alberts, MD;  Location: [facility];  Service: Open Heart Surgery;  Laterality: N/A;  . TONSILLECTOMY     age 72    Current Medications: Current Meds  Medication Sig  . acetaminophen (TYLENOL) 500 MG tablet Take 500-1,000 mg by mouth every 6 (six) hours as needed (for pain.).  Marland Kitchen albuterol (PROAIR HFA) 108 (90 Base) MCG/ACT inhaler Inhale 1-2 puffs into the lungs every 6 (six) hours as needed for wheezing or shortness of breath.  . cetirizine (ZYRTEC) 10 MG tablet Take 10 mg by mouth every evening.  Marland Kitchen esomeprazole (NEXIUM) 20 MG capsule Take 20 mg by mouth as needed.  . fexofenadine (ALLEGRA) 180 MG tablet Take 180 mg by mouth daily as needed (allergies.).  Marland Kitchen fluticasone (FLONASE) 50 MCG/ACT nasal spray Place 2 sprays into both nostrils daily.  Marland Kitchen loratadine (CLARITIN) 10 MG tablet Take 10 mg by mouth every evening.  . montelukast (SINGULAIR) 10 MG tablet TAKE ONE (1) TABLET BY MOUTH EVERY DAY  . triamcinolone cream (KENALOG) 0.1 % Apply 1 application topically 2 (two) times daily as needed.   Current Facility-Administered Medications for the 05/25/20 encounter (Office Visit) with Sherren Mocha, MD  Medication  . aspirin EC tablet 81 mg     Allergies:   Patient has no known allergies.   Social History   Socioeconomic History  . Marital status: Married    Spouse name: Manuela Schwartz  . Number of children: 2  . Years of education: Not on file  . Highest education level: Not on file  Occupational History  . Occupation: physician    Employer: [employer] at [employer] Use  . Smoking status: Never Smoker  .  Smokeless tobacco: Never Used  Vaping Use  . Vaping Use: Never used  Substance and Sexual Activity  . Alcohol use: Yes  . Drug use: No  . Sexual activity: Yes    Partners: Female  Other Topics Concern  . Not on file  Social History Narrative   Physician.   Married to [place], [place]   1 living child.     2 children initially.     Daughter: Vladimir Creeks   1 deceased son, Shanon Brow   3 grandkids.     Social Determinants of Health   Financial Resource Strain: Not on file  Food Insecurity: Not on file  Transportation Needs: Not on file  Physical Activity: Not on file  Stress: Not on file  Social Connections: Not on file     Family History: The patient's family history includes Anxiety disorder in  his mother; Breast cancer in his mother; Breast cancer (age of onset: 59) in his sister; Congestive Heart Failure in his father; Diabetes in his father; Hyperparathyroidism in his sister; Hypertension in his mother; Ovarian cancer in his sister; Peripheral Artery Disease (age of onset: 55) in his father; Prostate cancer in his brother. There is no history of Colon cancer, Colon polyps, Esophageal cancer, Stomach cancer, or Rectal cancer.  ROS:   Please see the history of present illness.    All other systems reviewed and are negative.  EKGs/Labs/Other Studies Reviewed:    The following studies were reviewed today: 2D echocardiogram 07/29/2019: IMPRESSIONS    1. Left ventricular ejection fraction, by estimation, is 60 to 65%. The  left ventricle has normal function. The left ventricle has no regional  wall motion abnormalities. Left ventricular diastolic function could not  be evaluated.  2. Right ventricular systolic function is normal. The right ventricular  size is normal. There is normal pulmonary artery systolic pressure. The  estimated right ventricular systolic pressure is 79.0 mmHg.  3. Left atrial size was mildly dilated.  4. Suture plication of P1 and P2 segments of posterior  leaflet. Sorin  Theatre manager (size 2mm). Clipping of left atrial appendage.  The mitral valve has been repaired/replaced. Mild mitral valve  regurgitation. The mean mitral valve gradient  is 6.0 mmHg with average heart rate of 79 bpm. There is a 38 prosthetic  annuloplasty ring present in the mitral position. Procedure Date:  05/21/2019.  5. The aortic valve is tricuspid. Aortic valve regurgitation is mild. No  aortic stenosis is present.  6. The inferior vena cava is normal in size with greater than 50%  respiratory variability, suggesting right atrial pressure of 3 mmHg.   Comparison(s): Changes from prior study are noted. MV has been repaired.   EKG:  EKG is  ordered today.  The ekg ordered today demonstrates normal sinus rhythm 96 bpm, first-degree AV block, otherwise within normal limits.  Recent Labs: No results found for requested labs within last 8760 hours.  Recent Lipid Panel    Component Value Date/Time   CHOL 230 (H) 12/24/2018 1241   TRIG 133 12/24/2018 1241   HDL 75 12/24/2018 1241   CHOLHDL 3.1 12/24/2018 1241   VLDL 22.6 04/10/2012 1250   LDLCALC 130 (H) 12/24/2018 1241   LDLDIRECT 130.1 04/10/2012 1250     Risk Assessment/Calculations:       Physical Exam:    VS:  BP 134/80   Pulse 96   Ht 6\' 2"  (1.88 m)   Wt 206 lb 3.2 oz (93.5 kg)   SpO2 97%   BMI 26.47 kg/m     Wt Readings from Last 3 Encounters:  05/25/20 206 lb 3.2 oz (93.5 kg)  08/03/19 198 lb (89.8 kg)  07/16/19 202 lb (91.6 kg)     GEN:  Well nourished, well developed in no acute distress HEENT: Normal NECK: No JVD; No carotid bruits LYMPHATICS: No lymphadenopathy CARDIAC: RRR, 1/6 systolic murmur at the apex RESPIRATORY:  Clear to auscultation without rales, wheezing or rhonchi  ABDOMEN: Soft, non-tender, non-distended MUSCULOSKELETAL:  No edema; No deformity  SKIN: Warm and dry NEUROLOGIC:  Alert and oriented x 3 PSYCHIATRIC:  Normal affect   ASSESSMENT:    1.  S/P MVR (mitral valve repair)    PLAN:    In order of problems listed above:  1. The patient is clinically stable with New York Heart Association functional class I limitation,  able to perform high-level physical activity with minimal symptoms.  His exam suggests normal function of his mitral valve repair site.  We discussed the ongoing lifelong indication for SBE prophylaxis and he will follow this.  I would like to see him back in 1 year with an echocardiogram for his follow-up.  He will call if any change in symptoms.  We discussed consideration of adding back a beta-blocker to slow heart rate and increased filling time in the setting of mild functional mitral stenosis.  He is not inclined to do this as he does not have any current functional limitation.  We will continue current medications without change.  Medication Adjustments/Labs and Tests Ordered: Current medicines are reviewed at length with the patient today.  Concerns regarding medicines are outlined above.  Orders Placed This Encounter  Procedures  . EKG 12-Lead   No orders of the defined types were placed in this encounter.   Patient Instructions  Medication Instructions:  Your provider recommends that you continue on your current medications as directed. Please refer to the Current Medication list given to you today.   *If you need a refill on your cardiac medications before your next appointment, please call your pharmacy*  Follow-Up: You will be contacted to schedule your 1 year echo and office visit.    Signed, Sherren Mocha, MD  05/25/2020 3:12 PM    [place]

## 2020-06-28 LAB — TSH: TSH: 1.93 (ref <=5.90)

## 2020-06-28 LAB — BASIC METABOLIC PANEL
Creatinine: 0.8 (ref <=1.3)
Glucose: 90
Potassium: 4.4 (ref 3.4–5.3)

## 2020-06-28 LAB — PSA: PSA: 1

## 2020-06-28 LAB — CBC AND DIFFERENTIAL
Hemoglobin: 16.3 (ref 13.5–17.5)
Platelets: 217 (ref 150–399)

## 2020-06-28 LAB — HEPATIC FUNCTION PANEL
ALT: 31 (ref 10–40)
AST: 33 (ref 14–40)

## 2020-06-28 LAB — LIPID PANEL
Cholesterol: 243 — AB (ref 0–200)
HDL: 69 (ref 35–70)
LDL Cholesterol: 148
Triglycerides: 146 (ref 40–160)

## 2020-06-28 LAB — HEMOGLOBIN A1C: Hemoglobin A1C: 5.6

## 2020-08-04 ENCOUNTER — Other Ambulatory Visit: Payer: Self-pay

## 2020-08-04 ENCOUNTER — Encounter: Payer: Self-pay | Admitting: Family Medicine

## 2020-08-04 ENCOUNTER — Ambulatory Visit (INDEPENDENT_AMBULATORY_CARE_PROVIDER_SITE_OTHER): Payer: No Typology Code available for payment source | Admitting: Family Medicine

## 2020-08-04 VITALS — BP 130/86 | HR 93 | Temp 97.5°F | Ht 74.0 in | Wt 202.0 lb

## 2020-08-04 DIAGNOSIS — Z0000 Encounter for general adult medical examination without abnormal findings: Secondary | ICD-10-CM

## 2020-08-04 DIAGNOSIS — Z7189 Other specified counseling: Secondary | ICD-10-CM

## 2020-08-04 DIAGNOSIS — J309 Allergic rhinitis, unspecified: Secondary | ICD-10-CM

## 2020-08-04 MED ORDER — MONTELUKAST SODIUM 10 MG PO TABS
ORAL_TABLET | Freq: Every day | ORAL | 3 refills | Status: DC
  Filled 2020-08-04 – 2020-09-09 (×2): qty 90, 90d supply, fill #0
  Filled 2020-12-07: qty 90, 90d supply, fill #1
  Filled 2021-03-07: qty 90, 90d supply, fill #2
  Filled 2021-05-30: qty 90, 90d supply, fill #3

## 2020-08-04 NOTE — Patient Instructions (Signed)
Take care.  Glad to see you. Update me as needed.  

## 2020-08-04 NOTE — Progress Notes (Signed)
This visit occurred during the SARS-CoV-2 public health emergency.  Safety protocols were in place, including screening questions prior to the visit, additional usage of staff PPE, and extensive cleaning of exam room while observing appropriate contact time as indicated for disinfecting solutions.  CPE- See plan.  Routine anticipatory guidance given to patient.  See health maintenance.  The possibility exists that previously documented standard health maintenance information may have been brought forward from a previous encounter into this note.  If needed, that same information has been updated to reflect the current situation based on today's encounter.    Tetanus 2019 Flu yearly PNA due at 21.   Shingrix 2020 covid vaccine 2021 PSA screening d/w pt.  PSA wnl 2022 Colonoscopy 2019.  Living will d/w pt.  Wife designated if patient were incapacitated.  Then daughter designated if needed. Diet and exercise d/w pt.    BP usually 130/70s at home.  Pulse had been down to 70s occ.  He isn't biking.  He is looking after his grandkids and working and that limits time for exercise.  He off beta blocker.  He walked 3.5 miles yesterday.  Can walk up to 5-6 miles at a time.    Rare use of PPI.  GERD usually controlled.    Seasonal allergies, s/p allergy shots, on antihistamines at baseline.  Current meds control sx.    Prev L knee pain is 90% better with diclofenac topical.    PMH and SH reviewed  Meds, vitals, and allergies reviewed.   ROS: Per HPI.  Unless specifically indicated otherwise in HPI, the patient denies:  General: fever. Eyes: acute vision changes ENT: sore throat Cardiovascular: chest pain Respiratory: SOB GI: vomiting GU: dysuria Musculoskeletal: acute back pain Derm: acute rash Neuro: acute motor dysfunction Psych: worsening mood Endocrine: polydipsia Heme: bleeding Allergy: hayfever  GEN: nad, alert and oriented HEENT: ncat NECK: supple w/o LA CV: rrr. PULM:  ctab, no inc wob ABD: soft, +bs EXT: no edema SKIN: no acute rash

## 2020-08-07 NOTE — Assessment & Plan Note (Addendum)
Living will d/w pt.  Wife designated if patient were incapacitated.  Then daughter designated if needed.

## 2020-08-07 NOTE — Assessment & Plan Note (Signed)
Tetanus 2019 Flu yearly PNA due at 26.   Shingrix 2020 covid vaccine 2021 PSA screening d/w pt.  PSA wnl 2022 Colonoscopy 2019.  Living will d/w pt.  Wife designated if patient were incapacitated.  Then daughter designated if patient were incapacitated.   Diet and exercise d/w pt.

## 2020-08-07 NOTE — Assessment & Plan Note (Signed)
Continue antihistamines.  Update me as needed.  He agrees.

## 2020-09-09 ENCOUNTER — Other Ambulatory Visit: Payer: Self-pay

## 2020-10-18 ENCOUNTER — Telehealth: Payer: Self-pay

## 2020-10-18 ENCOUNTER — Other Ambulatory Visit: Payer: Self-pay

## 2020-10-18 MED ORDER — NIRMATRELVIR/RITONAVIR (PAXLOVID)TABLET
3.0000 | ORAL_TABLET | Freq: Two times a day (BID) | ORAL | 0 refills | Status: AC
Start: 2020-10-18 — End: 2020-10-23
  Filled 2020-10-18: qty 30, 5d supply, fill #0

## 2020-10-18 MED ORDER — BENZONATATE 200 MG PO CAPS
200.0000 mg | ORAL_CAPSULE | Freq: Three times a day (TID) | ORAL | 1 refills | Status: DC | PRN
Start: 2020-10-18 — End: 2021-08-14
  Filled 2020-10-18: qty 90, 30d supply, fill #0

## 2020-10-18 NOTE — Telephone Encounter (Signed)
Spoke to pt.Symptoms started yesterday with slightly scratchy throat and occasional cough. Woke up this morning with severe fatigue. No fever or shortness of breath. Cough is rare. Eating okay. Energy level is better after taking Aleve. He did go ahead and start the Paxlovid.

## 2020-10-18 NOTE — Telephone Encounter (Signed)
Pt has tested positive for Covid on home test. Has fatigue and a cough. Would like something for cough. Preferably Tessalon Perles 200 mg 60 or 90 caps sent to [facility].

## 2020-10-18 NOTE — Telephone Encounter (Signed)
Sent both, please check with patient later today about his symptoms and let me know how he is feeling. He knows to isolate.  Let me know if he is worsening or if he needs a note for work.  Thanks.

## 2020-10-18 NOTE — Telephone Encounter (Signed)
Noted. Thanks.  Please update me as needed.

## 2020-10-18 NOTE — Telephone Encounter (Signed)
He would also like a rx for Paxlovid sent in.

## 2020-10-24 DIAGNOSIS — I340 Nonrheumatic mitral (valve) insufficiency: Secondary | ICD-10-CM

## 2020-10-24 DIAGNOSIS — Z9889 Other specified postprocedural states: Secondary | ICD-10-CM

## 2020-10-24 DIAGNOSIS — I341 Nonrheumatic mitral (valve) prolapse: Secondary | ICD-10-CM

## 2020-12-07 ENCOUNTER — Other Ambulatory Visit: Payer: Self-pay

## 2020-12-07 ENCOUNTER — Other Ambulatory Visit: Payer: Self-pay | Admitting: Family Medicine

## 2020-12-07 IMAGING — CT CT CTA ABD/PEL W/CM AND/OR W/O CM
2 of 7 series · 15 of 36 positions shown · IV contrast (iopamidol)
Comparison: None.

CLINICAL DATA: 63-year-old male with a history of mitral
regurgitation. Preoperative evaluation prior to mitral valve repair.

EXAM:
CT ANGIOGRAPHY CHEST, ABDOMEN AND PELVIS
TECHNIQUE: Multidetector CT imaging through the chest, abdomen and pelvis was
performed using the standard protocol during bolus administration of
intravenous contrast. Multiplanar reconstructed images and MIPs were
obtained and reviewed to evaluate the vascular anatomy.
CONTRAST:  75mL S3PWBQ-SZP IOPAMIDOL (S3PWBQ-SZP) INJECTION 76%

[Series 6: cta cap aneurysm 2.00 bv36 s3 axial arterial · axial · arterial · 0.82mm/px · z∈[+1248,+1908]mm · 14 of 374 slices shown]
[im 22/374  lung]
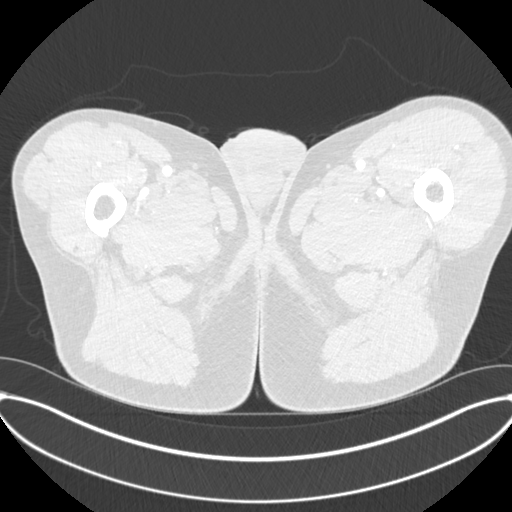
[im 44/374  mediastinal]
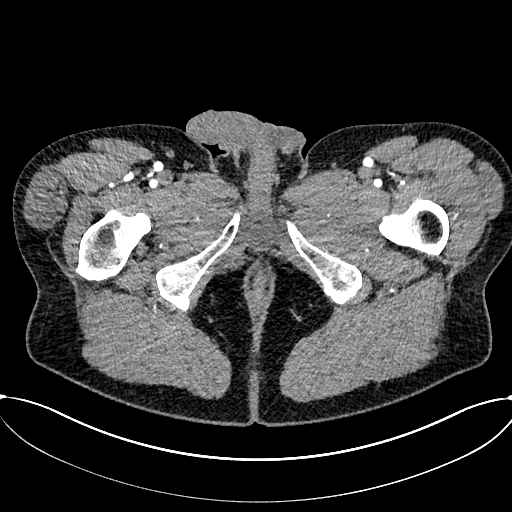
[im 66/374  lung]
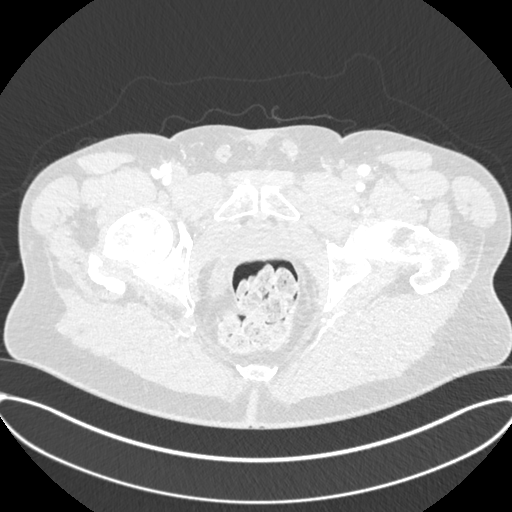
[im 110/374  mediastinal]
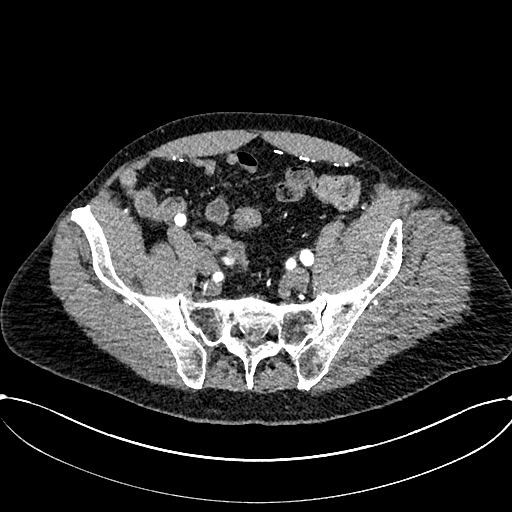
[im 132/374  lung]
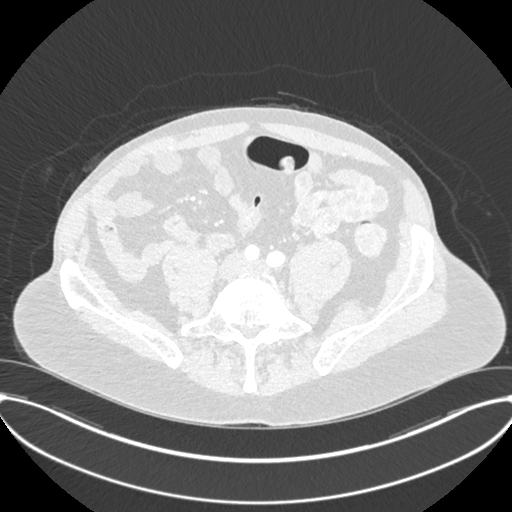
[im 154/374  mediastinal]
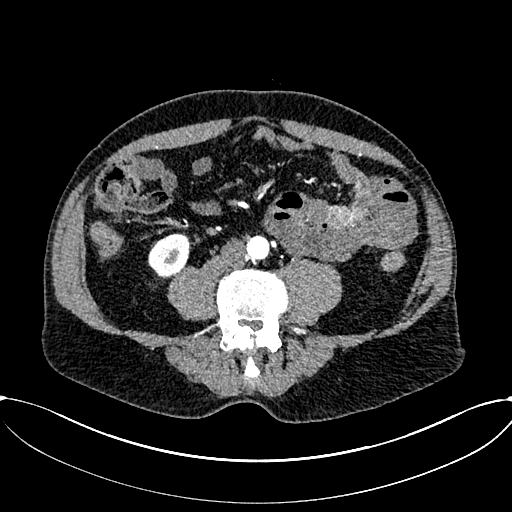
[im 176/374  lung]
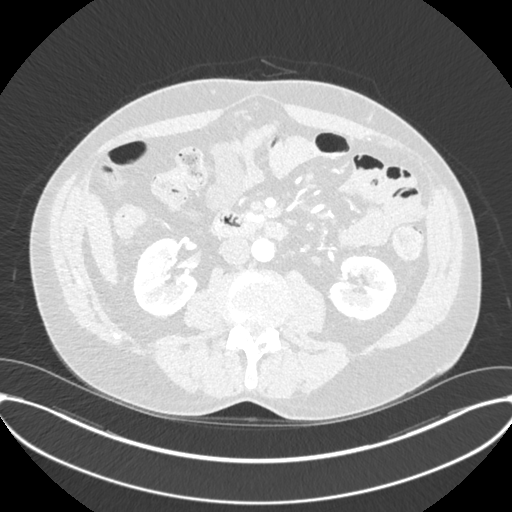
[im 198/374  mediastinal]
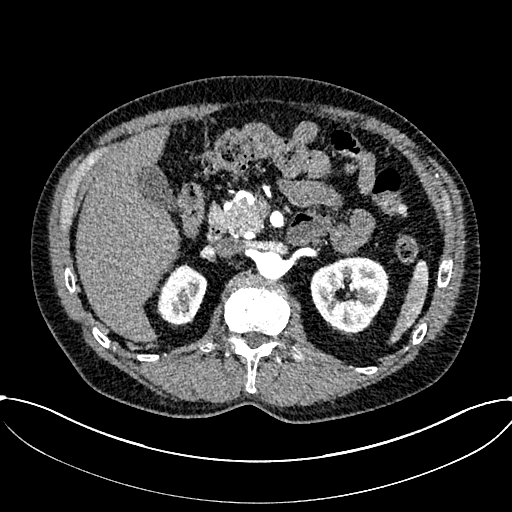
[im 220/374  lung]
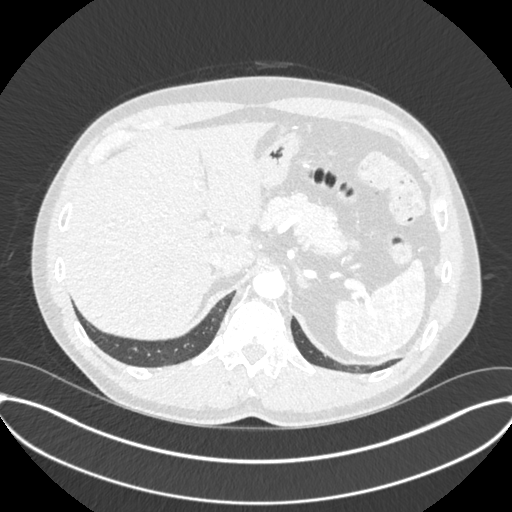
[im 242/374  mediastinal]
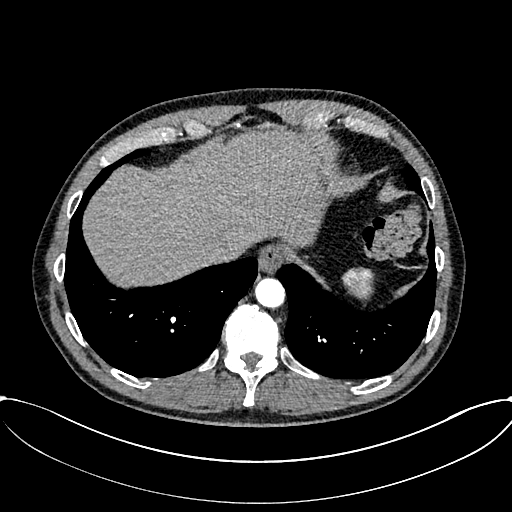
[im 286/374  lung]
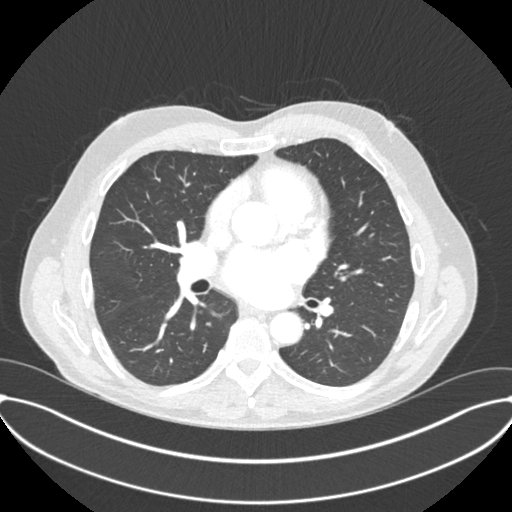
[im 308/374  mediastinal]
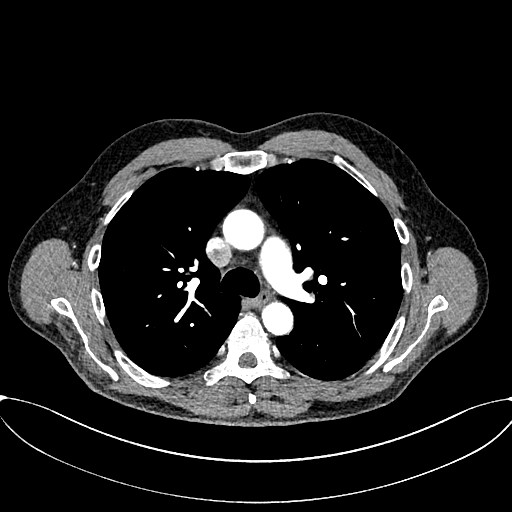
[im 330/374  lung]
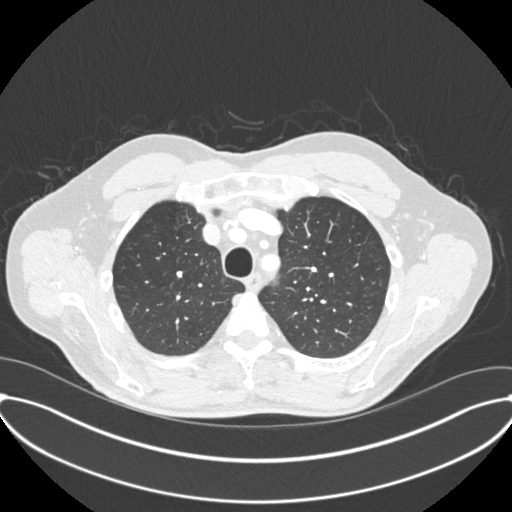
[im 352/374  mediastinal]
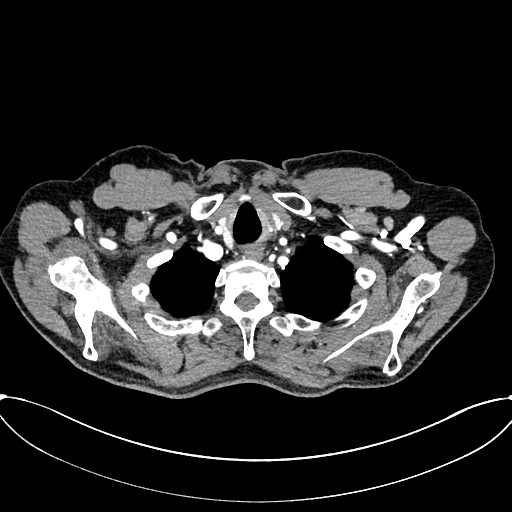

[Series 8: cta cap aneurysm 2.00 bv36 s3 cor st · coronal · 0.82mm/px · 1 of 171 slices shown]
[im 86/171  mediastinal]
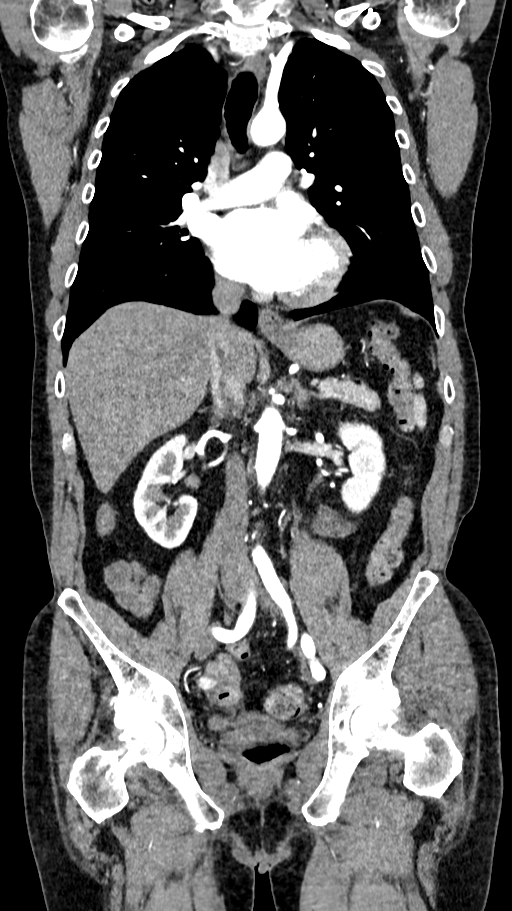

[15 of 36 positions shown; findings below may reference images not displayed]

FINDINGS: CTA CHEST FINDINGS

Cardiovascular: Conventional 3 vessel aortic arch anatomy. No
evidence of aneurysm. The heart is at the upper limits of normal for
size. No pericardial effusion. No significant atherosclerotic plaque
along the thoracic aorta. Evaluation of the coronary arteries is
limited by non cardiac gated technique. However, there appear to be
a few specks of calcified plaque along the courses of the coronary
arteries. No evidence of thrombus within the left atrial appendage.
The main pulmonary artery is normal in size.

Mediastinum/Nodes: Incidental note is made of a 1.6 cm
low-attenuation lesion in the left thyroid gland consistent with a
thyroid cyst or nodule. Given size greater than 1.5 cm, further
evaluation will be recommended. No suspicious mediastinal or hilar
adenopathy. Unremarkable thoracic esophagus.

Lungs/Pleura: Minimal blebs present at the left greater than right
lung apex. The lungs are otherwise clear. No significant
emphysematous or bronchitic changes. No suspicious mass or nodule.

Musculoskeletal: No acute fracture or aggressive appearing lytic or
blastic osseous lesion.

Review of the MIP images confirms the above findings.

CTA ABDOMEN AND PELVIS FINDINGS

VASCULAR

Aorta: Normal caliber aorta without aneurysm, dissection, vasculitis
or significant stenosis.

Celiac: Downward sloping celiac artery with significant narrowing at
the origin and 1 slice where no definite contrast material can be
identified consistent with either a critical stenosis, or focal
occlusion. The configuration is suggestive of advanced median
arcuate ligament compression rather than atherosclerotic vascular
calcifications. Marked hypertrophy of the pancreaticoduodenal
arcade. The celiac artery likely is supplied retrograde via the SMA.

SMA: Patent without evidence of aneurysm, dissection, vasculitis or
significant stenosis.

Renals: Both renal arteries are patent without evidence of aneurysm,
dissection, vasculitis, fibromuscular dysplasia or significant
stenosis.

IMA: Patent without evidence of aneurysm, dissection, vasculitis or
significant stenosis.

Inflow: Patent without evidence of aneurysm, dissection, vasculitis
or significant stenosis.

Veins: No focal venous abnormality.

Review of the MIP images confirms the above findings.

NON-VASCULAR

Hepatobiliary: No focal liver abnormality is seen. No gallstones,
gallbladder wall thickening, or biliary dilatation.

Pancreas: Unremarkable. No pancreatic ductal dilatation or
surrounding inflammatory changes.

Spleen: Normal in size without focal abnormality.

Adrenals/Urinary Tract: Adrenal glands are unremarkable. Kidneys are
normal, without renal calculi, focal lesion, or hydronephrosis.
Bladder is unremarkable.

Stomach/Bowel: Stomach is within normal limits. Appendix appears
normal. No evidence of bowel wall thickening, distention, or
inflammatory changes. Colonic diverticular disease without CT
evidence of active inflammation.

Lymphatic: No suspicious lymphadenopathy.

Reproductive: Prostate is unremarkable.

Other: No abdominal wall hernia or abnormality. No abdominopelvic
ascites.

Musculoskeletal: No acute fracture or aggressive appearing lytic or
blastic osseous lesion. Multilevel degenerative disc disease
visualized most significant at L3-L4, L4-L5 and L5-S1. Additionally,
there appear to be congenitally short pedicles which can contribute
to central stenosis.

Review of the MIP images confirms the above findings.
IMPRESSION: VASCULAR

1. No evidence of aortic aneurysm, dissection or other acute
arterial abnormality.
2. Critical stenosis versus short segment occlusion of the celiac
artery secondary to advanced median arcuate ligament compression.
Celiac artery opacifies retrograde through a hypertrophic
pancreaticoduodenal arcade.
3. Minimal aortic atherosclerotic plaque. Aortic Atherosclerosis
([id number]-170.0).
4. Coronary artery calcifications.
5. Heart is at the upper limits of normal for size.

NON VASCULAR

1. Approximately 1.6 cm low-attenuation thyroid nodule or cyst in
the left gland. Recommend dedicated thyroid ultrasound for further
characterization.
2. Colonic diverticular disease without CT evidence of active
inflammation.
3. Lower lumbar degenerative disc disease with possible central
stenosis due in part to congenitally short pedicles.

## 2020-12-07 MED ORDER — AMOXICILLIN 500 MG PO CAPS
2000.0000 mg | ORAL_CAPSULE | Freq: Every day | ORAL | 0 refills | Status: DC | PRN
  Filled 2020-12-07: qty 20, 5d supply, fill #0

## 2021-03-07 ENCOUNTER — Other Ambulatory Visit: Payer: Self-pay

## 2021-04-10 ENCOUNTER — Encounter: Payer: Self-pay | Admitting: Family Medicine

## 2021-04-10 ENCOUNTER — Telehealth: Payer: Self-pay | Admitting: Family Medicine

## 2021-04-10 ENCOUNTER — Other Ambulatory Visit: Payer: Self-pay

## 2021-04-10 ENCOUNTER — Other Ambulatory Visit: Payer: Self-pay | Admitting: Family Medicine

## 2021-04-10 MED ORDER — AMOXICILLIN-POT CLAVULANATE 875-125 MG PO TABS
1.0000 | ORAL_TABLET | Freq: Two times a day (BID) | ORAL | 0 refills | Status: DC
  Filled 2021-04-10: qty 20, 10d supply, fill #0

## 2021-04-10 MED ORDER — AMOXICILLIN 875 MG PO TABS
875.0000 mg | ORAL_TABLET | Freq: Two times a day (BID) | ORAL | 0 refills | Status: AC
  Filled 2021-04-10: qty 20, 10d supply, fill #0

## 2021-04-10 NOTE — Telephone Encounter (Signed)
Spoke with pharmacy and Dr. Silvio Pate had requested amoxicillin 875 mg which they do have in stock. Pharmacist stated they pretty much have all abx except the augmentin 875-125mg  just for reference.

## 2021-04-10 NOTE — Telephone Encounter (Signed)
Please check with pharmacy and see what is available and let me know.  Thanks.

## 2021-04-10 NOTE — Addendum Note (Signed)
Addended by: Tonia Ghent on: 04/10/2021 04:42 PM   Modules accepted: Orders

## 2021-04-10 NOTE — Telephone Encounter (Signed)
Patient aware rx was sent.

## 2021-04-10 NOTE — Telephone Encounter (Signed)
[facility] pharmacy and stated that the medication amoxicillin-clavulanate (AUGMENTIN) 875-125 MG tablet , is on Backorder and Dr. Silvio Pate stated that he thinks he would do better on the amoxicillin 875- 125mg 

## 2021-04-10 NOTE — Telephone Encounter (Signed)
Sent. Thanks.   

## 2021-04-14 ENCOUNTER — Other Ambulatory Visit: Payer: Self-pay

## 2021-04-14 MED ORDER — AMOXICILLIN-POT CLAVULANATE 875-125 MG PO TABS
1.0000 | ORAL_TABLET | Freq: Two times a day (BID) | ORAL | 1 refills | Status: DC
  Filled 2021-04-14: qty 20, 10d supply, fill #0
  Filled 2021-05-04: qty 20, 10d supply, fill #1

## 2021-05-04 ENCOUNTER — Other Ambulatory Visit: Payer: Self-pay

## 2021-05-10 ENCOUNTER — Ambulatory Visit (HOSPITAL_COMMUNITY): Payer: No Typology Code available for payment source | Attending: Cardiology

## 2021-05-10 ENCOUNTER — Encounter: Payer: Self-pay | Admitting: Cardiovascular Disease

## 2021-05-10 ENCOUNTER — Other Ambulatory Visit: Payer: Self-pay

## 2021-05-10 ENCOUNTER — Ambulatory Visit: Payer: No Typology Code available for payment source | Admitting: Cardiovascular Disease

## 2021-05-10 VITALS — BP 130/70 | HR 89 | Ht 74.0 in | Wt 202.4 lb

## 2021-05-10 DIAGNOSIS — I341 Nonrheumatic mitral (valve) prolapse: Secondary | ICD-10-CM | POA: Insufficient documentation

## 2021-05-10 DIAGNOSIS — I340 Nonrheumatic mitral (valve) insufficiency: Secondary | ICD-10-CM | POA: Diagnosis not present

## 2021-05-10 DIAGNOSIS — Z9889 Other specified postprocedural states: Secondary | ICD-10-CM

## 2021-05-10 LAB — ECHOCARDIOGRAM COMPLETE
Area-P 1/2: 2.42 cm2
MV M vel: 6.06 m/s
MV Peak grad: 146.7 mmHg
MV VTI: 3.01 cm2
P 1/2 time: 384 msec
S' Lateral: 1.75 cm

## 2021-05-10 NOTE — Patient Instructions (Signed)
Medication Instructions:  Your physician recommends that you continue on your current medications as directed. Please refer to the Current Medication list given to you today.  *If you need a refill on your cardiac medications before your next appointment, please call your pharmacy*   Lab Work: NONE If you have labs (blood work) drawn today and your tests are completely normal, you will receive your results only by: [place] (if you have MyChart) OR A paper copy in the mail If you have any lab test that is abnormal or we need to change your treatment, we will call you to review the results.   Testing/Procedures: NONE   Follow-Up: At [facility], you and your health needs are our priority.  As part of our continuing mission to provide you with exceptional heart care, we have created designated Provider Care Teams.  These Care Teams include your primary Cardiologist (physician) and Advanced Practice Providers (APPs -  Physician Assistants and Nurse Practitioners) who all work together to provide you with the care you need, when you need it.  Your next appointment:   1 year(s)  The format for your next appointment:   In Person  Provider:   Sherren Mocha  Thank you for allowing [place] at [facility] to serve you, God Bless!!

## 2021-05-10 NOTE — Progress Notes (Signed)
Cardiology Office Note:    Date:  05/10/2021   ID:  Ryan Carbon, MD, DOB 08-Jan-1956, MRN [id number]  PCP:  Tonia Ghent, MD   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  The patient was treated with a complex repair including placement of a 38 mm Sorin annuloplasty ring via a right minithoracotomy approach.  His postoperative echo showed no residual mitral regurgitation and a transmitral mean gradient of 6 mmHg.  The patient is here alone today.  He just had an echo done prior to this.  The formal report is pending, but I have reviewed the images.  The patient continues to have mild exercise intolerance only with high-level exertion.  States that this is a little better when compared to last year.  He uses a rowing machine and when his heart rate gets to about 150 bpm he does not feel well.  With moderate level activity such as walking, he feels fine.  No chest pain or pressure.  No edema, orthopnea, or PND.   Past Medical History:  Diagnosis Date   ALLERGIC RHINITIS 11/25/2008   Allergy    Asthma    exercised induced and cold induced   Atypical nevus of scapular region 01/12/2017   GERD (gastroesophageal reflux disease)    occasional   Hearing loss, right 04/11/2012   Multiple surgeries in the past, partial hearing loss   Hematospermia 02/19/2018   History of brain concussion 01/18/2018   Hyperlipidemia LDL goal <007 62/26/3335   Lichen planus 4/56/2563   Qualifier: Diagnosis of  By: Council Mechanic MD, Hilaria Ota    Mitral regurgitation, myxomatous 04/11/2012   Mitral valve prolapse 04/11/2012   Severe, annual echos   S/P minimally-invasive mitral  valve repair 05/21/2019   Complex valvuloplasty including triangular resection of flail P1 segment, artificial Gore-tex neochord placement x6, suture plication of P1 and P2 segments of posterior leaflet and 38 mm Sorin Memo 4D ring annuloplasty via right mini thoracotomy approach    Past Surgical History:  Procedure Laterality Date    tendon repair quadracep Left 2013   APPENDECTOMY     Bilateral laparoscopic hernia repairs  1992   CLIPPING OF ATRIAL APPENDAGE N/A 05/21/2019   Procedure: CLIPPING OF ATRIAL APPENDAGE USING ATRICURE CLIP SIZE 45MM;  Surgeon: Rexene Alberts, MD;  Location: [place];  Service: Open Heart Surgery;  Laterality: N/A;   COLONOSCOPY  11/17/07   Dr. Henrene Pastor   Echo (other)     positive for MVP, markedly myxomatous, marked prolapse TVP with Regurg (03/1990)   Echo (other)  06/21/2000   mitral valve thickened, myxomatous vs degenerative, moderate MVP, moderate MR   Echo (other)  07/26/2006   LVF normal, EF 55-65%, Triv AR mod MR, marked posterior leaflet MVP   Echo (other)  06/15/2009   no change (EF 55-60% Mild Myxomatous Mitral Valve Mod MVP mod MR Mild LAE)   MITRAL VALVE REPAIR Right 05/21/2019   Procedure: MINIMALLY INVASIVE MITRAL VALVE REPAIR (MVR) USING MEMO 4D MITRAL RING SIZE 38MM;  Surgeon: Rexene Alberts, MD;  Location: [place];  Service: Open Heart Surgery;  Laterality: Right;  Nasal Septoplasty (other)  2008   Dr. Kem Kays RECONSTRUCTION     right ear   RIGHT/LEFT HEART CATH AND CORONARY ANGIOGRAPHY N/A 05/06/2019   Procedure: RIGHT/LEFT HEART CATH AND CORONARY ANGIOGRAPHY;  Surgeon: Sherren Mocha, MD;  Location: [place] [facility];  Service: Cardiovascular;  Laterality: N/A;   STAPEDECTOMY     TEE WITHOUT CARDIOVERSION N/A 05/06/2019   Procedure: TRANSESOPHAGEAL ECHOCARDIOGRAM (TEE);  Surgeon: Donato Heinz, MD;  Location[facility];  Service: Endoscopy;  Laterality: N/A;   TEE WITHOUT CARDIOVERSION N/A 05/21/2019   Procedure:  TRANSESOPHAGEAL ECHOCARDIOGRAM (TEE);  Surgeon: Rexene Alberts, MD;  Location: [place];  Service: Open Heart Surgery;  Laterality: N/A;   TONSILLECTOMY     age 25    Current Medications: Current Meds  Medication Sig   acetaminophen (TYLENOL) 500 MG tablet Take 500-1,000 mg by mouth every 6 (six) hours as needed (for pain.).   albuterol (PROAIR HFA) 108 (90 Base) MCG/ACT inhaler Inhale 1-2 puffs into the lungs every 6 (six) hours as needed for wheezing or shortness of breath.   amoxicillin (AMOXIL) 500 MG capsule Take 4 capsules (2,000 mg total) by mouth daily as needed (for dental work).   aspirin EC 81 MG tablet Take 81 mg by mouth every other day. Swallow whole.   cetirizine (ZYRTEC) 10 MG tablet Take 10 mg by mouth every evening.   esomeprazole (NEXIUM) 20 MG capsule Take 20 mg by mouth as needed.   fexofenadine (ALLEGRA) 180 MG tablet Take 180 mg by mouth daily as needed (allergies.).   fluticasone (FLONASE) 50 MCG/ACT nasal spray Place 2 sprays into both nostrils daily.   loratadine (CLARITIN) 10 MG tablet Take 10 mg by mouth every evening.   montelukast (SINGULAIR) 10 MG tablet TAKE 1 TABLET BY MOUTH ONCE DAILY   triamcinolone cream (KENALOG) 0.1 % Apply 1 application topically 2 (two) times daily as needed.     Allergies:   Patient has no known allergies.   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   Married to [place], [place]   1 living  child.     2 children initially.     Daughter: Vladimir Creeks   1 deceased son, Shanon Brow   3 grandkids   Social Determinants of Radio broadcast assistant Strain: Not on [employer] Insecurity: Not on file  Transportation Needs: Not on file  Physical Activity: Not on file  Stress: Not on file  Social Connections: Not on file     Family History: The patient's family history includes Anxiety disorder in his mother; Breast cancer in his mother; Breast cancer (age of onset: 28) in his sister; Congestive Heart Failure in his father; Diabetes in his father; Hyperparathyroidism in his sister; Hypertension in his mother; Ovarian cancer in his sister; Peripheral Artery  Disease (age of onset: 61) in his father; Prostate cancer in his brother. There is no history of Colon cancer, Colon polyps, Esophageal cancer, Stomach cancer, or Rectal cancer.  ROS:   Please see the history of present illness.    All other systems reviewed and are negative.  EKGs/Labs/Other Studies Reviewed:    The following studies were reviewed today: 2D echocardiogram is personally reviewed.  LV function is vigorous with LVEF greater than 65%.  There is trivial mitral regurgitation present.  The transmitral mean gradient is 4 mmHg.  There is trivial aortic insufficiency.  EKG:  EKG is ordered today.  The ekg ordered today demonstrates normal sinus rhythm 89 bpm, within normal limits.  Recent Labs: 06/28/2020: ALT 31; Creatinine 0.8; Hemoglobin 16.3; Platelets 217; Potassium 4.4; TSH 1.93  Recent Lipid Panel    Component Value Date/Time   CHOL 243 (A) 06/28/2020 0000   TRIG 146 06/28/2020 0000   HDL 69 06/28/2020 0000   CHOLHDL 3.1 12/24/2018 1241   VLDL 22.6 04/10/2012 1250   LDLCALC 148 06/28/2020 0000   LDLCALC 130 (H) 12/24/2018 1241   LDLDIRECT 130.1 04/10/2012 1250     Risk Assessment/Calculations:           Physical Exam:    VS:  BP 130/70    Pulse 89    Ht 6\' 2"  (1.88 m)    Wt 202 lb 6.4 oz (91.8 kg)    SpO2  97%    BMI 25.99 kg/m     Wt Readings from Last 3 Encounters:  05/10/21 202 lb 6.4 oz (91.8 kg)  08/04/20 202 lb (91.6 kg)  05/25/20 206 lb 3.2 oz (93.5 kg)     GEN:  Well nourished, well developed in no acute distress HEENT: Normal NECK: No JVD; No carotid bruits LYMPHATICS: No lymphadenopathy CARDIAC: RRR, soft 1/6 early systolic murmur best heard at the apex RESPIRATORY:  Clear to auscultation without rales, wheezing or rhonchi  ABDOMEN: Soft, non-tender, non-distended MUSCULOSKELETAL:  No edema; No deformity  SKIN: Warm and dry NEUROLOGIC:  Alert and oriented x 3 PSYCHIATRIC:  Normal affect   ASSESSMENT:    1. 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  His echocardiogram is personally reviewed with him today, demonstrating a mean transmitral gradient of 4 mmHg with trivial mitral insufficiency.  He will continue with annual clinical follow-up.  I will repeat an echocardiogram in 2 to 3 years.  He follows SBE prophylaxis as indicated.      Medication Adjustments/Labs and Tests Ordered: Current medicines are reviewed at length with the patient today.  Concerns regarding medicines are outlined above.  Orders Placed This Encounter  Procedures   EKG 12-Lead   No orders of the defined types were placed in this encounter.   Patient Instructions  Medication Instructions:  Your physician recommends that you continue on your current medications as directed. Please refer to the Current Medication list given to you today.  *If you need a refill on your cardiac medications before your next appointment, please call your pharmacy*   Lab Work: NONE If you have labs (blood work) drawn today and your tests are completely normal, you will receive your results only by: [place] (if you have MyChart) OR A paper copy in the mail If you have any  lab test that is abnormal  or we need to change your treatment, we will call you to review the results.   Testing/Procedures: NONE   Follow-Up: At [facility], you and your health needs are our priority.  As part of our continuing mission to provide you with exceptional heart care, we have created designated Provider Care Teams.  These Care Teams include your primary Cardiologist (physician) and Advanced Practice Providers (APPs -  Physician Assistants and Nurse Practitioners) who all work together to provide you with the care you need, when you need it.  Your next appointment:   1 year(s)  The format for your next appointment:   In Person  Provider:   Sherren Mocha  Thank you for allowing [place] at [facility] to serve you, God Bless!!    Signed, Sherren Mocha, MD  05/10/2021 3:36 PM    [place]

## 2021-05-30 ENCOUNTER — Other Ambulatory Visit: Payer: Self-pay

## 2021-05-30 ENCOUNTER — Telehealth: Payer: Self-pay

## 2021-05-30 MED ORDER — AMOXICILLIN-POT CLAVULANATE 875-125 MG PO TABS
1.0000 | ORAL_TABLET | Freq: Two times a day (BID) | ORAL | 0 refills | Status: DC
  Filled 2021-05-30: qty 20, 10d supply, fill #0

## 2021-05-30 NOTE — Telephone Encounter (Signed)
Pt asking for a refill of Augmentin to take with him on vacation next week. He is having recurring ear infections and does not want to be without something if it happens while he is away. Send to [place].

## 2021-05-30 NOTE — Telephone Encounter (Signed)
Sent. Thanks.   

## 2021-05-30 NOTE — Telephone Encounter (Signed)
Spoke to pt

## 2021-08-14 ENCOUNTER — Ambulatory Visit (INDEPENDENT_AMBULATORY_CARE_PROVIDER_SITE_OTHER): Payer: No Typology Code available for payment source | Admitting: Family Medicine

## 2021-08-14 ENCOUNTER — Other Ambulatory Visit: Payer: Self-pay

## 2021-08-14 ENCOUNTER — Encounter: Payer: Self-pay | Admitting: Family Medicine

## 2021-08-14 VITALS — BP 120/78 | HR 82 | Temp 97.8°F | Ht 74.0 in | Wt 200.0 lb

## 2021-08-14 DIAGNOSIS — Z9889 Other specified postprocedural states: Secondary | ICD-10-CM

## 2021-08-14 DIAGNOSIS — Z7189 Other specified counseling: Secondary | ICD-10-CM

## 2021-08-14 DIAGNOSIS — J309 Allergic rhinitis, unspecified: Secondary | ICD-10-CM

## 2021-08-14 DIAGNOSIS — Z0000 Encounter for general adult medical examination without abnormal findings: Secondary | ICD-10-CM | POA: Diagnosis not present

## 2021-08-14 MED ORDER — MONTELUKAST SODIUM 10 MG PO TABS
ORAL_TABLET | Freq: Every day | ORAL | 3 refills | Status: DC
  Filled 2021-08-14: qty 90, 90d supply, fill #0
  Filled 2021-11-20: qty 90, 90d supply, fill #1
  Filled 2022-02-19: qty 90, 90d supply, fill #2
  Filled 2022-05-17: qty 90, 90d supply, fill #3

## 2021-08-14 NOTE — Patient Instructions (Signed)
Update me as needed.  Thanks for your effort.  Take care.  Glad to see you. 

## 2021-08-14 NOTE — Progress Notes (Addendum)
CPE- See plan.  Routine anticipatory guidance given to patient.  See health maintenance.  The possibility exists that previously documented standard health maintenance information may have been brought forward from a previous encounter into this note.  If needed, that same information has been updated to reflect the current situation based on today's encounter.   ? ?Tetanus 2019 ?Flu yearly ?PNA due at 66.   ?Shingrix 2020 ?covid vaccine 2021 ?PSA screening d/w pt.  PSA wnl 2023 ?Colonoscopy 2019.  ?Living will d/w pt.  Wife designated if patient were incapacitated.  Then daughter designated if patient needed.   ?Diet and exercise d/w pt.   ? ?Recent labs d/w pt.   ? ?He is going to move to another house but stay locally.  This goes. ? ?BP 130/70s at home.  He was able to hike 10 miles in [place] at elevation.  D/w pt about prev mitral valve repair.   ? ?Still on antihistamines/singulair at baseline due to allergies.  Not sedated.  D/w pt about attempting taper periodically.   ? ?PMH and SH reviewed ? ?Meds, vitals, and allergies reviewed.  ? ?ROS: Per HPI.  Unless specifically indicated otherwise in HPI, the patient denies: ? ?General: fever. ?Eyes: acute vision changes ?ENT: sore throat ?Cardiovascular: chest pain ?Respiratory: SOB ?GI: vomiting ?GU: dysuria ?Musculoskeletal: acute back pain ?Derm: acute rash ?Neuro: acute motor dysfunction ?Psych: worsening mood ?Endocrine: polydipsia ?Heme: bleeding ?Allergy: hayfever ? ?GEN: nad, alert and oriented ?HEENT: ncat ?NECK: supple w/o LA ?CV: rrr.  Soft systolic murmur heard at baseline as expected. ?PULM: ctab, no inc wob ?ABD: soft, +bs ?EXT: no edema ?SKIN: no acute rash ?

## 2021-08-15 ENCOUNTER — Other Ambulatory Visit: Payer: Self-pay

## 2021-08-16 ENCOUNTER — Other Ambulatory Visit: Payer: Self-pay

## 2021-08-16 NOTE — Assessment & Plan Note (Addendum)
Tetanus 2019 ?Flu yearly ?PNA due at 66.   ?Shingrix 2020 ?covid vaccine 2021 ?PSA screening d/w pt.  PSA wnl 2023 ?Colonoscopy 2019.  ?Living will d/w pt.  Wife designated if patient were incapacitated.  Then daughter designated if patient needed.   ?Diet and exercise d/w pt.   ?

## 2021-08-16 NOTE — Assessment & Plan Note (Signed)
Living will d/w pt.  Wife designated if patient were incapacitated.  Then daughter designated if patient needed.   ?

## 2021-08-16 NOTE — Assessment & Plan Note (Signed)
BP 130/70s at home.  He was able to hike 10 miles in [place] at elevation.  D/w pt about prev mitral valve repair.  Overall doing well and I do not think it makes sense to change his medication.  He had routine cardiology follow-up. ?

## 2021-08-16 NOTE — Assessment & Plan Note (Signed)
Still on antihistamines/singulair at baseline due to allergies.  Not sedated.  D/w pt about attempting taper periodically.   ?

## 2021-10-31 DIAGNOSIS — I10 Essential (primary) hypertension: Secondary | ICD-10-CM | POA: Insufficient documentation

## 2021-11-20 ENCOUNTER — Other Ambulatory Visit: Payer: Self-pay

## 2021-12-28 ENCOUNTER — Other Ambulatory Visit: Payer: Self-pay | Admitting: Family Medicine

## 2021-12-28 ENCOUNTER — Other Ambulatory Visit: Payer: Self-pay

## 2021-12-28 MED ORDER — AMOXICILLIN-POT CLAVULANATE 875-125 MG PO TABS
1.0000 | ORAL_TABLET | Freq: Two times a day (BID) | ORAL | 1 refills | Status: DC
  Filled 2021-12-28: qty 20, 10d supply, fill #0
  Filled 2022-05-17: qty 20, 10d supply, fill #1

## 2021-12-28 NOTE — Progress Notes (Signed)
D/w pt about having augmentin on hand re: recurrent sinus infection and he'll update me as needed.  Rx sent.

## 2022-02-06 ENCOUNTER — Other Ambulatory Visit: Payer: Self-pay

## 2022-02-06 ENCOUNTER — Other Ambulatory Visit: Payer: Self-pay | Admitting: Family Medicine

## 2022-02-06 DIAGNOSIS — I10 Essential (primary) hypertension: Secondary | ICD-10-CM

## 2022-02-06 MED ORDER — VALSARTAN 80 MG PO TABS
80.0000 mg | ORAL_TABLET | Freq: Every day | ORAL | 1 refills | Status: DC
  Filled 2022-02-06: qty 90, 90d supply, fill #0

## 2022-02-06 NOTE — Progress Notes (Signed)
Patient has history of elevated blood pressure on multiple home checks.  Discussed starting valsartan 80 mg daily and then recheck labs about 1 week after that.  See orders.  He can update me as needed.

## 2022-02-15 ENCOUNTER — Other Ambulatory Visit: Payer: Self-pay

## 2022-02-15 DIAGNOSIS — I10 Essential (primary) hypertension: Secondary | ICD-10-CM

## 2022-02-15 LAB — BASIC METABOLIC PANEL
BUN: 16 mg/dL (ref 6–23)
CO2: 29 mEq/L (ref 19–32)
Calcium: 9.6 mg/dL (ref 8.4–10.5)
Chloride: 104 mEq/L (ref 96–112)
Creatinine, Ser: 0.87 mg/dL (ref 0.40–1.50)
GFR: 90.95 mL/min (ref >60.00)
Glucose, Bld: 97 mg/dL (ref 70–99)
Potassium: 4.4 mEq/L (ref 3.5–5.1)
Sodium: 138 mEq/L (ref 135–145)

## 2022-02-19 ENCOUNTER — Other Ambulatory Visit: Payer: Self-pay

## 2022-02-19 ENCOUNTER — Other Ambulatory Visit: Payer: Self-pay | Admitting: Family Medicine

## 2022-02-19 DIAGNOSIS — I10 Essential (primary) hypertension: Secondary | ICD-10-CM

## 2022-02-19 MED ORDER — BENZONATATE 200 MG PO CAPS
200.0000 mg | ORAL_CAPSULE | Freq: Three times a day (TID) | ORAL | 1 refills | Status: DC | PRN
  Filled 2022-02-19: qty 90, 30d supply, fill #0

## 2022-02-19 MED ORDER — VALSARTAN 160 MG PO TABS: 160.0000 mg | ORAL_TABLET | Freq: Every day | ORAL | Status: DC

## 2022-02-19 NOTE — Progress Notes (Signed)
D/w pt.  Tessalon rx sent, to have on hand for prn use.  Diovan dose inc to '160mg'$  based on BP readings at home.  He'll update me as needed.

## 2022-03-12 ENCOUNTER — Other Ambulatory Visit: Payer: Self-pay | Admitting: Family Medicine

## 2022-03-12 ENCOUNTER — Other Ambulatory Visit: Payer: Self-pay

## 2022-03-12 DIAGNOSIS — I10 Essential (primary) hypertension: Secondary | ICD-10-CM

## 2022-03-12 MED ORDER — VALSARTAN 160 MG PO TABS
160.0000 mg | ORAL_TABLET | Freq: Every day | ORAL | 3 refills | Status: DC
  Filled 2022-03-12: qty 90, 90d supply, fill #0
  Filled 2022-06-05: qty 90, 90d supply, fill #1
  Filled 2022-09-06: qty 90, 90d supply, fill #2
  Filled 2022-12-05: qty 90, 90d supply, fill #3

## 2022-03-12 NOTE — Progress Notes (Signed)
D/w pt.  BP controlled.  Valsartan refill sent, '160mg'$ . #90, 3rf.

## 2022-04-16 ENCOUNTER — Encounter: Payer: Self-pay | Admitting: Cardiovascular Disease

## 2022-04-25 ENCOUNTER — Ambulatory Visit (INDEPENDENT_AMBULATORY_CARE_PROVIDER_SITE_OTHER): Payer: 59

## 2022-04-25 DIAGNOSIS — Z23 Encounter for immunization: Secondary | ICD-10-CM

## 2022-05-17 ENCOUNTER — Other Ambulatory Visit: Payer: Self-pay

## 2022-05-17 ENCOUNTER — Telehealth: Payer: Self-pay

## 2022-05-17 DIAGNOSIS — D2261 Melanocytic nevi of right upper limb, including shoulder: Secondary | ICD-10-CM | POA: Diagnosis not present

## 2022-05-17 DIAGNOSIS — L821 Other seborrheic keratosis: Secondary | ICD-10-CM | POA: Diagnosis not present

## 2022-05-17 DIAGNOSIS — D2262 Melanocytic nevi of left upper limb, including shoulder: Secondary | ICD-10-CM | POA: Diagnosis not present

## 2022-05-17 DIAGNOSIS — D2272 Melanocytic nevi of left lower limb, including hip: Secondary | ICD-10-CM | POA: Diagnosis not present

## 2022-05-17 DIAGNOSIS — D2271 Melanocytic nevi of right lower limb, including hip: Secondary | ICD-10-CM | POA: Diagnosis not present

## 2022-05-17 DIAGNOSIS — D225 Melanocytic nevi of trunk: Secondary | ICD-10-CM | POA: Diagnosis not present

## 2022-05-17 MED ORDER — AMOXICILLIN 500 MG PO CAPS
2000.0000 mg | ORAL_CAPSULE | Freq: Every day | ORAL | 1 refills | Status: DC | PRN
  Filled 2022-05-17: qty 20, 5d supply, fill #0

## 2022-05-17 NOTE — Telephone Encounter (Signed)
Patient requesting refill on amoxicillin 500 mg capsules for his upcoming dental work. Erx has been sent to [place].

## 2022-05-30 ENCOUNTER — Encounter: Payer: Self-pay | Admitting: Cardiovascular Disease

## 2022-05-30 ENCOUNTER — Ambulatory Visit: Payer: 59 | Attending: Cardiovascular Disease | Admitting: Cardiovascular Disease

## 2022-05-30 VITALS — BP 122/70 | HR 78 | Ht 74.0 in | Wt 206.4 lb

## 2022-05-30 DIAGNOSIS — Z9889 Other specified postprocedural states: Secondary | ICD-10-CM

## 2022-05-30 NOTE — Progress Notes (Signed)
Cardiology Office Note:    Date:  05/30/2022   ID:  Ryan Carbon, [name], DOB 08/28/1955, MRN [id number]  PCP:  Tonia Ghent, [name]   [place] Providers Cardiologist:  Sherren Mocha, [name]     Referring [name]: Tonia Ghent, [name]   Chief Complaint  Patient presents with   Follow-up    Mitral Valve Disease    History of Present Illness:    Ryan Carbon, [name] is a 67 y.o. male with a hx of mitral valve prolapse with severe mitral regurgitation status post minimally invasive mitral valve repair in 2021.  The patient was treated with a complex repair including placement of a 38 mm Sorin annuloplasty ring via a right minithoracotomy approach.  His postoperative echo showed no residual mitral regurgitation and a transmitral mean gradient of 6 mmHg.   The patient is here alone today.  He has been doing well.  He has some shortness of breath when he first starts his brisk walk, but this eases up as he continues on.  He is able to walk 5 or 6 miles without significant limitation.  States that if he is walking up a hill or early on in his walk, he might have to slow his pace or stop and rest for a minute.  No chest pain, edema, or heart palpitations.  His heart rate has slowed some as he is gotten further out from his surgery.  He was initially tachycardic after surgery.  He has had no other problems and continues on his current medical regimen.  He remains very active between exercise, hiking, and doing things with his 51-year-old grandson.  Past Medical History:  Diagnosis Date   ALLERGIC RHINITIS 11/25/2008   Allergy    Asthma    exercised induced and cold induced   Atypical nevus of scapular region 01/12/2017   GERD (gastroesophageal reflux disease)    occasional   Hearing loss, right 04/11/2012   Multiple surgeries in the past, partial hearing loss   Hematospermia 02/19/2018   History of brain concussion 01/18/2018   Hyperlipidemia LDL goal [id number]   Lichen planus  [id number]   Qualifier: Diagnosis of  By: Council Mechanic [name], Hilaria Ota    Mitral regurgitation, myxomatous 04/11/2012   Mitral valve prolapse 04/11/2012   Severe, annual echos   S/P minimally-invasive mitral valve repair 05/21/2019   Complex valvuloplasty including triangular resection of flail P1 segment, artificial Gore-tex neochord placement x6, suture plication of P1 and P2 segments of posterior leaflet and 38 mm Sorin Memo 4D ring annuloplasty via right mini thoracotomy approach    Past Surgical History:  Procedure Laterality Date    tendon repair quadracep Left 2013   APPENDECTOMY     Bilateral laparoscopic hernia repairs  1992   CLIPPING OF ATRIAL APPENDAGE N/A 05/21/2019   Procedure: CLIPPING OF ATRIAL APPENDAGE USING ATRICURE CLIP SIZE 45MM;  Surgeon: Rexene Alberts, [name];  Location: [facility];  Service: Open Heart Surgery;  Laterality: N/A;   COLONOSCOPY  11/17/07   Dr. Henrene Pastor   Echo (other)     positive for MVP, markedly myxomatous, marked prolapse TVP with Regurg (03/1990)   Echo (other)  06/21/2000   mitral valve thickened, myxomatous vs degenerative, moderate MVP, moderate MR   Echo (other)  07/26/2006   LVF normal, EF 55-65%, Triv AR mod MR, marked posterior leaflet MVP   Echo (other)  06/15/2009   no change (EF 55-60% Mild Myxomatous Mitral Valve Mod MVP mod MR  Mild LAE)   MITRAL VALVE REPAIR Right 05/21/2019   Procedure: MINIMALLY INVASIVE MITRAL VALVE REPAIR (MVR) USING MEMO 4D MITRAL RING SIZE 38MM;  Surgeon: Rexene Alberts, [name];  Location: [facility];  Service: Open Heart Surgery;  Laterality: Right;   Nasal Septoplasty (other)  2008   Dr. Kem Kays RECONSTRUCTION     right ear   RIGHT/LEFT HEART CATH AND CORONARY ANGIOGRAPHY N/A 05/06/2019   Procedure: RIGHT/LEFT HEART CATH AND CORONARY ANGIOGRAPHY;  Surgeon: Sherren Mocha, [name];  Location: [facility];  Service: Cardiovascular;  Laterality: N/A;   STAPEDECTOMY     TEE WITHOUT CARDIOVERSION N/A 05/06/2019   Procedure:  TRANSESOPHAGEAL ECHOCARDIOGRAM (TEE);  Surgeon: Donato Heinz, [name];  Location[facility] ENDOSCOPY;  Service: Endoscopy;  Laterality: N/A;   TEE WITHOUT CARDIOVERSION N/A 05/21/2019   Procedure: TRANSESOPHAGEAL ECHOCARDIOGRAM (TEE);  Surgeon: Rexene Alberts, [name];  Location: [facility];  Service: Open Heart Surgery;  Laterality: N/A;   TONSILLECTOMY     age 14    Current Medications: Current Meds  Medication Sig   acetaminophen (TYLENOL) 500 MG tablet Take 500-1,000 mg by mouth every 6 (six) hours as needed (for pain.).   albuterol (PROAIR HFA) 108 (90 Base) MCG/ACT inhaler Inhale 1-2 puffs into the lungs every 6 (six) hours as needed for wheezing or shortness of breath.   aspirin EC 81 MG tablet Take 81 mg by mouth every other day. Swallow whole.   cetirizine (ZYRTEC) 10 MG tablet Take 10 mg by mouth every evening.   fexofenadine (ALLEGRA) 180 MG tablet Take 180 mg by mouth daily as needed (allergies.).   fluticasone (FLONASE) 50 MCG/ACT nasal spray Place 2 sprays into both nostrils daily.   loratadine (CLARITIN) 10 MG tablet Take 10 mg by mouth every evening.   montelukast (SINGULAIR) 10 MG tablet TAKE 1 TABLET BY MOUTH ONCE DAILY   triamcinolone cream (KENALOG) 0.1 % Apply 1 application topically 2 (two) times daily as needed.   valsartan (DIOVAN) 160 MG tablet Take 1 tablet (160 mg total) by mouth daily.   [DISCONTINUED] amoxicillin (AMOXIL) 500 MG capsule Take 4 capsules (2,000 mg total) by mouth daily as needed (for dental work).   [DISCONTINUED] benzonatate (TESSALON) 200 MG capsule Take 1 capsule (200 mg total) by mouth 3 (three) times daily as needed.     Allergies:   Patient has no known allergies.   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   Married to [place], [place]   1 living child.     2 children initially.     Daughter: Vladimir Creeks   1 deceased son, Shanon Brow   4 grandkids   Social Determinants of Radio broadcast assistant Strain: Not on [employer] Insecurity: Not on file  Transportation Needs: Not on file  Physical  Activity: Not on file  Stress: Not on file  Social Connections: Not on file     Family History: The patient's family history includes Anxiety disorder in his mother; Breast cancer in his mother; Breast cancer (age of onset: 73) in his sister; Congestive Heart Failure in his father; Diabetes in his father; Hyperparathyroidism in his sister; Hypertension in his mother; Ovarian cancer in his sister; Peripheral Artery Disease (age of onset: 17) in his father; Prostate cancer in his brother. There is no history of Colon cancer, Colon polyps, Esophageal cancer, Stomach cancer, or Rectal cancer.  ROS:   Please see the history of present illness.    All other systems reviewed and are negative.  EKGs/Labs/Other Studies Reviewed:    The following studies were reviewed today: Echo 05/10/2021: 1. Left ventricular ejection fraction, by estimation, is 65 to 70%. The  left ventricle has hyperdynamic function. The left ventricle has no  regional wall motion abnormalities. Left ventricular diastolic parameters  are consistent with Grade I diastolic  dysfunction (impaired relaxation). The average left ventricular global  longitudinal strain is 21.6 %. The global longitudinal strain is normal.   2. Right ventricular systolic function is normal. The right ventricular  size is normal. There is mildly elevated pulmonary artery  systolic  pressure. The estimated right ventricular systolic pressure is 0000000 mmHg.   3. Left atrial size was mild to moderately dilated.   4. Right atrial size was mildly dilated.   5. Status post mitral valve repair. Mild stenosis with mean gradient 4  mmHg. There is at least mild mitral regurgitation that is not  well-visualized, could be worse based on the thickness of the CW doppler  jet.   6. The aortic valve is tricuspid. Aortic valve regurgitation is trivial.  No aortic stenosis is present.   7. The inferior vena cava is normal in size with greater than 50%  respiratory variability, suggesting right atrial pressure of 3 mmHg.   Cath 05/06/2019: Prox Cx to Mid Cx lesion is 20% stenosed.   1. Mild nonobstructive CAD with mild stenosis of the mid-LAD and mid-LCx, no more than 30% vessel narrowing in either vessel 2. Normal right heart pressures, normal LVEDP/PCWP 3. Known severe mitral regurgitation by echo assessment, with well-compensated hemodynamics   Plan: cardiac surgical evaluation for mitral valve repair  EKG:  EKG is ordered today.  The ekg ordered today demonstrates NSR 78 bpm, occasional PAC, rightward axis, otherwise normal  Recent Labs: 02/15/2022: BUN 16; Creatinine, Ser 0.87; Potassium 4.4; Sodium 138  Recent Lipid Panel    Component Value Date/Time   CHOL 243 (A) 06/28/2020 0000   TRIG 146 06/28/2020 0000   HDL 69 06/28/2020 0000   CHOLHDL 3.1 12/24/2018 1241   VLDL 22.6 04/10/2012 1250   LDLCALC 148 06/28/2020 0000   LDLCALC 130 (H) 12/24/2018 1241   LDLDIRECT 130.1 04/10/2012 1250     Risk Assessment/Calculations:                Physical Exam:    VS:  BP 122/70   Pulse 78   Ht 6' 2"$  (1.88 m)   Wt 206 lb 6.4 oz (93.6 kg)   SpO2 96%   BMI 26.50 kg/m     Wt Readings from Last 3 Encounters:  05/30/22 206 lb 6.4 oz (93.6 kg)  08/14/21 200 lb (90.7 kg)  05/10/21 202 lb 6.4 oz (91.8 kg)     GEN:  Well nourished, well developed in no acute  distress  HEENT: Normal NECK: No JVD; No carotid bruits LYMPHATICS: No lymphadenopathy CARDIAC: RRR, soft 1/6 early systolic murmur at the apex unchanged from last year's exam RESPIRATORY:  Clear to auscultation without rales, wheezing or rhonchi  ABDOMEN: Soft, non-tender, non-distended MUSCULOSKELETAL:  No edema; No deformity  SKIN: Warm and dry NEUROLOGIC:  Alert and oriented x 3 PSYCHIATRIC:  Normal affect   ASSESSMENT:    1. S/P MVR (mitral valve repair)    PLAN:    In order of problems listed above:  Patient doing well.  No change in symptoms reported since his visit last year.  Echo showed mild MS and MR as outlined above.  Mean transmitral gradient has been measured between 4 and 6 mmHg.  Recommended a repeat echocardiogram next year before his office visit.  Continue to follow SBE prophylaxis is indicated.  No other changes recommended.  Continue with his exercise program.           Medication Adjustments/Labs and Tests Ordered: Current medicines are reviewed at length with the patient today.  Concerns regarding medicines are outlined above.  Orders Placed This Encounter  Procedures   EKG 12-Lead   No orders of the defined types were placed in this encounter.   Patient Instructions  Medication Instructions:  Your physician recommends that you continue on your current medications as directed. Please refer to the Current Medication list given to you today.  *If you need a refill on your cardiac medications before your next appointment, please call your pharmacy*   Lab Work: NONE If you have labs (blood work) drawn today and your tests are completely normal, you will receive your results only by: [place] (if you have MyChart) OR A paper copy in the mail If you have any lab test that is abnormal or we need to change your treatment, we will call you to review the results.  Testing/Procedures: ECHO (to be done in 1 year, same day as appt) Your physician  has requested that you have an echocardiogram. Echocardiography is a painless test that uses sound waves to create images of your heart. It provides your doctor with information about the size and shape of your heart and how well your heart's chambers and valves are working. This procedure takes approximately one hour. There are no restrictions for this procedure. Please do NOT wear cologne, perfume, aftershave, or lotions (deodorant is allowed). Please arrive 15 minutes prior to your appointment time.  Follow-Up: At [facility], you and your health needs are our priority.  As part of our continuing mission to provide you with exceptional heart care, we have created designated Provider Care Teams.  These Care Teams include your primary Cardiologist (physician) and Advanced Practice Providers (APPs -  Physician Assistants and Nurse Practitioners) who all work together to provide you with the care you need, when you need it.  We recommend signing up for the patient portal called "MyChart".  Sign up information is provided on this After Visit Summary.  MyChart is used to connect with patients for Virtual Visits (Telemedicine).  Patients are able to view lab/test results, encounter notes, upcoming appointments, etc.  Non-urgent messages can be sent to your provider as well.   To learn more about what you can do with MyChart, go to [email or web address].    Your next appointment:   1 year(s)  Provider:   Sherren Mocha, [name]        Signed, Sherren Mocha, [name]  05/30/2022 2:46 PM    [facility]

## 2022-05-30 NOTE — Patient Instructions (Signed)
Medication Instructions:  Your physician recommends that you continue on your current medications as directed. Please refer to the Current Medication list given to you today.  *If you need a refill on your cardiac medications before your next appointment, please call your pharmacy*   Lab Work: NONE If you have labs (blood work) drawn today and your tests are completely normal, you will receive your results only by: [place] (if you have MyChart) OR A paper copy in the mail If you have any lab test that is abnormal or we need to change your treatment, we will call you to review the results.  Testing/Procedures: ECHO (to be done in 1 year, same day as appt) Your physician has requested that you have an echocardiogram. Echocardiography is a painless test that uses sound waves to create images of your heart. It provides your doctor with information about the size and shape of your heart and how well your heart's chambers and valves are working. This procedure takes approximately one hour. There are no restrictions for this procedure. Please do NOT wear cologne, perfume, aftershave, or lotions (deodorant is allowed). Please arrive 15 minutes prior to your appointment time.  Follow-Up: At [facility], you and your health needs are our priority.  As part of our continuing mission to provide you with exceptional heart care, we have created designated Provider Care Teams.  These Care Teams include your primary Cardiologist (physician) and Advanced Practice Providers (APPs -  Physician Assistants and Nurse Practitioners) who all work together to provide you with the care you need, when you need it.  We recommend signing up for the patient portal called "MyChart".  Sign up information is provided on this After Visit Summary.  MyChart is used to connect with patients for Virtual Visits (Telemedicine).  Patients are able to view lab/test results, encounter notes, upcoming appointments, etc.   Non-urgent messages can be sent to your provider as well.   To learn more about what you can do with MyChart, go to [email or web address].    Your next appointment:   1 year(s)  Provider:   Sherren Mocha, MD

## 2022-06-19 LAB — PSA: PSA: 0.7

## 2022-06-19 LAB — HEPATIC FUNCTION PANEL
ALT: 37 U/L (ref 10–40)
AST: 35 (ref 14–40)

## 2022-06-19 LAB — BASIC METABOLIC PANEL
Creatinine: 0.8 (ref 0.6–1.3)
EGFR: 100
Glucose: 86

## 2022-06-19 LAB — LIPID PANEL
Cholesterol: 219 — AB (ref 0–200)
HDL: 79 — AB (ref 35–70)
LDL Cholesterol: 117
Triglycerides: 131 (ref 40–160)

## 2022-06-19 LAB — CBC AND DIFFERENTIAL: Hemoglobin: 15 (ref 13.5–17.5)

## 2022-06-19 LAB — HEMOGLOBIN A1C: Hemoglobin A1C: 5.3

## 2022-06-19 LAB — TSH: TSH: 2.72 (ref 0.41–5.90)

## 2022-07-02 DIAGNOSIS — H2512 Age-related nuclear cataract, left eye: Secondary | ICD-10-CM | POA: Diagnosis not present

## 2022-07-02 DIAGNOSIS — H2511 Age-related nuclear cataract, right eye: Secondary | ICD-10-CM | POA: Diagnosis not present

## 2022-07-02 DIAGNOSIS — H25041 Posterior subcapsular polar age-related cataract, right eye: Secondary | ICD-10-CM | POA: Diagnosis not present

## 2022-07-18 ENCOUNTER — Encounter: Payer: Self-pay | Admitting: Ophthalmology

## 2022-07-18 DIAGNOSIS — H2512 Age-related nuclear cataract, left eye: Secondary | ICD-10-CM | POA: Diagnosis not present

## 2022-07-18 DIAGNOSIS — H25041 Posterior subcapsular polar age-related cataract, right eye: Secondary | ICD-10-CM | POA: Diagnosis not present

## 2022-07-18 DIAGNOSIS — H2511 Age-related nuclear cataract, right eye: Secondary | ICD-10-CM | POA: Diagnosis not present

## 2022-07-19 NOTE — Anesthesia Preprocedure Evaluation (Addendum)
Anesthesia Evaluation  Patient identified by MRN, date of birth, ID band Patient awake    Reviewed: Allergy & Precautions, H&P , NPO status , Patient's Chart, lab work & pertinent test results  Airway Mallampati: III  TM Distance: >3 FB Neck ROM: Full    Dental no notable dental hx.    Pulmonary neg pulmonary ROS, asthma    Pulmonary exam normal breath sounds clear to auscultation       Cardiovascular Exercise Tolerance: Good Normal cardiovascular exam Rhythm:Regular Rate:Normal  S/P ,minimally invasive mitral valve repair 2021 Echo 05/10/21 EF 65-70%, hyperdynamic, grade I diastolic dysfunction, RVSP 36, LA mild to moderately dilated, RA mildly dilated, S/P mitral valve repair, mild mitral stenosis, mild mitral regurg Mild non-obstructive CAD from heart cath 2021   Neuro/Psych Hx concussion negative neurological ROS  negative psych ROS   GI/Hepatic Neg liver ROS,GERD  ,,  Endo/Other  negative endocrine ROS    Renal/GU negative Renal ROS  negative genitourinary   Musculoskeletal negative musculoskeletal ROS (+)    Abdominal   Peds negative pediatric ROS (+)  Hematology negative hematology ROS (+)   Anesthesia Other Findings ALLERGIC RHINITIS  Hearing loss, right Allergy Lichen planus Hyperlipidemia LDL goal <130 History of brain concussion Hematospermia  Atypical nevus of scapular region Asthma  GERD (gastroesophageal reflux disease) S/P minimally-invasive mitral valve repair Wears hearing aid in both ears    Reproductive/Obstetrics negative OB ROS                              Anesthesia Physical Anesthesia Plan  ASA: 3  Anesthesia Plan: MAC   Post-op Pain Management:    Induction: Intravenous  PONV Risk Score and Plan:   Airway Management Planned: Natural Airway and Nasal Cannula  Additional Equipment:   Intra-op Plan:   Post-operative Plan:   Informed Consent:  I have reviewed the patients History and Physical, chart, labs and discussed the procedure including the risks, benefits and alternatives for the proposed anesthesia with the patient or authorized representative who has indicated his/her understanding and acceptance.     Dental Advisory Given  Plan Discussed with: Anesthesiologist, CRNA and Surgeon  Anesthesia Plan Comments: (Patient consented for risks of anesthesia including but not limited to:  - adverse reactions to medications - damage to eyes, teeth, lips or other oral mucosa - nerve damage due to positioning  - sore throat or hoarseness - Damage to heart, brain, nerves, lungs, other parts of body or loss of life  Patient voiced understanding.)         Anesthesia Quick Evaluation

## 2022-07-23 NOTE — Discharge Instructions (Signed)

## 2022-07-24 ENCOUNTER — Ambulatory Visit: Payer: 59 | Admitting: Anesthesiology

## 2022-07-24 ENCOUNTER — Encounter
Admission: RE | Disposition: A | Discharge status: Home / Self Care | Payer: Self-pay | Source: Home / Self Care | Attending: Ophthalmology

## 2022-07-24 ENCOUNTER — Encounter: Payer: Self-pay | Admitting: Ophthalmology

## 2022-07-24 ENCOUNTER — Other Ambulatory Visit: Payer: Self-pay

## 2022-07-24 ENCOUNTER — Ambulatory Visit
Admission: RE | Admit: 2022-07-24 | Discharge: 2022-07-24 | Disposition: A | Discharge status: Home / Self Care | Payer: 59 | Attending: Ophthalmology | Admitting: Ophthalmology

## 2022-07-24 DIAGNOSIS — I340 Nonrheumatic mitral (valve) insufficiency: Secondary | ICD-10-CM | POA: Diagnosis not present

## 2022-07-24 DIAGNOSIS — H2511 Age-related nuclear cataract, right eye: Secondary | ICD-10-CM | POA: Diagnosis not present

## 2022-07-24 DIAGNOSIS — H269 Unspecified cataract: Secondary | ICD-10-CM | POA: Diagnosis not present

## 2022-07-24 DIAGNOSIS — J45909 Unspecified asthma, uncomplicated: Secondary | ICD-10-CM | POA: Insufficient documentation

## 2022-07-24 DIAGNOSIS — H9191 Unspecified hearing loss, right ear: Secondary | ICD-10-CM | POA: Diagnosis not present

## 2022-07-24 HISTORY — DX: Presence of external hearing-aid: Z97.4

## 2022-07-24 HISTORY — PX: CATARACT EXTRACTION W/PHACO: SHX586

## 2022-07-24 SURGERY — PHACOEMULSIFICATION, CATARACT, WITH IOL INSERTION
Anesthesia: Monitor Anesthesia Care | Site: Eye | Laterality: Right

## 2022-07-24 MED ORDER — FENTANYL CITRATE (PF) 100 MCG/2ML IJ SOLN
INTRAMUSCULAR | Status: DC | PRN
  Administered 2022-07-24: 50 ug via INTRAVENOUS

## 2022-07-24 MED ORDER — SIGHTPATH DOSE#1 NA CHONDROIT SULF-NA HYALURON 40-17 MG/ML IO SOLN
INTRAOCULAR | Status: DC | PRN
  Administered 2022-07-24: 1 mL via INTRAOCULAR

## 2022-07-24 MED ORDER — MOXIFLOXACIN HCL 0.5 % OP SOLN
OPHTHALMIC | Status: DC | PRN
  Administered 2022-07-24: .2 mL via OPHTHALMIC

## 2022-07-24 MED ORDER — LACTATED RINGERS IV SOLN: INTRAVENOUS | Status: DC

## 2022-07-24 MED ORDER — ARMC OPHTHALMIC DILATING DROPS
1.0000 | OPHTHALMIC | Status: DC | PRN
  Administered 2022-07-24 (×3): 1 via OPHTHALMIC

## 2022-07-24 MED ORDER — BRIMONIDINE TARTRATE-TIMOLOL 0.2-0.5 % OP SOLN
OPHTHALMIC | Status: DC | PRN
  Administered 2022-07-24: 1 [drp] via OPHTHALMIC

## 2022-07-24 MED ORDER — MIDAZOLAM HCL 2 MG/2ML IJ SOLN
INTRAMUSCULAR | Status: DC | PRN
  Administered 2022-07-24: 2 mg via INTRAVENOUS

## 2022-07-24 MED ORDER — SIGHTPATH DOSE#1 BSS IO SOLN
INTRAOCULAR | Status: DC | PRN
  Administered 2022-07-24: 15 mL

## 2022-07-24 MED ORDER — TETRACAINE HCL 0.5 % OP SOLN: 1.0000 [drp] | OPHTHALMIC | Status: DC | PRN

## 2022-07-24 MED ORDER — SIGHTPATH DOSE#1 BSS IO SOLN
INTRAOCULAR | Status: DC | PRN
  Administered 2022-07-24: 1 mL via INTRAMUSCULAR

## 2022-07-24 MED ORDER — TETRACAINE HCL 0.5 % OP SOLN
1.0000 [drp] | OPHTHALMIC | Status: DC | PRN
  Administered 2022-07-24 (×3): 1 [drp] via OPHTHALMIC

## 2022-07-24 MED ORDER — SIGHTPATH DOSE#1 BSS IO SOLN
INTRAOCULAR | Status: DC | PRN
  Administered 2022-07-24: 65 mL via OPHTHALMIC

## 2022-07-24 SURGICAL SUPPLY — 8 items
CATARACT SUITE SIGHTPATH (MISCELLANEOUS) ×1 IMPLANT
FEE CATARACT SUITE SIGHTPATH (MISCELLANEOUS) ×1 IMPLANT
GLOVE BIOGEL PI IND STRL 8 (GLOVE) ×1 IMPLANT
GLOVE SURG ENC TEXT LTX SZ8 (GLOVE) ×1 IMPLANT
LENS IOL TECNIS EYHANCE 19.0 (Intraocular Lens) IMPLANT
NDL FILTER BLUNT 18X1 1/2 (NEEDLE) ×1 IMPLANT
NEEDLE FILTER BLUNT 18X1 1/2 (NEEDLE) ×1 IMPLANT
SYR 3ML LL SCALE MARK (SYRINGE) ×1 IMPLANT

## 2022-07-24 NOTE — Anesthesia Postprocedure Evaluation (Signed)
Anesthesia Post Note  Patient: [name], MD  Procedure(s) Performed: CATARACT EXTRACTION PHACO AND INTRAOCULAR LENS PLACEMENT (IOC) RIGHT  6.47  00:42.8 (Right: Eye)  Patient location during evaluation: PACU Anesthesia Type: MAC Level of consciousness: awake and alert Pain management: pain level controlled Vital Signs Assessment: post-procedure vital signs reviewed and stable Respiratory status: spontaneous breathing, nonlabored ventilation, respiratory function stable and patient connected to nasal cannula oxygen Cardiovascular status: stable and blood pressure returned to baseline Postop Assessment: no apparent nausea or vomiting Anesthetic complications: no   No notable events documented.   Last Vitals:  Vitals:   07/24/22 1242 07/24/22 1245  BP: 132/80 128/81  Pulse: 73 73  Resp: 14 (!) 21  Temp: 36.5 C 36.5 C  SpO2: 99% 99%    Last Pain:  Vitals:   07/24/22 1245  TempSrc:   PainSc: 0-No pain                 Charlise Giovanetti C Rilee Wendling

## 2022-07-24 NOTE — Transfer of Care (Signed)
Immediate Anesthesia Transfer of Care Note  Patient: [name], MD  Procedure(s) Performed: CATARACT EXTRACTION PHACO AND INTRAOCULAR LENS PLACEMENT (IOC) RIGHT  6.47  00:42.8 (Right: Eye)  Patient Location: PACU  Anesthesia Type: MAC  Level of Consciousness: awake, alert  and patient cooperative  Airway and Oxygen Therapy: Patient Spontanous Breathing and Patient connected to supplemental oxygen  Post-op Assessment: Post-op Vital signs reviewed, Patient's Cardiovascular Status Stable, Respiratory Function Stable, Patent Airway and No signs of Nausea or vomiting  Post-op Vital Signs: Reviewed and stable  Complications: No notable events documented.

## 2022-07-24 NOTE — Op Note (Signed)
PREOPERATIVE DIAGNOSIS:  Nuclear sclerotic cataract of the right eye.   POSTOPERATIVE DIAGNOSIS:  H25.041 Cataract   OPERATIVE PROCEDURE:ORPROCALL@   SURGEON:  Galen Manila, MD.   ANESTHESIA:  Anesthesiologist: Marisue Humble, MD CRNA: Andee Poles, CRNA  1.      Managed anesthesia care. 2.      0.61ml of Shugarcaine was instilled in the eye following the paracentesis.   COMPLICATIONS:  None.   TECHNIQUE:   Stop and chop   DESCRIPTION OF PROCEDURE:  The patient was examined and consented in the preoperative holding area where the aforementioned topical anesthesia was applied to the right eye and then brought back to the Operating Room where the right eye was prepped and draped in the usual sterile ophthalmic fashion and a lid speculum was placed. A paracentesis was created with the side port blade and the anterior chamber was filled with viscoelastic. A near clear corneal incision was performed with the steel keratome. A continuous curvilinear capsulorrhexis was performed with a cystotome followed by the capsulorrhexis forceps. Hydrodissection and hydrodelineation were carried out with BSS on a blunt cannula. The lens was removed in a stop and chop  technique and the remaining cortical material was removed with the irrigation-aspiration handpiece. The capsular bag was inflated with viscoelastic and the Technis [id number]  lens was placed in the capsular bag without complication. The remaining viscoelastic was removed from the eye with the irrigation-aspiration handpiece. The wounds were hydrated. The anterior chamber was flushed with BSS and the eye was inflated to physiologic pressure. 0.86ml of Vigamox was placed in the anterior chamber. The wounds were found to be water tight. The eye was dressed with Combigan. The patient was given protective glasses to wear throughout the day and a shield with which to sleep tonight. The patient was also given drops with which to begin a drop regimen today and  will follow-up with me in one day. Implant Name Type Inv. Item Serial No. Manufacturer Lot No. LRB No. Used Action  LENS IOL TECNIS EYHANCE 19.0 - [id number] Intraocular Lens LENS IOL TECNIS EYHANCE 19.0 [id number] [employer]  Right 1 Implanted   Procedure(s): CATARACT EXTRACTION PHACO AND INTRAOCULAR LENS PLACEMENT (IOC) RIGHT  6.47  00:42.8 (Right)  Electronically signed: Galen Manila 07/24/2022 12:40 PM

## 2022-07-24 NOTE — H&P (Signed)
[facility]   Primary Care Physician:  [name], [name] Ophthalmologist: Dr. Druscilla Francis  Pre-Procedure History & Physical: HPI:  [name], [name] is a 67 y.o. male here for cataract surgery.   Past Medical History:  Diagnosis Date   ALLERGIC RHINITIS 11/25/2008   Allergy    Asthma    exercised induced and cold induced   Atypical nevus of scapular region 01/12/2017   GERD (gastroesophageal reflux disease)    occasional   Hearing loss, right 04/11/2012   Multiple surgeries in the past, partial hearing loss   Hematospermia 02/19/2018   History of brain concussion 01/18/2018   Hyperlipidemia LDL goal <130 01/10/2016   Lichen planus 11/25/2008   Qualifier: Diagnosis of  By: [name], [name]    Mitral regurgitation, myxomatous 04/11/2012   Mitral valve prolapse 04/11/2012   Severe, annual echos   S/P minimally-invasive mitral valve repair 05/21/2019   Complex valvuloplasty including triangular resection of flail P1 segment, artificial Gore-tex neochord placement x6, suture plication of P1 and P2 segments of posterior leaflet and 38 mm Sorin Memo 4D ring annuloplasty via right mini thoracotomy approach   Wears hearing aid in both ears     Past Surgical History:  Procedure Laterality Date    tendon repair quadracep Left 2013   APPENDECTOMY     Bilateral laparoscopic hernia repairs  1992   CLIPPING OF ATRIAL APPENDAGE N/A 05/21/2019   Procedure: CLIPPING OF ATRIAL APPENDAGE USING ATRICURE CLIP SIZE ;  Surgeon: [name], [name];  [name];  Service: Open Heart Surgery;  Laterality: N/A;   COLONOSCOPY  11/17/07   Dr. Marina Francis   Echo (other)     positive for MVP, markedly myxomatous, marked prolapse TVP with Regurg (03/1990)   Echo (other)  06/21/2000   mitral valve thickened, myxomatous vs degenerative, moderate MVP, moderate MR   Echo (other)  07/26/2006   LVF normal, EF 55-65%, Triv AR mod MR, marked posterior leaflet MVP   Echo (other)  06/15/2009    no change (EF 55-60% Mild Myxomatous Mitral Valve Mod MVP mod MR Mild LAE)   MITRAL VALVE REPAIR Right 05/21/2019   Procedure: MINIMALLY INVASIVE MITRAL VALVE REPAIR (MVR) USING MEMO 4D MITRAL RING SIZE ;  Surgeon: [name], [name];  Location: [facility] OR;  Service: Open Heart Surgery;  Laterality: Right;   Nasal Septoplasty (other)  2008   Dr. Kellie Francis RECONSTRUCTION     right ear   RIGHT/LEFT HEART CATH AND CORONARY ANGIOGRAPHY N/A 05/06/2019   Procedure: RIGHT/LEFT HEART CATH AND CORONARY ANGIOGRAPHY;  Surgeon: [name], [name];  Location: [facility] INVASIVE CV LAB;  Service: Cardiovascular;  Laterality: N/A;   STAPEDECTOMY     TEE WITHOUT CARDIOVERSION N/A 05/06/2019   Procedure: TRANSESOPHAGEAL ECHOCARDIOGRAM (TEE);  Surgeon: [name], [name];  Location: [facility] ENDOSCOPY;  Service: Endoscopy;  Laterality: N/A;   TEE WITHOUT CARDIOVERSION N/A 05/21/2019   Procedure: TRANSESOPHAGEAL ECHOCARDIOGRAM (TEE);  Surgeon: [name], [name];  Location: [facility];  Service: Open Heart Surgery;  Laterality: N/A;   TONSILLECTOMY     age 67    Prior to Admission medications   Medication Sig Start Date End Date Taking? Authorizing Provider  acetaminophen (TYLENOL) 500 MG tablet Take 500-1,000 mg by mouth every 6 (six) hours as needed (for pain.).   Yes Provider, Historical, [name]  albuterol (PROAIR HFA) 108 (90 Base) MCG/ACT inhaler Inhale 1-2 puffs into the lungs every 6 (six) hours as needed  for wheezing or shortness of breath. 01/22/19  Yes [name], [name]  amoxicillin (AMOXIL) 500 MG capsule Take 2,000 mg by mouth as needed (before dental appts).   Yes Provider, Historical, [name]  aspirin EC 81 MG tablet Take 81 mg by mouth every other day. Swallow whole.   Yes Provider, Historical, [name]  cetirizine (ZYRTEC) 10 MG tablet Take 10 mg by mouth every evening.   Yes Provider, Historical, [name]  fexofenadine (ALLEGRA) 180 MG tablet Take 180 mg by mouth daily as needed (allergies.).   Yes Provider,  Historical, [name]  fluticasone (FLONASE) 50 MCG/ACT nasal spray Place 2 sprays into both nostrils daily. 07/16/19  Yes [name], [name]  ibuprofen (ADVIL) 200 MG tablet Take 600-800 mg by mouth as needed.   Yes Provider, Historical, [name]  loratadine (CLARITIN) 10 MG tablet Take 10 mg by mouth every evening.   Yes Provider, Historical, [name]  montelukast (SINGULAIR) 10 MG tablet TAKE 1 TABLET BY MOUTH ONCE DAILY 08/14/21 08/20/22 Yes [name], [name]  Multiple Vitamins-Minerals (CENTRUM SILVER PO) Take by mouth at bedtime.   Yes Provider, Historical, [name]  naphazoline-pheniramine (NAPHCON-A) 0.025-0.3 % ophthalmic solution 1 drop 3 (three) times daily as needed for eye irritation.   Yes Provider, Historical, [name]  triamcinolone cream (KENALOG) 0.1 % Apply 1 application topically 2 (two) times daily as needed.   Yes Provider, Historical, [name]  valsartan (DIOVAN) 160 MG tablet Take 1 tablet (160 mg total) by mouth daily. 03/12/22  Yes [name], [name]    Allergies as of 07/04/2022   (No Known Allergies)    Family History  Problem Relation Age of Onset   Breast cancer Mother    Hypertension Mother    Anxiety disorder Mother    Diabetes Father        diet controlled, bypass fraft polyps vascular Dz.   Congestive Heart Failure Father        died in 1987 chf    Peripheral Artery Disease Father 27   Hyperparathyroidism Sister    Breast cancer Sister 44       died of tumor lysis syndrome    Ovarian cancer Sister        possible   Prostate cancer Brother        avoiding surgery   Colon cancer Neg Hx    Colon polyps Neg Hx    Esophageal cancer Neg Hx    Stomach cancer Neg Hx    Rectal cancer Neg Hx     Social History   Socioeconomic History   Marital status: Married    Spouse name: [name]   Number of children: 2   Years of education: Not on file   Highest education level: Not on file  Occupational History   Occupation: physician    Employer: [employer]    Comment: Adult nurse at [employer]  Tobacco Use   Smoking status: Never   Smokeless tobacco: Never  Vaping Use   Vaping Use: Never used  Substance and Sexual Activity   Alcohol use: Yes    Alcohol/week: 14.0 standard drinks of alcohol    Types: 14 Glasses of wine per week   Drug use: No   Sexual activity: Yes    Partners: Female  Other Topics Concern   Not on file  Social History Narrative   Physician.   Married [place], [place]   1 living child.     2 children initially.     Daughter: Morrell Riddle  1 deceased son, Onalee Hua   4 grandkids   Social Determinants of Corporate investment banker Strain: Not on [employer] Insecurity: Not on file  Transportation Needs: Not on file  Physical Activity: Not on file  Stress: Not on file  Social Connections: Not on file  Intimate Partner Violence: Not on file    Review of Systems: See HPI, otherwise negative ROS  Physical Exam: BP (!) 147/90   Pulse 74   Temp (!) 97.2 F (36.2 C) (Temporal)   Resp 14   Ht 6\' 2"  (1.88 m)   Wt 88.5 kg   SpO2 99%   BMI 25.04 kg/m  General:   Alert, cooperative in NAD Head:  Normocephalic and atraumatic. Respiratory:  Normal work of breathing. Cardiovascular:  RRR  Impression/Plan: [name], [name] is here for cataract surgery.  Risks, benefits, limitations, and alternatives regarding cataract surgery have been reviewed with the patient.  Questions have been answered.  All parties agreeable.   Galen Manila, [name]  07/24/2022, 12:11 PM

## 2022-07-25 ENCOUNTER — Encounter: Payer: Self-pay | Admitting: Ophthalmology

## 2022-08-13 ENCOUNTER — Other Ambulatory Visit: Payer: Self-pay

## 2022-08-13 ENCOUNTER — Other Ambulatory Visit: Payer: Self-pay | Admitting: Family Medicine

## 2022-08-13 MED ORDER — MONTELUKAST SODIUM 10 MG PO TABS
10.0000 mg | ORAL_TABLET | Freq: Every day | ORAL | 11 refills | Status: DC
  Filled 2022-08-13: qty 30, 30d supply, fill #0

## 2022-08-14 ENCOUNTER — Other Ambulatory Visit: Payer: Self-pay

## 2022-08-14 MED ORDER — COMIRNATY 30 MCG/0.3ML IM SUSY
0.3000 mL | PREFILLED_SYRINGE | Freq: Once | INTRAMUSCULAR | 0 refills | Status: AC
  Filled 2022-08-17: qty 0.3, 1d supply, fill #0

## 2022-08-17 ENCOUNTER — Other Ambulatory Visit: Payer: Self-pay

## 2022-08-21 ENCOUNTER — Other Ambulatory Visit: Payer: Self-pay

## 2022-08-23 ENCOUNTER — Other Ambulatory Visit: Payer: Self-pay

## 2022-08-23 ENCOUNTER — Ambulatory Visit (INDEPENDENT_AMBULATORY_CARE_PROVIDER_SITE_OTHER): Payer: 59 | Admitting: Family Medicine

## 2022-08-23 ENCOUNTER — Encounter: Payer: Self-pay | Admitting: Family Medicine

## 2022-08-23 VITALS — BP 118/70 | HR 71 | Temp 97.4°F | Ht 74.0 in | Wt 198.0 lb

## 2022-08-23 DIAGNOSIS — Z0000 Encounter for general adult medical examination without abnormal findings: Secondary | ICD-10-CM | POA: Diagnosis not present

## 2022-08-23 DIAGNOSIS — J309 Allergic rhinitis, unspecified: Secondary | ICD-10-CM

## 2022-08-23 DIAGNOSIS — I10 Essential (primary) hypertension: Secondary | ICD-10-CM

## 2022-08-23 DIAGNOSIS — R399 Unspecified symptoms and signs involving the genitourinary system: Secondary | ICD-10-CM

## 2022-08-23 DIAGNOSIS — Z7189 Other specified counseling: Secondary | ICD-10-CM

## 2022-08-23 DIAGNOSIS — Z9889 Other specified postprocedural states: Secondary | ICD-10-CM

## 2022-08-23 MED ORDER — MONTELUKAST SODIUM 10 MG PO TABS
10.0000 mg | ORAL_TABLET | Freq: Every day | ORAL | 3 refills | Status: DC
  Filled 2022-08-23 – 2022-09-06 (×2): qty 90, 90d supply, fill #0
  Filled 2022-12-05: qty 90, 90d supply, fill #1
  Filled 2023-03-06: qty 90, 90d supply, fill #2
  Filled 2023-06-04: qty 90, 90d supply, fill #3

## 2022-08-23 NOTE — Patient Instructions (Signed)
Update me as needed.  Thanks for your effort.  Take care.  Glad to see you. 

## 2022-08-23 NOTE — Progress Notes (Signed)
CPE- See plan.  Routine anticipatory guidance given to patient.  See health maintenance.  The possibility exists that previously documented standard health maintenance information may have been brought forward from a previous encounter into this note.  If needed, that same information has been updated to reflect the current situation based on today's encounter.    Tetanus 2019 Flu yearly PNA 2024 Shingrix 2020 covid vaccine prev done.  RSV d/w pt.  PSA screening d/w pt.  PSA wnl 2024 Colonoscopy 2019.  Living will d/w pt.  Wife designated if patient were incapacitated.  Then daughter designated if patient needed.   Diet and exercise d/w pt.    Still on antihistamines at baseline.  Tolerated without adverse effect on medication.  He has mild LUTS but not enough to start tx.    Hypertension:    Using medication without problems or lightheadedness: yes Chest pain with exertion:no Edema:no Short of breath: no change from baseline.  Walking 14 minute mile.   Labs d/w pt.   He had cardiology follow up.    Celiac artery findings d/w pt.  No abd pain, no intervention needed.    H/o thyroid biopsy w/o neck mass noted.   PMH and SH reviewed  Meds, vitals, and allergies reviewed.   ROS: Per HPI.  Unless specifically indicated otherwise in HPI, the patient denies:  General: fever. Eyes: acute vision changes ENT: sore throat Cardiovascular: chest pain Respiratory: SOB GI: vomiting GU: dysuria Musculoskeletal: acute back pain Derm: acute rash Neuro: acute motor dysfunction Psych: worsening mood Endocrine: polydipsia Heme: bleeding Allergy: hayfever  GEN: nad, alert and oriented HEENT: ncat NECK: supple w/o LA CV: rrr. PULM: ctab, no inc wob ABD: soft, +bs EXT: no edema SKIN: no acute rash.

## 2022-08-27 DIAGNOSIS — R399 Unspecified symptoms and signs involving the genitourinary system: Secondary | ICD-10-CM | POA: Insufficient documentation

## 2022-08-27 NOTE — Assessment & Plan Note (Signed)
Not bothersome enough to treat, he will update me as needed.

## 2022-08-27 NOTE — Assessment & Plan Note (Signed)
Controlled.  Good exercise tolerance.  Labs discussed with patient.  He will update me as needed.

## 2022-08-27 NOTE — Assessment & Plan Note (Signed)
Doing well.  No chest pain.  No lower extremity edema.  Update me as needed.  Has had cardiology follow-up.

## 2022-08-27 NOTE — Assessment & Plan Note (Signed)
Tetanus 2019 Flu yearly PNA 2024 Shingrix 2020 covid vaccine prev done.  RSV d/w pt.  PSA screening d/w pt.  PSA wnl 2024 Colonoscopy 2019.  Living will d/w pt.  Wife designated if patient were incapacitated.  Then daughter designated if patient needed.   Diet and exercise d/w pt.

## 2022-08-27 NOTE — Assessment & Plan Note (Signed)
Still on antihistamines at baseline.  Tolerated without adverse effect on medication.  Would continue as is with Zyrtec Singulair Claritin Allegra

## 2022-08-27 NOTE — Assessment & Plan Note (Signed)
Living will d/w pt.  Wife designated if patient were incapacitated.  Then daughter designated if patient needed.   ?

## 2022-09-06 ENCOUNTER — Other Ambulatory Visit: Payer: Self-pay

## 2022-10-18 ENCOUNTER — Other Ambulatory Visit: Payer: Self-pay

## 2022-10-18 ENCOUNTER — Telehealth: Payer: Self-pay | Admitting: Family Medicine

## 2022-10-18 DIAGNOSIS — J45909 Unspecified asthma, uncomplicated: Secondary | ICD-10-CM | POA: Insufficient documentation

## 2022-10-18 MED ORDER — AMOXICILLIN-POT CLAVULANATE 875-125 MG PO TABS
1.0000 | ORAL_TABLET | Freq: Two times a day (BID) | ORAL | 0 refills | Status: DC
  Filled 2022-10-18: qty 20, 10d supply, fill #0

## 2022-10-18 MED ORDER — ALBUTEROL SULFATE HFA 108 (90 BASE) MCG/ACT IN AERS
1.0000 | INHALATION_SPRAY | Freq: Four times a day (QID) | RESPIRATORY_TRACT | 2 refills | Status: AC | PRN
  Filled 2022-10-18: qty 8.5, 20d supply, fill #0
  Filled 2023-06-04: qty 8.5, 20d supply, fill #1

## 2022-10-18 NOTE — Telephone Encounter (Signed)
D/w pt about recent sx.  Prev URI improved but still with cough.  Had used SABA in the past but his med is out of date.  Rx sent.  Still speaking in complete sentences.  Isn't SOB.  He had used augmentin when he had prolonged sinus pain/ear symptoms.

## 2022-10-19 ENCOUNTER — Other Ambulatory Visit: Payer: Self-pay

## 2022-12-11 ENCOUNTER — Other Ambulatory Visit: Payer: Self-pay

## 2022-12-11 MED ORDER — INFLUENZA VAC A&B SURF ANT ADJ 0.5 ML IM SUSY
0.5000 mL | PREFILLED_SYRINGE | Freq: Once | INTRAMUSCULAR | 0 refills | Status: AC
  Filled 2022-12-11: qty 0.5, 1d supply, fill #0

## 2022-12-11 MED ORDER — COMIRNATY 30 MCG/0.3ML IM SUSY
0.3000 mL | PREFILLED_SYRINGE | Freq: Once | INTRAMUSCULAR | 0 refills | Status: AC
  Filled 2022-12-11: qty 0.3, 1d supply, fill #0

## 2022-12-17 ENCOUNTER — Other Ambulatory Visit: Payer: Self-pay

## 2022-12-19 ENCOUNTER — Telehealth: Payer: Self-pay

## 2022-12-19 DIAGNOSIS — Z9889 Other specified postprocedural states: Secondary | ICD-10-CM

## 2022-12-19 NOTE — Telephone Encounter (Signed)
Per OV note on  05/30/22 by Excell Seltzer: Recommended a repeat echocardiogram next year before his office visit.   Called and scheduled pt for 06/18/22 w/ ECHO prior. ECHO order placed at this time.

## 2023-02-05 ENCOUNTER — Other Ambulatory Visit: Payer: Self-pay

## 2023-02-05 ENCOUNTER — Other Ambulatory Visit: Payer: Self-pay | Admitting: Family Medicine

## 2023-02-06 ENCOUNTER — Other Ambulatory Visit: Payer: Self-pay

## 2023-02-06 MED FILL — Benzonatate Cap 200 MG: ORAL | 30 days supply | Qty: 90 | Fill #0 | Status: AC

## 2023-02-06 NOTE — Telephone Encounter (Signed)
Patient is asking if refill could be sent in. He is almost out of medication and would like to keep some on hand.

## 2023-02-06 NOTE — Telephone Encounter (Signed)
Sent. Thanks.   

## 2023-03-06 ENCOUNTER — Other Ambulatory Visit: Payer: Self-pay

## 2023-03-06 ENCOUNTER — Other Ambulatory Visit: Payer: Self-pay | Admitting: Family Medicine

## 2023-03-06 DIAGNOSIS — I10 Essential (primary) hypertension: Secondary | ICD-10-CM

## 2023-03-06 MED ORDER — VALSARTAN 160 MG PO TABS
160.0000 mg | ORAL_TABLET | Freq: Every day | ORAL | 3 refills | Status: AC
Start: 2023-03-06 — End: ?
  Filled 2023-03-06: qty 90, 90d supply, fill #0
  Filled 2023-06-04: qty 90, 90d supply, fill #1
  Filled 2023-09-01: qty 90, 90d supply, fill #2

## 2023-03-20 ENCOUNTER — Other Ambulatory Visit: Payer: Self-pay

## 2023-03-22 ENCOUNTER — Other Ambulatory Visit (HOSPITAL_COMMUNITY): Payer: 59

## 2023-03-25 ENCOUNTER — Other Ambulatory Visit: Payer: Self-pay

## 2023-03-25 ENCOUNTER — Telehealth: Payer: Self-pay | Admitting: Family Medicine

## 2023-03-25 MED ORDER — DOXYCYCLINE HYCLATE 100 MG PO TABS
100.0000 mg | ORAL_TABLET | Freq: Two times a day (BID) | ORAL | 1 refills | Status: DC
  Filled 2023-03-25: qty 20, 10d supply, fill #0
  Filled 2023-06-04: qty 20, 10d supply, fill #1

## 2023-03-25 NOTE — Telephone Encounter (Signed)
D/w pt about recent events.  Cough, some occ sputum. No fevers.  Some sinus pain.  Sx going on for a few days.  Not in resp distress.  Mult sick exposures at work.   Rrr with ctab, no focal dec in BS, no wheeze.  Skin well perfused.   Would hold doxy rx for now but start if more/progressive sx suggestive of a sinus infection.  He agrees with plan and can update me as needed .

## 2023-06-04 ENCOUNTER — Other Ambulatory Visit: Payer: Self-pay

## 2023-06-05 ENCOUNTER — Other Ambulatory Visit: Payer: Self-pay

## 2023-06-06 DIAGNOSIS — D2272 Melanocytic nevi of left lower limb, including hip: Secondary | ICD-10-CM | POA: Diagnosis not present

## 2023-06-06 DIAGNOSIS — D225 Melanocytic nevi of trunk: Secondary | ICD-10-CM | POA: Diagnosis not present

## 2023-06-06 DIAGNOSIS — Z872 Personal history of diseases of the skin and subcutaneous tissue: Secondary | ICD-10-CM | POA: Diagnosis not present

## 2023-06-06 DIAGNOSIS — D2271 Melanocytic nevi of right lower limb, including hip: Secondary | ICD-10-CM | POA: Diagnosis not present

## 2023-06-06 DIAGNOSIS — L821 Other seborrheic keratosis: Secondary | ICD-10-CM | POA: Diagnosis not present

## 2023-06-06 DIAGNOSIS — Z09 Encounter for follow-up examination after completed treatment for conditions other than malignant neoplasm: Secondary | ICD-10-CM | POA: Diagnosis not present

## 2023-06-06 DIAGNOSIS — D2262 Melanocytic nevi of left upper limb, including shoulder: Secondary | ICD-10-CM | POA: Diagnosis not present

## 2023-06-06 DIAGNOSIS — D2261 Melanocytic nevi of right upper limb, including shoulder: Secondary | ICD-10-CM | POA: Diagnosis not present

## 2023-06-18 ENCOUNTER — Ambulatory Visit: Payer: 59 | Admitting: Cardiovascular Disease

## 2023-06-18 ENCOUNTER — Ambulatory Visit: Payer: 59 | Attending: Cardiovascular Disease

## 2023-06-18 ENCOUNTER — Encounter: Payer: Self-pay | Admitting: Cardiovascular Disease

## 2023-06-18 VITALS — BP 120/80 | HR 71 | Ht 74.0 in | Wt 208.2 lb

## 2023-06-18 DIAGNOSIS — I10 Essential (primary) hypertension: Secondary | ICD-10-CM | POA: Insufficient documentation

## 2023-06-18 DIAGNOSIS — I361 Nonrheumatic tricuspid (valve) insufficiency: Secondary | ICD-10-CM | POA: Diagnosis not present

## 2023-06-18 DIAGNOSIS — Z9889 Other specified postprocedural states: Secondary | ICD-10-CM

## 2023-06-18 DIAGNOSIS — I341 Nonrheumatic mitral (valve) prolapse: Secondary | ICD-10-CM

## 2023-06-18 LAB — ECHOCARDIOGRAM COMPLETE
Area-P 1/2: 2.15 cm2
S' Lateral: 2.3 cm

## 2023-06-18 NOTE — Progress Notes (Unsigned)
 Cardiology Office Note:    Date:  06/20/2023   ID:  [name], [name], DOB June 14, 1955, MRN [id number]  PCP:  [name], [name]   [facility] Providers Cardiologist:  [name], [name]     Referring [name]: [name], [name]   Chief Complaint  Patient presents with   Follow-up    S/P mitral valve repair    History of Present Illness:    [name], [name] is a 68 y.o. male with a hx of mitral valve prolapse with severe mitral regurgitation status post minimally invasive mitral valve repair in 2021.  The patient was treated with a complex repair including placement of a 38 mm Sorin annuloplasty ring via a right minithoracotomy approach.  His postoperative echo showed no residual mitral regurgitation and a transmitral mean gradient of 6 mmHg.   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  He has been doing well.  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  He continues to exercise with mild limitation when he exercises more vigorously such as hiking up a steep hill.  He otherwise is asymptomatic with no chest pain, chest pressure, heart palpitations, orthopnea, or PND.  He reports good blood pressure control on valsartan.  He has no complaints today.   Current Medications: Current Meds  Medication Sig   acetaminophen (TYLENOL) 500 MG tablet Take 500-1,000 mg by mouth every 6 (six) hours as needed (for pain.).   albuterol (PROAIR HFA) 108 (90 Base) MCG/ACT inhaler Inhale 1-2 puffs into the lungs every 6 (six) hours as needed for wheezing or shortness of breath.   amoxicillin (AMOXIL) 500 MG capsule Take 2,000 mg by mouth as needed (before dental appts).   aspirin EC 81 MG tablet Take 81 mg by mouth every other day. Swallow whole.   benzonatate (TESSALON) 200 MG capsule Take 1 capsule (200 mg total) by mouth 3 (three) times daily as needed.   cetirizine (ZYRTEC) 10 MG tablet Take 10 mg by mouth every evening.   fexofenadine (ALLEGRA) 180 MG  tablet Take 180 mg by mouth daily as needed (allergies.).   fluticasone (FLONASE) 50 MCG/ACT nasal spray Place 2 sprays into both nostrils daily.   ibuprofen (ADVIL) 200 MG tablet Take 600-800 mg by mouth as needed.   loratadine (CLARITIN) 10 MG tablet Take 10 mg by mouth every evening.   montelukast (SINGULAIR) 10 MG tablet Take 1 tablet (10 mg total) by mouth daily.   Multiple Vitamins-Minerals (CENTRUM SILVER PO) Take by mouth at bedtime.   triamcinolone cream (KENALOG) 0.1 % Apply 1 application topically 2 (two) times daily as needed.   valsartan (DIOVAN) 160 MG tablet Take 1 tablet (160 mg total) by mouth daily.     Allergies:   Patient has no known allergies.   ROS:   Please see the history of present illness.    All other systems reviewed and are negative.  EKGs/Labs/Other Studies Reviewed:    The following studies were reviewed today: Cardiac Studies & Procedures   ______________________________________________________________________________________________ CARDIAC CATHETERIZATION  CARDIAC CATHETERIZATION 05/06/2019  Narrative  Prox Cx to Mid Cx lesion is 20% stenosed.  1. Mild nonobstructive CAD with mild stenosis of the mid-LAD and mid-LCx, no more than 30% vessel narrowing in either vessel 2. Normal right heart pressures, normal LVEDP/PCWP 3. Known severe mitral regurgitation by echo assessment, with well-compensated hemodynamics  Plan: cardiac surgical evaluation for mitral valve repair  Findings Coronary Findings Diagnostic  Dominance: Right  Left Main The vessel exhibits minimal luminal irregularities.  Left Anterior Descending Mid LAD lesion is 25% stenosed.  Left Circumflex There is mild focal disease in the vessel. Prox Cx to Mid Cx lesion is 20% stenosed.  Right Coronary Artery The vessel exhibits minimal luminal irregularities.  Intervention  No interventions have been documented.     ECHOCARDIOGRAM  ECHOCARDIOGRAM COMPLETE  06/18/2023  Narrative ECHOCARDIOGRAM REPORT    Patient Name:   [name] Date of Exam: 06/18/2023 Medical Rec #:  [id number]        Height:       74.0 in Accession #:    [id number]       Weight:       198.0 lb Date of Birth:  Feb 24, 1956       BSA:          2.164 m Patient Age:    67 years         BP:           118/70 mmHg Patient Gender: M                HR:           72 bpm. Exam Location:  [address]  Procedure: 2D Echo, 3D Echo, Cardiac Doppler, Color Doppler and Strain Analysis (Both Spectral and Color Flow Doppler were utilized during procedure).  Indications:    Z98.890 MV repair  History:        Patient has prior history of Echocardiogram examinations, most recent 05/10/2021. S/p MV repair (38mm Sorin Memo ring 05/21/19); Risk Factors:Family History of Coronary Artery Disease, Hypertension and Dyslipidemia.  Sonographer:    [name] RDCS Referring Phys: [phone] [name]  IMPRESSIONS   1. There is insufficient color Doppler imaging of the mitral valve. Based on parasternal color Doppler and the CW Doppler signal, there appears to be mild mitral insufficiency. Color Doppler was not performed on the mitral valve in apical 4-chamber or 2-chamber views and the apical 3-chamber color imaging is angled to evaluate the outflow tract. 2. Left ventricular ejection fraction, by estimation, is 65 to 70%. Left ventricular ejection fraction by 3D volume is 62 %. The left ventricle has normal function. The left ventricle has no regional wall motion abnormalities. Left ventricular diastolic function could not be evaluated. The average left ventricular global longitudinal strain is -18.7 %. The global longitudinal strain is normal. 3. Right ventricular systolic function is normal. The right ventricular size is normal. There is normal pulmonary artery systolic pressure. The estimated right ventricular systolic pressure is 32.8 mmHg. 4. Left atrial size was moderately  dilated. 5. The mitral valve has been repaired/replaced. Mild mitral valve regurgitation. No evidence of mitral stenosis. The mean mitral valve gradient is 5.1 mmHg with average heart rate of 66 bpm. 6. The aortic valve is tricuspid. Aortic valve regurgitation is mild. 7. The inferior vena cava is normal in size with greater than 50% respiratory variability, suggesting right atrial pressure of 3 mmHg.  Comparison(s): No significant change from prior study. Prior images reviewed side by side.  FINDINGS Left Ventricle: Left ventricular ejection fraction, by estimation, is 65 to 70%. Left ventricular ejection fraction by 3D volume is 62 %. The left ventricle has normal function. The left ventricle has no regional wall motion abnormalities. The average left ventricular global longitudinal strain is -18.7 %. Strain was performed and the global longitudinal strain is normal. The left ventricular internal cavity size was normal in  size. There is no left ventricular hypertrophy. Abnormal (paradoxical) septal motion consistent with post-operative status. Left ventricular diastolic function could not be evaluated due to mitral valve repair. Left ventricular diastolic function could not be evaluated.  Right Ventricle: The right ventricular size is normal. No increase in right ventricular wall thickness. Right ventricular systolic function is normal. There is normal pulmonary artery systolic pressure. The tricuspid regurgitant velocity is 2.73 m/s, and with an assumed right atrial pressure of 3 mmHg, the estimated right ventricular systolic pressure is 32.8 mmHg.  Left Atrium: Left atrial size was moderately dilated.  Right Atrium: Right atrial size was normal in size.  Pericardium: There is no evidence of pericardial effusion.  Mitral Valve: The mitral valve has been repaired/replaced. Mild mitral valve regurgitation. There is a prosthetic annuloplasty ring present in the mitral position. No evidence of  mitral valve stenosis. The mean mitral valve gradient is 5.1 mmHg with average heart rate of 66 bpm.  Tricuspid Valve: The tricuspid valve is normal in structure. Tricuspid valve regurgitation is mild.  Aortic Valve: The aortic valve is tricuspid. Aortic valve regurgitation is mild.  Pulmonic Valve: The pulmonic valve was grossly normal. Pulmonic valve regurgitation is not visualized. No evidence of pulmonic stenosis.  Aorta: The aortic root and ascending aorta are structurally normal, with no evidence of dilitation.  Venous: The inferior vena cava is normal in size with greater than 50% respiratory variability, suggesting right atrial pressure of 3 mmHg.  IAS/Shunts: No atrial level shunt detected by color flow Doppler.  Additional Comments: 3D was performed not requiring image post processing on an independent workstation and was normal.   LEFT VENTRICLE PLAX 2D LVIDd:         4.10 cm         Diastology LVIDs:         2.30 cm         LV e' medial:    8.16 cm/s LV PW:         1.20 cm         LV E/e' medial:  17.5 LV IVS:        1.10 cm         LV e' lateral:   11.40 cm/s LVOT diam:     2.20 cm         LV E/e' lateral: 12.5 LV SV:         114 LV SV Index:   53              2D Longitudinal LVOT Area:     3.80 cm        Strain 2D Strain GLS   -18.3 % (A4C): 2D Strain GLS   -18.5 % (A3C): 2D Strain GLS   -19.4 % (A2C): 2D Strain GLS   -18.7 % Avg:  3D Volume EF LV 3D EF:    Left ventricul ar ejection fraction by 3D volume is 62 %.  3D Volume EF: 3D EF:        62 % LV EDV:       136 ml LV ESV:       52 ml LV SV:        85 ml  RIGHT VENTRICLE             IVC RV S prime:     15.30 cm/s  IVC diam: 1.00 cm TAPSE (M-mode): 2.2 cm RVSP:           32.8 mmHg  LEFT ATRIUM             Index        RIGHT ATRIUM           Index LA diam:        4.30 cm 1.99 cm/m   RA Pressure: 3.00 mmHg LA Vol (A2C):   88.3 ml 40.80 ml/m  RA Area:     17.60 cm LA Vol (A4C):   82.7 ml  38.21 ml/m  RA Volume:   55.30 ml  25.55 ml/m LA Biplane Vol: 91.1 ml 42.09 ml/m AORTIC VALVE LVOT Vmax:   133.00 cm/s LVOT Vmean:  98.900 cm/s LVOT VTI:    0.299 m  AORTA Ao Root diam: 3.50 cm Ao Asc diam:  3.45 cm  MITRAL VALVE                TRICUSPID VALVE MV Area (PHT): 2.15 cm     TR Peak grad:   29.8 mmHg MV Mean grad:  5.1 mmHg     TR Vmax:        273.00 cm/s MV Decel Time: 353 msec     Estimated RAP:  3.00 mmHg MV E velocity: 143.00 cm/s  RVSP:           32.8 mmHg MV A velocity: 101.00 cm/s MV E/A ratio:  1.42         SHUNTS Systemic VTI:  0.30 m Systemic Diam: 2.20 cm  Rachelle Hora Croitoru [name] Electronically signed by Thurmon Fair [name] Signature Date/Time: 06/18/2023/3:22:41 PM    Final   TEE  ECHO INTRAOPERATIVE TEE 05/21/2019  Narrative *INTRAOPERATIVE TRANSESOPHAGEAL REPORT *    Patient Name:   [name] Date of Exam: 05/21/2019 Medical Rec #:  [id number]        Height:       74.0 in Accession #:    [id number]       Weight:       200.0 lb Date of Birth:  19-Jun-1955       BSA:          2.17 m Patient Age:    63 years         BP:           130/82 mmHg Patient Gender: M                HR:           63 bpm. Exam Location:  Inpatient  Transesophogeal exam was perform intraoperatively during surgical procedure. Patient was closely monitored under general anesthesia during the entirety of examination.  Indications:     mitral valve prolapse Performing Phys: [id number] CLARENCE H OWEN Diagnosing Phys: Achille Rich [name]  Complications: No known complications during this procedure. POST-OP IMPRESSIONS - Left Ventricle: The left ventricle is unchanged from pre-bypass. - Aorta: The aorta appears unchanged from pre-bypass. - Aortic Valve: The aortic valve appears unchanged from pre-bypass. - Mitral Valve: No stenosis present. There is no regurgitation. No regurgitation post repair. No perivalvular leak noted.MV ring in place. There is no evidence of SAM at  completion of procedure. - Tricuspid Valve: The tricuspid valve appears unchanged from pre-bypass. - Interatrial Septum: The interatrial septum appears unchanged from pre-bypass. - Pericardium: The pericardium appears unchanged from pre-bypass. - Comments: LA appendage was closed and is noted to be absent after procedure.  PRE-OP FINDINGS Left Ventricle: The left ventricle has normal systolic function, with an ejection fraction of 60-65%. The cavity size was mildly  dilated. There is mildly increased left ventricular wall thickness. No evidence of left ventricular regional wall motion abnormalities.  Right Ventricle: The right ventricle has mildly reduced systolic function. The cavity was mildly enlarged. There is no increase in right ventricular wall thickness.  Left Atrium: Left atrial size was dilated.  Right Atrium: Right atrial size was normal in size. Right atrial pressure is estimated at 10 mmHg.  Interatrial Septum: No atrial level shunt detected by color flow Doppler.  Pericardium: There is no evidence of pericardial effusion.  Mitral Valve: The mitral valve is myxomatous. Severe thickening of the mitral valve leaflet. No calcification of the mitral valve leaflet. Mitral valve regurgitation is severe by color flow Doppler. The MR jet is anteriorly-directed. There is no evidence of mitral valve vegetation. There is severe holosystolic prolapse of of the mitral valve. Severe prolapse of P3 segment. Eccentric regurgitant jet which is directed anteriorly and tracks along the LA wall.  Tricuspid Valve: The tricuspid valve was normal in structure. Tricuspid valve regurgitation is mild by color flow Doppler.  Aortic Valve: The aortic valve is normal in structure. Aortic valve regurgitation is mild by color flow Doppler. There is no evidence of aortic valve stenosis. There is no evidence of a vegetation on the aortic valve.  Pulmonic Valve: The pulmonic valve was normal in  structure. Pulmonic valve regurgitation is not visualized by color flow Doppler.   Aorta: The aortic root, ascending aorta and aortic arch are normal in size and structure.  +------------+-----------++ AORTIC VALVE            +------------+-----------++ LVOT Vmax:  101.00 cm/s +------------+-----------++ LVOT Vmean: 69.000 cm/s +------------+-----------++ LVOT VTI:   0.182 m     +------------+-----------++   Achille Rich [name] Electronically signed by Achille Rich [name] Signature Date/Time: 05/22/2019/10:13:01 AM    Final        ______________________________________________________________________________________________      EKG:   EKG Interpretation Date/Time:  Tuesday June 18 2023 14:37:31 EDT Ventricular Rate:  71 PR Interval:  208 QRS Duration:  90 QT Interval:  386 QTC Calculation: 419 R Axis:   93  Text Interpretation: Normal sinus rhythm Rightward axis When compared with ECG of 22-May-2019 06:39, Sinus rhythm has replaced Electronic ventricular pacemaker Confirmed by [name] [phone]) on 06/18/2023 2:50:31 PM    Recent Labs: No results found for requested labs within last 365 days.  Recent Lipid Panel    Component Value Date/Time   CHOL 219 (A) 06/19/2022 0000   TRIG 131 06/19/2022 0000   HDL 79 (A) 06/19/2022 0000   CHOLHDL 3.1 12/24/2018 1241   VLDL 22.6 04/10/2012 1250   LDLCALC 117 06/19/2022 0000   LDLCALC 130 (H) 12/24/2018 1241   LDLDIRECT 130.1 04/10/2012 1250            Physical Exam:    VS:  BP 120/80   Pulse 71   Ht 6\' 2"  (1.88 m)   Wt 208 lb 3.2 oz (94.4 kg)   SpO2 97%   BMI 26.73 kg/m     Wt Readings from Last 3 Encounters:  06/18/23 208 lb 3.2 oz (94.4 kg)  08/23/22 198 lb (89.8 kg)  07/18/22 195 lb (88.5 kg)     GEN:  Well nourished, well developed in no acute distress HEENT: Normal NECK: No JVD; No carotid bruits LYMPHATICS: No lymphadenopathy CARDIAC: RRR, no murmurs, rubs,  gallops RESPIRATORY:  Clear to auscultation without rales, wheezing or rhonchi  ABDOMEN: Soft, non-tender, non-distended MUSCULOSKELETAL:  No  edema; No deformity  SKIN: Warm and dry NEUROLOGIC:  Alert and oriented x 3 PSYCHIATRIC:  Normal affect   Assessment & Plan Hypertension, unspecified type Blood pressure is well-controlled.  Patient follows a good exercise regimen.  Continue valsartan 160 mg daily. S/P MVR (mitral valve repair) The patient is doing well with NYHA functional class II symptoms (mild exercise limitation).  He may have mild limitation with rapid heart rates due to functional mitral stenosis following mitral valve repair.  I personally reviewed today's echo images with the formal interpretation currently pending.  His echo demonstrates vigorous LVEF and no regional wall motion abnormalities.  He has transmitral flow appears normal and I do not see a measurement for a mean gradient.  There is trivial mitral regurgitation present.  He continues to follow SBE prophylaxis per guidelines.  I will see him back in 1 year for clinical follow-up and we will plan on a repeat echo in 2 years.            Medication Adjustments/Labs and Tests Ordered: Current medicines are reviewed at length with the patient today.  Concerns regarding medicines are outlined above.  Orders Placed This Encounter  Procedures   EKG 12-Lead   No orders of the defined types were placed in this encounter.   Patient Instructions  Follow-Up: At [facility], you and your health needs are our priority.  As part of our continuing mission to provide you with exceptional heart care, we have created designated Provider Care Teams.  These Care Teams include your primary Cardiologist (physician) and Advanced Practice Providers (APPs -  Physician Assistants and Nurse Practitioners) who all work together to provide you with the care you need, when you need it.  Your next appointment:   1  year(s)  Provider:   Tonny Francis, [name]      1st Floor: - Lobby - Registration  - Pharmacy  - Lab - Cafe  2nd Floor: - PV Lab - Diagnostic Testing (echo, CT, nuclear med)  3rd Floor: - Vacant  4th Floor: - TCTS (cardiothoracic surgery) - AFib Clinic - Structural Heart Clinic - Vascular Surgery  - Vascular Ultrasound  5th Floor: - HeartCare Cardiology (general and EP) - Clinical Pharmacy for coumadin, hypertension, lipid, weight-loss medications, and med management appointments    Valet parking services will be available as well.     Signed, [name], [name]  06/20/2023 7:48 AM    [facility]

## 2023-06-18 NOTE — Assessment & Plan Note (Signed)
 Blood pressure is well-controlled.  Patient follows a good exercise regimen.  Continue valsartan 160 mg daily.

## 2023-06-18 NOTE — Patient Instructions (Signed)
 Follow-Up: At [facility], you and your health needs are our priority.  As part of our continuing mission to provide you with exceptional heart care, we have created designated Provider Care Teams.  These Care Teams include your primary Cardiologist (physician) and Advanced Practice Providers (APPs -  Physician Assistants and Nurse Practitioners) who all work together to provide you with the care you need, when you need it.  Your next appointment:   1 year(s)  Provider:   Tonny Bollman, MD        1st Floor: - Lobby - Registration  - Pharmacy  - Lab - Cafe  2nd Floor: - PV Lab - Diagnostic Testing (echo, CT, nuclear med)  3rd Floor: - Vacant  4th Floor: - TCTS (cardiothoracic surgery) - AFib Clinic - Structural Heart Clinic - Vascular Surgery  - Vascular Ultrasound  5th Floor: - HeartCare Cardiology (general and EP) - Clinical Pharmacy for coumadin, hypertension, lipid, weight-loss medications, and med management appointments    Valet parking services will be available as well.

## 2023-07-02 LAB — BASIC METABOLIC PANEL WITH GFR
A1c: 5.4
Creatinine: 0.8 (ref 0.6–1.3)
EGFR (Non-African Amer.): 100
Glucose: 83
PSA, Total: 0.7
Potassium: 4.4 meq/L (ref 3.5–5.1)
TSH: 2.58 (ref 0.41–5.90)

## 2023-07-02 LAB — PSA: PSA: 0.7

## 2023-07-02 LAB — LIPID PANEL
Cholesterol: 234 — AB (ref 0–200)
HDL: 85 — AB (ref 35–70)
LDL Cholesterol: 126
Triglycerides: 131 (ref 40–160)

## 2023-07-02 LAB — TSH: TSH: 2.58 (ref 0.41–5.90)

## 2023-07-02 LAB — HEPATIC FUNCTION PANEL
ALT: 41 U/L — AB (ref 10–40)
AST: 40 (ref 14–40)

## 2023-07-02 LAB — CBC AND DIFFERENTIAL
Hemoglobin: 15.3 (ref 13.5–17.5)
Platelets: 261 10*3/uL (ref 150–400)
WBC: 6.9

## 2023-07-02 LAB — HEMOGLOBIN A1C: Hemoglobin A1C: 5.4

## 2023-07-15 ENCOUNTER — Encounter: Payer: Self-pay | Admitting: Family Medicine

## 2023-08-27 ENCOUNTER — Encounter: Admitting: Family Medicine

## 2023-08-29 ENCOUNTER — Other Ambulatory Visit: Payer: Self-pay | Admitting: Family Medicine

## 2023-08-29 ENCOUNTER — Other Ambulatory Visit: Payer: Self-pay

## 2023-08-29 MED ORDER — MECLIZINE HCL 25 MG PO CHEW
12.5000 mg | CHEWABLE_TABLET | Freq: Three times a day (TID) | ORAL | 1 refills | Status: DC | PRN
  Filled 2023-08-29: qty 100, 34d supply, fill #0

## 2023-08-29 NOTE — Progress Notes (Signed)
 Pt needed meclizine  rx for upcoming cruise, rx sent.  D/w pt.

## 2023-09-01 ENCOUNTER — Other Ambulatory Visit: Payer: Self-pay | Admitting: Family Medicine

## 2023-09-02 ENCOUNTER — Other Ambulatory Visit: Payer: Self-pay

## 2023-09-02 MED ORDER — MONTELUKAST SODIUM 10 MG PO TABS
10.0000 mg | ORAL_TABLET | Freq: Every day | ORAL | 3 refills | Status: AC
  Filled 2023-09-02: qty 90, 90d supply, fill #0
  Filled 2023-11-28: qty 90, 90d supply, fill #1
  Filled 2024-02-25: qty 90, 90d supply, fill #2

## 2023-09-17 ENCOUNTER — Other Ambulatory Visit: Payer: Self-pay

## 2023-09-17 ENCOUNTER — Ambulatory Visit (INDEPENDENT_AMBULATORY_CARE_PROVIDER_SITE_OTHER): Admitting: Family Medicine

## 2023-09-17 VITALS — BP 128/70 | HR 85 | Temp 98.1°F | Ht 72.4 in | Wt 209.0 lb

## 2023-09-17 DIAGNOSIS — Z0000 Encounter for general adult medical examination without abnormal findings: Secondary | ICD-10-CM

## 2023-09-17 DIAGNOSIS — I10 Essential (primary) hypertension: Secondary | ICD-10-CM

## 2023-09-17 DIAGNOSIS — J309 Allergic rhinitis, unspecified: Secondary | ICD-10-CM

## 2023-09-17 DIAGNOSIS — I708 Atherosclerosis of other arteries: Secondary | ICD-10-CM

## 2023-09-17 DIAGNOSIS — R059 Cough, unspecified: Secondary | ICD-10-CM

## 2023-09-17 DIAGNOSIS — E041 Nontoxic single thyroid nodule: Secondary | ICD-10-CM

## 2023-09-17 DIAGNOSIS — Z7189 Other specified counseling: Secondary | ICD-10-CM

## 2023-09-17 MED ORDER — VALSARTAN 160 MG PO TABS
160.0000 mg | ORAL_TABLET | Freq: Every day | ORAL | 3 refills | Status: AC
Start: 2023-09-17 — End: ?
  Filled 2023-09-17 – 2023-11-28 (×2): qty 90, 90d supply, fill #0
  Filled 2024-02-25: qty 90, 90d supply, fill #1

## 2023-09-17 MED ORDER — TRIAMCINOLONE ACETONIDE 0.1 % EX CREA
1.0000 | TOPICAL_CREAM | Freq: Two times a day (BID) | CUTANEOUS | 1 refills | Status: AC | PRN
  Filled 2023-09-17: qty 30, 15d supply, fill #0

## 2023-09-17 MED ORDER — TRAMADOL HCL 50 MG PO TABS
50.0000 mg | ORAL_TABLET | Freq: Four times a day (QID) | ORAL | 0 refills | Status: DC | PRN
  Filled 2023-09-17: qty 20, 5d supply, fill #0

## 2023-09-17 MED ORDER — AMOXICILLIN-POT CLAVULANATE 875-125 MG PO TABS
1.0000 | ORAL_TABLET | Freq: Two times a day (BID) | ORAL | 1 refills | Status: DC
  Filled 2023-09-17: qty 20, 10d supply, fill #0

## 2023-09-17 NOTE — Patient Instructions (Signed)
 I would get a flu shot each fall.   Take care.  Glad to see you.  Update me as needed.

## 2023-09-17 NOTE — Progress Notes (Unsigned)
 CPE- See plan.  Routine anticipatory guidance given to patient.  See health maintenance.  The possibility exists that previously documented standard health maintenance information may have been brought forward from a previous encounter into this note.  If needed, that same information has been updated to reflect the current situation based on today's encounter.    Tetanus 2019 Flu yearly PNA 2024 Shingrix  2020 covid vaccine prev done.  RSV d/w pt.  PSA screening d/w pt.  PSA wnl 2024 Colonoscopy 2019.  Living will d/w pt.  Wife designated if patient were incapacitated.  Then daughter designated if patient needed.   Diet and exercise d/w pt.    I sent rx for augmentin  given his hx.  D/w pt.    D/w pt about tramadol  use.  He had used rarely for cough.    He has raynaud's phenomena now, since his prior surgery.      Still on antihistamines at baseline.  Tolerated without adverse effect on medication.  Used with effect.  He prev did allergy shots w/o relief.  Rare albuterol  use.     He has mild LUTS but not enough to start tx.  Slower stream but manageable.    D/w pt about amoxil  prior to dental visits.     Hypertension:               Using medication without problems or lightheadedness: yes Chest pain with exertion:no Edema:no Short of breath: no change from baseline.  Walking 14 minute mile during cool weather.   Labs d/w pt.   He had cardiology follow up.     Celiac artery findings d/w pt.  No abd pain, no intervention needed.     H/o thyroid biopsy w/o neck mass noted.  No dysphagia.  D/w pt about options, ie possible consideration of f/u u/s.  He wanted to defer.  He doesn't have alarming or any symptoms.    PMH and SH reviewed  Meds, vitals, and allergies reviewed.   ROS: Per HPI.  Unless specifically indicated otherwise in HPI, the patient denies:  General: fever. Eyes: acute vision changes ENT: sore throat Cardiovascular: chest pain Respiratory: SOB GI:  vomiting GU: dysuria Musculoskeletal: acute back pain Derm: acute rash Neuro: acute motor dysfunction Psych: worsening mood Endocrine: polydipsia Heme: bleeding Allergy: hayfever  GEN: nad, alert and oriented HEENT: mucous membranes moist NECK: supple w/o LA CV: rrr. PULM: ctab, no inc wob ABD: soft, +bs EXT: no edema SKIN: no acute rash

## 2023-09-19 DIAGNOSIS — R059 Cough, unspecified: Secondary | ICD-10-CM | POA: Insufficient documentation

## 2023-09-19 NOTE — Assessment & Plan Note (Addendum)
 Celiac artery findings d/w pt.  No abd pain, no intervention needed.   This does not appear to be clinically significant.  Discussed.

## 2023-09-19 NOTE — Assessment & Plan Note (Signed)
 D/w pt about options, ie possible consideration of f/u u/s.  He wanted to defer.  He doesn't have alarming or any symptoms.  No thyromegaly noted on exam.  He can update me as needed.

## 2023-09-19 NOTE — Assessment & Plan Note (Signed)
Tetanus 2019 Flu yearly PNA 2024 Shingrix 2020 covid vaccine prev done.  RSV d/w pt.  PSA screening d/w pt.  PSA wnl 2024 Colonoscopy 2019.  Living will d/w pt.  Wife designated if patient were incapacitated.  Then daughter designated if patient needed.   Diet and exercise d/w pt.

## 2023-09-19 NOTE — Assessment & Plan Note (Signed)
Living will d/w pt.  Wife designated if patient were incapacitated.  Then daughter designated if patient needed.   ?

## 2023-09-19 NOTE — Assessment & Plan Note (Signed)
 D/w pt about tramadol  use.  He had used rarely for cough.   I sent prescription for him to hold.  I sent rx for augmentin  given his hx of sinus infections, he is going to hold that prescription.  D/w pt.

## 2023-09-19 NOTE — Assessment & Plan Note (Signed)
 Continue antihistamines as is.  Rare albuterol  use.

## 2023-11-28 ENCOUNTER — Other Ambulatory Visit: Payer: Self-pay

## 2024-01-02 ENCOUNTER — Other Ambulatory Visit: Payer: Self-pay

## 2024-01-02 MED ORDER — FLUZONE HIGH-DOSE 0.5 ML IM SUSY
0.5000 mL | PREFILLED_SYRINGE | Freq: Once | INTRAMUSCULAR | 0 refills | Status: AC
  Filled 2024-01-02: qty 0.5, 1d supply, fill #0

## 2024-01-02 MED ORDER — COMIRNATY 30 MCG/0.3ML IM SUSY
0.3000 mL | PREFILLED_SYRINGE | Freq: Once | INTRAMUSCULAR | 0 refills | Status: AC
  Filled 2024-01-02: qty 0.3, 1d supply, fill #0

## 2024-04-20 ENCOUNTER — Ambulatory Visit: Payer: Self-pay

## 2024-04-20 ENCOUNTER — Ambulatory Visit

## 2024-04-20 VITALS — BP 130/70 | HR 84 | Temp 97.8°F | Wt 202.0 lb

## 2024-04-20 DIAGNOSIS — E7849 Other hyperlipidemia: Secondary | ICD-10-CM | POA: Diagnosis not present

## 2024-04-20 DIAGNOSIS — Z9889 Other specified postprocedural states: Secondary | ICD-10-CM | POA: Diagnosis not present

## 2024-04-20 DIAGNOSIS — R55 Syncope and collapse: Secondary | ICD-10-CM

## 2024-04-20 LAB — COMPREHENSIVE METABOLIC PANEL WITH GFR
ALT: 29 U/L (ref 3–53)
AST: 29 U/L (ref 5–37)
Albumin: 4.5 g/dL (ref 3.5–5.2)
Alkaline Phosphatase: 50 U/L (ref 39–117)
BUN: 17 mg/dL (ref 6–23)
CO2: 25 meq/L (ref 19–32)
Calcium: 9.9 mg/dL (ref 8.4–10.5)
Chloride: 105 meq/L (ref 96–112)
Creatinine, Ser: 0.8 mg/dL (ref 0.40–1.50)
GFR: 91.87 mL/min
Glucose, Bld: 90 mg/dL (ref 70–99)
Potassium: 4.8 meq/L (ref 3.5–5.1)
Sodium: 138 meq/L (ref 135–145)
Total Bilirubin: 0.6 mg/dL (ref 0.2–1.2)
Total Protein: 6.8 g/dL (ref 6.0–8.3)

## 2024-04-20 LAB — CBC WITH DIFFERENTIAL/PLATELET
Basophils Absolute: 0 K/uL (ref 0.0–0.1)
Basophils Relative: 0.3 % (ref 0.0–3.0)
Eosinophils Absolute: 0.1 K/uL (ref 0.0–0.7)
Eosinophils Relative: 0.7 % (ref 0.0–5.0)
HCT: 42.2 % (ref 39.0–52.0)
Hemoglobin: 14.1 g/dL (ref 13.0–17.0)
Lymphocytes Relative: 21.9 % (ref 12.0–46.0)
Lymphs Abs: 1.7 K/uL (ref 0.7–4.0)
MCHC: 33.3 g/dL (ref 30.0–36.0)
MCV: 91.1 fl (ref 78.0–100.0)
Monocytes Absolute: 0.7 K/uL (ref 0.1–1.0)
Monocytes Relative: 8.6 % (ref 3.0–12.0)
Neutro Abs: 5.4 K/uL (ref 1.4–7.7)
Neutrophils Relative %: 68.5 % (ref 43.0–77.0)
Platelets: 250 K/uL (ref 150.0–400.0)
RBC: 4.63 Mil/uL (ref 4.22–5.81)
RDW: 13.8 % (ref 11.5–15.5)
WBC: 7.8 K/uL (ref 4.0–10.5)

## 2024-04-20 LAB — LIPID PANEL
Cholesterol: 230 mg/dL — ABNORMAL HIGH (ref 28–200)
HDL: 71 mg/dL
LDL Cholesterol: 123 mg/dL — ABNORMAL HIGH (ref 10–99)
NonHDL: 158.61
Total CHOL/HDL Ratio: 3
Triglycerides: 178 mg/dL — ABNORMAL HIGH (ref 10.0–149.0)
VLDL: 35.6 mg/dL (ref 0.0–40.0)

## 2024-04-20 LAB — TSH: TSH: 2.42 u[IU]/mL (ref 0.35–5.50)

## 2024-04-20 NOTE — Telephone Encounter (Signed)
 See OV note from today.  Thanks.

## 2024-04-20 NOTE — Progress Notes (Unsigned)
 "  Subjective:   This visit was conducted in person. The patient gave informed consent to the use of Abridge AI technology to record the contents of the encounter as documented below.   Patient ID: Ryan LILLETTE Denise, MD, male    DOB: Sep 26, 1955, 69 y.o.   MRN: [id number]   Discussed the use of AI scribe software for clinical note transcription with the patient, who gave verbal consent to proceed.  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  He experienced a syncopal episode on the evening of April 19, 2024, after donating blood earlier that day at 11 AM. He passed out around 8 PM while getting up from a chair after watching a football game. No dizziness or symptoms were noted prior to the episode. During the fall, he dislocated his finger, which he reduced himself. Post-fall, he experienced profuse sweating and confusion, and his wife noted he was briefly apneic. Emergency services were called, and by the time they arrived, he was fully awake. No chest pain, shortness of breath, or dizziness occurred after the initial episode.  He has a history of mitral valve repair and has experienced Raynaud's phenomenon since the surgery. His fingers are often cold and difficult to warm up. He has a history of functional mitral stenosis. No palpitations or cardiac symptoms were noted leading up to the syncopal episode.  He has donated blood approximately 60 times over the past 25 years, but this was only the third time since his heart surgery. He noted unusual diuresis after the blood donation despite adequate hydration. His transcutaneous hemoglobin was 13.5 g/dL before the donation, which is lower than his usual 15 g/dL. He did not take his blood pressure medication the night after the episode.  He has a past medical history of a syncopal episode during medical school, which occurred  while standing for a prolonged period. He takes his blood pressure medication at night and did not take it after the syncopal episode. No similar episodes have occurred in the past three months.      Review of Systems      Allergies[1]  Medications Ordered Prior to Encounter[2]  BP 130/70 (BP Location: Left Arm, Patient Position: Sitting, Cuff Size: Large)   Pulse 84   Temp 97.8 F (36.6 C) (Oral)   Wt 202 lb (91.6 kg)   BMI 27.09 kg/m   Objective:      Physical Exam VITALS: P- 70, BP- 100/60 GENERAL: Alert, cooperative, well developed, no acute distress. HEAD: Normocephalic atraumatic. EYES: Extraocular movements intact BL, pupils round, equal and reactive to light BL, conjunctivae normal BL. EARS: Tympanic membrane, ear canal and external ear normal BL. NOSE: No congestion or rhinorrhea, mucous membranes are moist. THROAT: No oropharyngeal exudate or posterior oropharyngeal erythema. CARDIOVASCULAR: Mitral regurgitation murmur present. Regular heart rhythm, no additional murmurs. CHEST: Clear to auscultation bilaterally, no wheezes, rhonchi, or crackles. ABDOMEN: Soft, non tender, non distended, without organomegaly, normal bowel sounds. EXTREMITIES: No cyanosis or edema. NEUROLOGICAL: Oriented to person, place and time, no gait abnormalities, moves all extremities without gross motor or sensory deficit.         Assessment & Plan:   Assessment and Plan Assessment & Plan Syncope following blood donation Syncopal episode likely due to orthostatic hypotension from decreased blood volume. EKGs normal, no arrhythmia or cardiac symptoms. - Performed orthostatic vitals. - Ordered  CBC and CMP. - Rechecked thyroid  function. - Consider Zio monitor if symptoms recur.  Mitral valve disease, post-repair Mitral valve repair with residual regurgitation. No new cardiac symptoms. - Continue regular follow-up with cardiologist.  Raynaud's phenomenon Cold fingers and  difficulty warming them since heart surgery. Capillary refill good. - Monitor symptoms and manage conservatively.  Hyperlipidemia Request to check cholesterol levels. Proceeded with non-fasting test. - Ordered cholesterol test. Donated blood 11am yest, frequent blood donor, stays hydrated, no lightheadedness, had coffee, got up from char went down, lost consciousness, tried to get up, sweating profusely, EMS called, back to baseline.  No cardiac sx prior,   orthohypotension     No follow-ups on file.   [name], MD  04/20/24     Contains text generated by Abridge.        [1] No Known Allergies [2]  Current Outpatient Medications on File Prior to Visit  Medication Sig Dispense Refill   acetaminophen  (TYLENOL ) 500 MG tablet Take 500-1,000 mg by mouth every 6 (six) hours as needed (for pain.).     albuterol  (PROAIR  HFA) 108 (90 Base) MCG/ACT inhaler Inhale 1-2 puffs into the lungs every 6 (six) hours as needed for wheezing or shortness of breath. 8.5 g 2   amoxicillin  (AMOXIL ) 500 MG capsule Take 2,000 mg by mouth as needed (before dental appts).     aspirin  EC 81 MG tablet Take 81 mg by mouth every other day. Swallow whole.     cetirizine (ZYRTEC) 10 MG tablet Take 10 mg by mouth every evening.     fexofenadine (ALLEGRA) 180 MG tablet Take 180 mg by mouth daily as needed (allergies.).     fluticasone  (FLONASE ) 50 MCG/ACT nasal spray Place 2 sprays into both nostrils daily.     ibuprofen (ADVIL) 200 MG tablet Take 600-800 mg by mouth as needed.     loratadine (CLARITIN) 10 MG tablet Take 10 mg by mouth every evening.     montelukast  (SINGULAIR ) 10 MG tablet Take 1 tablet (10 mg total) by mouth daily. 90 tablet 3   Multiple Vitamins-Minerals (CENTRUM SILVER PO) Take by mouth at bedtime.     triamcinolone  cream (KENALOG ) 0.1 % Apply 1 Application topically 2 (two) times daily as needed. 30 g 1   valsartan  (DIOVAN ) 160 MG tablet Take 1 tablet (160 mg total) by mouth daily. 90  tablet 3   benzonatate  (TESSALON ) 200 MG capsule Take 1 capsule (200 mg total) by mouth 3 (three) times daily as needed. 90 capsule 1   No current facility-administered medications on file prior to visit.   "

## 2024-04-20 NOTE — Patient Instructions (Addendum)
 Thank you for visiting [facility] today! Here's what we talked about: - Stay hydrated over these next 3 days, drink at least 30oz of water daily - Go to the ED if episode recurs or you begin to have chest pain, shortness of breath or lightheadedness

## 2024-04-20 NOTE — Telephone Encounter (Signed)
" °  FYI Only or Action Required?: Action required by provider: request for appointment.  Patient was last seen in primary care on 09/17/2023 by Cleatus Arlyss RAMAN, MD.  Called Nurse Triage reporting Loss of Consciousness.  Symptoms began yesterday.  Interventions attempted: Other: EMS saw pt..  Symptoms are: stable.Pt. States fainted last night at home. Had been sitting watching TV, stood up and passed out. Wife witnessed. Injured finger, did not hit head. Asking to be seen today. Appointment made per [facility] in the practice.  Triage Disposition: See HCP Within 4 Hours (Or PCP Triage)  Patient/caregiver understands and will follow disposition?: Yes     Reason for Triage: Passed out last night.. feels okay now.. rather be seen today   Reason for Disposition  [1] All other patients AND [2] now alert and feels fine  (Exception: SIMPLE FAINT due to stress, pain, prolonged standing, or suddenly standing)  Answer Assessment - Initial Assessment Questions 1. ONSET: How long were you unconscious? (e.g., minutes, seconds) When did it happen?     Last night 2. CONTENT: What happened during the period of unconsciousness? (e.g., seizure activity)      Stood up 3. MENTAL STATUS: Alert and oriented now? (e.g., oriented x 3 = name, month, location)      alert 4. TRIGGER: What do you think caused the fainting? What were you doing just before you fainted?  (e.g., exercise, sudden standing up, prolonged standing)     unsure 5. RECURRENT SYMPTOM: Have you ever passed out before? If Yes, ask: When was the last time? and What happened that time?      no 6. INJURY: Did you hurt yourself when you fell?      Hurt finger 7. CARDIAC SYMPTOMS: Have you had any of the following symptoms: chest pain, difficulty breathing, palpitations?     no 8. NEUROLOGIC SYMPTOMS: Have you had any of the following symptoms: headache, numbness, vertigo, weakness?     no 9. GI SYMPTOMS: Have you had any  of the following symptoms: abdomen pain, vomiting, diarrhea, blood in stools?     no 10. OTHER SYMPTOMS: Do you have any other symptoms?       no 11. PREGNANCY: Is there any chance you are pregnant? When was your last menstrual period?       N/a  Protocols used: Fainting-A-AH  "

## 2024-09-23 ENCOUNTER — Ambulatory Visit: Payer: Self-pay | Admitting: Cardiovascular Disease
